# Patient Record
Sex: Female | Born: 1940 | Race: Black or African American | Hispanic: No | Smoking: Never smoker
Health system: Southern US, Community
[De-identification: ages and names within clinical notes are randomized; demographics above are authoritative.]

## PROBLEM LIST (undated history)

## (undated) DIAGNOSIS — R001 Bradycardia, unspecified: Secondary | ICD-10-CM

## (undated) DIAGNOSIS — M109 Gout, unspecified: Secondary | ICD-10-CM

## (undated) DIAGNOSIS — N189 Chronic kidney disease, unspecified: Secondary | ICD-10-CM

## (undated) DIAGNOSIS — Z950 Presence of cardiac pacemaker: Secondary | ICD-10-CM

## (undated) DIAGNOSIS — M1990 Unspecified osteoarthritis, unspecified site: Secondary | ICD-10-CM

## (undated) DIAGNOSIS — I10 Essential (primary) hypertension: Secondary | ICD-10-CM

## (undated) DIAGNOSIS — I509 Heart failure, unspecified: Secondary | ICD-10-CM

## (undated) HISTORY — PX: PACEMAKER INSERTION: SHX728

## (undated) HISTORY — PX: KNEE SURGERY: SHX244

## (undated) HISTORY — PX: ABDOMINAL HYSTERECTOMY: SHX81

---

## 2008-12-08 DIAGNOSIS — Z950 Presence of cardiac pacemaker: Secondary | ICD-10-CM

## 2008-12-08 HISTORY — DX: Presence of cardiac pacemaker: Z95.0

## 2008-12-08 HISTORY — PX: INSERT / REPLACE / REMOVE PACEMAKER: SUR710

## 2014-11-06 ENCOUNTER — Encounter (HOSPITAL_COMMUNITY): Payer: Self-pay | Admitting: Physician Assistant

## 2014-11-06 ENCOUNTER — Encounter (HOSPITAL_COMMUNITY)
Admission: AD | Disposition: A | Discharge status: Home / Self Care | Payer: Self-pay | Source: Other Acute Inpatient Hospital | Attending: Internal Medicine

## 2014-11-06 ENCOUNTER — Inpatient Hospital Stay (HOSPITAL_COMMUNITY)
Admission: AD | Admit: 2014-11-06 | Discharge: 2014-11-08 | DRG: 280 | Disposition: A | Discharge status: Home / Self Care | Payer: PRIVATE HEALTH INSURANCE | Source: Other Acute Inpatient Hospital | Attending: Internal Medicine | Admitting: Internal Medicine

## 2014-11-06 DIAGNOSIS — Z950 Presence of cardiac pacemaker: Secondary | ICD-10-CM | POA: Diagnosis not present

## 2014-11-06 DIAGNOSIS — M109 Gout, unspecified: Secondary | ICD-10-CM | POA: Diagnosis present

## 2014-11-06 DIAGNOSIS — I429 Cardiomyopathy, unspecified: Secondary | ICD-10-CM | POA: Diagnosis present

## 2014-11-06 DIAGNOSIS — Z8249 Family history of ischemic heart disease and other diseases of the circulatory system: Secondary | ICD-10-CM

## 2014-11-06 DIAGNOSIS — I214 Non-ST elevation (NSTEMI) myocardial infarction: Secondary | ICD-10-CM | POA: Diagnosis present

## 2014-11-06 DIAGNOSIS — I272 Other secondary pulmonary hypertension: Secondary | ICD-10-CM | POA: Diagnosis present

## 2014-11-06 DIAGNOSIS — R079 Chest pain, unspecified: Secondary | ICD-10-CM

## 2014-11-06 DIAGNOSIS — I509 Heart failure, unspecified: Secondary | ICD-10-CM

## 2014-11-06 DIAGNOSIS — I129 Hypertensive chronic kidney disease with stage 1 through stage 4 chronic kidney disease, or unspecified chronic kidney disease: Secondary | ICD-10-CM | POA: Diagnosis not present

## 2014-11-06 DIAGNOSIS — N183 Chronic kidney disease, stage 3 unspecified: Secondary | ICD-10-CM | POA: Diagnosis present

## 2014-11-06 DIAGNOSIS — I5021 Acute systolic (congestive) heart failure: Secondary | ICD-10-CM | POA: Diagnosis present

## 2014-11-06 DIAGNOSIS — I493 Ventricular premature depolarization: Secondary | ICD-10-CM | POA: Diagnosis present

## 2014-11-06 DIAGNOSIS — N182 Chronic kidney disease, stage 2 (mild): Secondary | ICD-10-CM

## 2014-11-06 DIAGNOSIS — N179 Acute kidney failure, unspecified: Secondary | ICD-10-CM | POA: Diagnosis present

## 2014-11-06 DIAGNOSIS — I10 Essential (primary) hypertension: Secondary | ICD-10-CM

## 2014-11-06 DIAGNOSIS — I2510 Atherosclerotic heart disease of native coronary artery without angina pectoris: Secondary | ICD-10-CM | POA: Clinically undetermined

## 2014-11-06 HISTORY — DX: Essential (primary) hypertension: I10

## 2014-11-06 HISTORY — DX: Unspecified osteoarthritis, unspecified site: M19.90

## 2014-11-06 HISTORY — DX: Gout, unspecified: M10.9

## 2014-11-06 HISTORY — DX: Presence of cardiac pacemaker: Z95.0

## 2014-11-06 HISTORY — DX: Chronic kidney disease, unspecified: N18.9

## 2014-11-06 HISTORY — DX: Bradycardia, unspecified: R00.1

## 2014-11-06 HISTORY — PX: LEFT HEART CATHETERIZATION WITH CORONARY ANGIOGRAM: SHX5451

## 2014-11-06 LAB — CBC WITH DIFFERENTIAL/PLATELET
BASOS ABS: 0 10*3/uL (ref 0.0–0.1)
Basophils Relative: 0 % (ref 0–1)
EOS PCT: 3 % (ref 0–5)
Eosinophils Absolute: 0.2 10*3/uL (ref 0.0–0.7)
HCT: 35 % — ABNORMAL LOW (ref 36.0–46.0)
HEMOGLOBIN: 11.6 g/dL — AB (ref 12.0–15.0)
LYMPHS ABS: 2.1 10*3/uL (ref 0.7–4.0)
Lymphocytes Relative: 35 % (ref 12–46)
MCH: 28.6 pg (ref 26.0–34.0)
MCHC: 33.1 g/dL (ref 30.0–36.0)
MCV: 86.4 fL (ref 78.0–100.0)
MONO ABS: 0.6 10*3/uL (ref 0.1–1.0)
MONOS PCT: 10 % (ref 3–12)
NEUTROS ABS: 3.1 10*3/uL (ref 1.7–7.7)
Neutrophils Relative %: 52 % (ref 43–77)
Platelets: 133 10*3/uL — ABNORMAL LOW (ref 150–400)
RBC: 4.05 MIL/uL (ref 3.87–5.11)
RDW: 14.3 % (ref 11.5–15.5)
WBC: 6.1 10*3/uL (ref 4.0–10.5)

## 2014-11-06 LAB — COMPREHENSIVE METABOLIC PANEL
ALT: 26 U/L (ref 0–35)
AST: 46 U/L — ABNORMAL HIGH (ref 0–37)
Albumin: 3.8 g/dL (ref 3.5–5.2)
Alkaline Phosphatase: 80 U/L (ref 39–117)
Anion gap: 17 — ABNORMAL HIGH (ref 5–15)
BILIRUBIN TOTAL: 0.8 mg/dL (ref 0.3–1.2)
BUN: 21 mg/dL (ref 6–23)
CALCIUM: 9.9 mg/dL (ref 8.4–10.5)
CHLORIDE: 98 meq/L (ref 96–112)
CO2: 23 meq/L (ref 19–32)
CREATININE: 1.09 mg/dL (ref 0.50–1.10)
GFR, EST AFRICAN AMERICAN: 56 mL/min — AB (ref >90)
GFR, EST NON AFRICAN AMERICAN: 48 mL/min — AB (ref >90)
GLUCOSE: 94 mg/dL (ref 70–99)
Potassium: 3.6 mEq/L — ABNORMAL LOW (ref 3.7–5.3)
Sodium: 138 mEq/L (ref 137–147)
Total Protein: 7.7 g/dL (ref 6.0–8.3)

## 2014-11-06 LAB — PROTIME-INR
INR: 1.26 (ref 0.00–1.49)
PROTHROMBIN TIME: 15.9 s — AB (ref 11.6–15.2)

## 2014-11-06 LAB — URINALYSIS, ROUTINE W REFLEX MICROSCOPIC
BILIRUBIN URINE: NEGATIVE
Glucose, UA: NEGATIVE mg/dL
KETONES UR: NEGATIVE mg/dL
Nitrite: NEGATIVE
PH: 7 (ref 5.0–8.0)
Protein, ur: NEGATIVE mg/dL
Specific Gravity, Urine: 1.01 (ref 1.005–1.030)
UROBILINOGEN UA: 0.2 mg/dL (ref 0.0–1.0)

## 2014-11-06 LAB — TSH: TSH: 0.542 u[IU]/mL (ref 0.350–4.500)

## 2014-11-06 LAB — TROPONIN I
Troponin I: 2.21 ng/mL (ref <0.30)
Troponin I: 4.28 ng/mL (ref <0.30)

## 2014-11-06 LAB — URINE MICROSCOPIC-ADD ON

## 2014-11-06 LAB — MRSA PCR SCREENING: MRSA BY PCR: NEGATIVE

## 2014-11-06 LAB — PRO B NATRIURETIC PEPTIDE: PRO B NATRI PEPTIDE: 2232 pg/mL — AB (ref 0–450)

## 2014-11-06 LAB — POCT ACTIVATED CLOTTING TIME: ACTIVATED CLOTTING TIME: 153 s

## 2014-11-06 SURGERY — LEFT HEART CATHETERIZATION WITH CORONARY ANGIOGRAM
Anesthesia: LOCAL

## 2014-11-06 MED ORDER — FENTANYL CITRATE 0.05 MG/ML IJ SOLN
INTRAMUSCULAR | Status: AC
  Filled 2014-11-06: qty 2

## 2014-11-06 MED ORDER — SODIUM CHLORIDE 0.9 % IJ SOLN: 3.0000 mL | INTRAMUSCULAR | Status: DC | PRN

## 2014-11-06 MED ORDER — SODIUM CHLORIDE 0.9 % IJ SOLN: 3.0000 mL | Freq: Two times a day (BID) | INTRAMUSCULAR | Status: DC

## 2014-11-06 MED ORDER — NITROGLYCERIN IN D5W 200-5 MCG/ML-% IV SOLN
0.0000 ug/min | INTRAVENOUS | Status: DC
  Administered 2014-11-06: 30 ug/min via INTRAVENOUS

## 2014-11-06 MED ORDER — NITROGLYCERIN 0.4 MG SL SUBL: 0.4000 mg | SUBLINGUAL_TABLET | SUBLINGUAL | Status: DC | PRN

## 2014-11-06 MED ORDER — SODIUM CHLORIDE 0.9 % IV SOLN
Freq: Once | INTRAVENOUS | Status: AC
  Administered 2014-11-06: 75 mL/h via INTRAVENOUS

## 2014-11-06 MED ORDER — ASPIRIN 81 MG PO CHEW: 81.0000 mg | CHEWABLE_TABLET | ORAL | Status: DC

## 2014-11-06 MED ORDER — ASPIRIN EC 81 MG PO TBEC: 81.0000 mg | DELAYED_RELEASE_TABLET | Freq: Every day | ORAL | Status: DC

## 2014-11-06 MED ORDER — ACETAMINOPHEN 325 MG PO TABS: 650.0000 mg | ORAL_TABLET | ORAL | Status: DC | PRN

## 2014-11-06 MED ORDER — ONDANSETRON HCL 4 MG/2ML IJ SOLN: 4.0000 mg | Freq: Four times a day (QID) | INTRAMUSCULAR | Status: DC | PRN

## 2014-11-06 MED ORDER — MIDAZOLAM HCL 2 MG/2ML IJ SOLN
INTRAMUSCULAR | Status: AC
  Filled 2014-11-06: qty 2

## 2014-11-06 MED ORDER — SODIUM CHLORIDE 0.9 % IV SOLN: INTRAVENOUS | Status: DC

## 2014-11-06 MED ORDER — HEPARIN (PORCINE) IN NACL 100-0.45 UNIT/ML-% IJ SOLN
1000.0000 [IU]/h | INTRAMUSCULAR | Status: DC
  Filled 2014-11-06: qty 250

## 2014-11-06 MED ORDER — ASPIRIN 300 MG RE SUPP
300.0000 mg | RECTAL | Status: AC
  Filled 2014-11-06: qty 1

## 2014-11-06 MED ORDER — ASPIRIN 81 MG PO CHEW
324.0000 mg | CHEWABLE_TABLET | ORAL | Status: AC
  Filled 2014-11-06: qty 4

## 2014-11-06 MED ORDER — ASPIRIN EC 81 MG PO TBEC
81.0000 mg | DELAYED_RELEASE_TABLET | Freq: Every day | ORAL | Status: DC
  Administered 2014-11-07 – 2014-11-08 (×2): 81 mg via ORAL
  Filled 2014-11-06 (×2): qty 1

## 2014-11-06 MED ORDER — NITROGLYCERIN IN D5W 200-5 MCG/ML-% IV SOLN: 0.0000 ug/min | INTRAVENOUS | Status: DC

## 2014-11-06 MED ORDER — ONDANSETRON HCL 4 MG/2ML IJ SOLN
4.0000 mg | Freq: Four times a day (QID) | INTRAMUSCULAR | Status: DC | PRN
Start: 2014-11-06 — End: 2014-11-06

## 2014-11-06 MED ORDER — HEPARIN (PORCINE) IN NACL 2-0.9 UNIT/ML-% IJ SOLN
INTRAMUSCULAR | Status: AC
  Filled 2014-11-06: qty 1500

## 2014-11-06 MED ORDER — SODIUM CHLORIDE 0.9 % IV SOLN: 250.0000 mL | INTRAVENOUS | Status: DC | PRN

## 2014-11-06 MED ORDER — LIDOCAINE HCL (PF) 1 % IJ SOLN
INTRAMUSCULAR | Status: AC
  Filled 2014-11-06: qty 30

## 2014-11-06 NOTE — Interval H&P Note (Signed)
Cath Lab Visit (complete for each Cath Lab visit)  Clinical Evaluation Leading to the Procedure:   ACS: Yes.    Non-ACS:    Anginal Classification: CCS IV  Anti-ischemic medical therapy: Maximal Therapy (2 or more classes of medications)  Non-Invasive Test Results: No non-invasive testing performed  Prior CABG: No previous CABG      History and Physical Interval Note:  11/06/2014 4:38 PM  Anita Wiley  has presented today for surgery, with the diagnosis of cp  The various methods of treatment have been discussed with the patient and family. After consideration of risks, benefits and other options for treatment, the patient has consented to  Procedure(s): LEFT HEART CATHETERIZATION WITH CORONARY ANGIOGRAM (N/A) as a surgical intervention .  The patient's history has been reviewed, patient examined, no change in status, stable for surgery.  I have reviewed the patient's chart and labs.  Questions were answered to the patient's satisfaction.     Anmol Paschen A

## 2014-11-06 NOTE — Progress Notes (Signed)
PA  From [facility] here to see and interview pt. Aware that pt is still having active chest pain rated a 4 on scale of 1 to 10.

## 2014-11-06 NOTE — Progress Notes (Signed)
DR Hilty here wanted NTG increased to 50 Mcg/ min for active CP mid sternal.

## 2014-11-06 NOTE — Progress Notes (Signed)
Risk and benefit of cardiac catheterization (bleeding, vascular/renal injury, arrhythmia, MI, stroke, loss of limb or life) discussed with patient who has agreed to proceed with the procedure.  Signed, Torrey Ballinas PA Pager: [phone]  

## 2014-11-06 NOTE — CV Procedure (Signed)
Anita Wiley is a 73 y.o. female    [id number]  [id number] LOCATION:  FACILITY: [facility]  PHYSICIAN: Lennette Biharihomas A. Gulianna Hornsby, MD, [facility] 10/13/1941   DATE OF PROCEDURE:  11/06/2014    CARDIAC CATHETERIZATION     HISTORY:    Anita Wiley is a 73 y.o. 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  The patient was [facility] after [facility] with substernal chest pain nausea and vomiting.  Initial troponin was mildly elevated at 0.3 at [facility].  Subsequent troponin at [facility] was 2.21 .  She is now referred for definitive cardiac catheterization.   PROCEDURE: Left heart catheterization: coronary angiography, left ventriculography   The patient was brought to [facility] cardiac catherization laboratory in the fasting state.  She was premedicated with Versed 1 mg and fentanyl 25 g. Her right groin was prepped and shaved in usual sterile fashion. Xylocaine 1% was used for local anesthesia. A 5 French sheath was inserted into the R femoral artery. Diagnostic catheterizatiion was done with 5 [place] FL5, FR4, and pigtail catheters. Left ventriculography was done with 25 cc Omnipaque contrast. Hemostasis was obtained by direct manual compression. The patient tolerated the procedure well.   HEMODYNAMICS:   Central Aorta:  144/59   Left Ventricle:  144/17  ANGIOGRAPHY:  Left main: Normal vessel which trifurcated into an LAD, ramus intermediate vessel, a left circumflex coronary artery.  LAD: Angiographically normal vessel which gave rise to one large proximal diagonal vessel and several septal perforating arteries.  The LAD extended to the LV apex.  Ramus Intermediate: Angiographically normal moderate sized vessel.  Left circumflex: Angiographically normal gave rise to a large bifurcating first marginal branch very proximally.  The AV groove circumflex was small caliber and gave rise to 2 small distal  branches.  Right coronary artery: Large dominant normal vessel which gave rise to a large PDA and posterolateral vessel as well as a large proximal branch.  Left ventriculography revealed normal to low normal LV function with an ejection fraction at 50-55%.  There were several PVCs.  No definitive wall motion abnormalities were demonstrated.  Probable left ventricular hypertrophy.   IMPRESSION:  Normal to low normal LV function with an ejection fraction at 50-55%.  There is evidence for probable left ventricular hypertrophy.  Normal coronary arteries.  RECOMMENDATION:   Medical therapy with optimal blood pressure control.  The patient arrived in the catheterization laboratory on 56 g of iv nitroglycerin.  This will be weaned and DC'd as BP allows with increasing medical therapy for blood pressure control.  Her mild troponin elevation most likely represents demand ischemia rather than a non-ST segment elevation myocardial infarction.  Lennette Biharihomas A. Magaret Justo, MD, [facility] 11/06/2014 5:27 PM

## 2014-11-06 NOTE — Progress Notes (Signed)
Text paged critical value to Azalee CourseHao Meng, [place] [facility] troponin was 2.21

## 2014-11-06 NOTE — Progress Notes (Addendum)
Pt in cath holding pre procedure.Tx by Rn with cardiac monitor. Pt reports cp at 3/10, improving from earlier, described as "Just a Little" O2 at 2L via [place] with 100 sat. Paced at 60bpm. IV NTG at 56.67 mcg/min, NS at 75 cc/h , heparin continues at 10cc/h.  1000U/H

## 2014-11-06 NOTE — H&P (Signed)
Patient ID: Anita Wiley [name]: [id number], DOB/AGE: 75/01/1941   Admit date: 11/06/2014   Primary Physician: No primary care provider on file. Primary Cardiologist: Dr. Tasia C[place]ingsAhmed in [place]  Pt. Profile:  73 year old African-American female with past medical history of hypertension, gout, history of bradycardia status post pacemaker placement in 2010 presented with persistent CP with elevated trop and CK-MB. Transfer[facility]  Problem List  Past Medical History  Diagnosis Date  . Hypertension   . Bradycardia     s/p pacemaker in 2010  . Chronic kidney disease   . Gout   . Pacemaker     Past Surgical History  Procedure Laterality Date  . Pacemaker insertion      2010  . Knee surgery    . Abdominal hysterectomy      at age 73     Allergies  Allergies  Allergen Reactions  . Codeine Rash    HPI  The patient is a 73 year old African-American female with past medical history of hypertension, gout, history of bradycardia status post pacemaker placement in 2010. According to the patient, she had a cardiac catheter[place]. She stated, she was pending knee surgery at that time, however was told there was something concerning about her EKG. She was later told her cardiac catheterization was fine at the time and underwent knee surgery the following year. She denies any prior history of heart attack. She received a pacemaker in 2010 for slow heart rate in the 30s. Her primary cardiologist have urged her several times to do a stress test, however she stated she could not walk on treadmill. It is unclear as this time whether or not she had a chemical stress test before as the patient is a poor historian. She states, her father died when she was 6 due to unclear illness. Her sister had coronary artery disease, however she is not sure how old her sister was when she had the first heart attack. She states she has not been very active at home. She  walks around with a walker, she has not noted any significant exertional symptom. She denies any heart failure symptoms include lower extremity edema, orthopnea or paroxysmal nocturnal dyspnea. There has been no fever, chill, or recent bleeding problem.  She woke up around midnight in the morning of 11/06/2014 with substernal/epigastric pain, nonradiating, nausea/vomiting, and diaphoresis. She also endorsed some mild shortness of breath as well. [facility], initial EKG showed nonspecific ST-T wave changes. She was also noted to have systolic blood pressure in the 190s. Laboratory finding include potassium of 3.3, BNP of 407, elevated troponin, CK-MB and total CK. Chest x-ray showed vascular congestion, cardiomegaly, bilateral PCP concerning for mild interstitial edema. Patient was subsequently [facility] as NSTEMI. At this time, patient continued to have 4/10 chest pain, which has been persistent since this morning.   Of note, she denies any bleeding or upcoming surgeries.   Home Medications  Prior to Admission medications   Not on File    Family History  Family History  Problem Relation Age of Onset  . Hypertension Mother   . CAD Sister     unkown age    Social History  History   Social History  . Marital Status: Widowed    Spouse Name: N/A    Number of Children: N/A  . Years of Education: N/A   Occupational History  . Not on file.   Social History  Main Topics  . Smoking status: Never Smoker   . Smokeless tobacco: Not on file  . Alcohol Use: No  . Drug Use: No  . Sexual Activity: Not on file   Other Topics Concern  . Not on file   Social History Narrative  . No narrative on file     Review of Systems General:  No chills, fever, night sweats or weight changes.  Cardiovascular:  No dyspnea on exertion, edema, orthopnea, palpitations, paroxysmal nocturnal dyspnea. + chest pain Dermatological: No rash,  lesions/masses Respiratory: No cough +dyspnea Urologic: No hematuria, dysuria Abdominal:   No nausea, vomiting, diarrhea, bright red blood per rectum, melena, or hematemesis Neurologic:  No visual changes, wkns, changes in mental status. All other systems reviewed and are otherwise negative except as noted above.  Physical Exam  Blood pressure 171/81, pulse 61, temperature 98.4 F (36.9 C), temperature source Oral, height 5\' 5"  (1.651 m), weight 183 lb 13.8 oz (83.4 kg), SpO2 100 %.  General: Pleasant, NAD Psych: Normal affect. Neuro: Alert and oriented X 3. Moves all extremities spontaneously. HEENT: Normal  Neck: Supple without bruits or JVD. Lungs:  Resp regular and unlabored, decreased breath sound in bilateral bases Heart: RRR no s3, s4, or murmurs. Abdomen: Soft, non-tender, non-distended, BS + x 4.  Extremities: No clubbing, cyanosis or edema. DP/PT/Radials 2+ and equal bilaterally.  Labs   ECG  Pending   ASSESSMENT AND PLAN  1. NSTEMI  - [place], no intervention  - order EKG, trend trop. Continue IV heparin and nitro gtt  - will arrange cardiac cath today as patient has been NPO  2. Possible HF  - [facility]  2. HTN: uncontrolled  - possibly related to CP  3. Gout 4. CKD, stage III 5. H/o bradycardia s/p pacemaker placement 2010  - [employer]  Signed, Azalee CourseMeng, Jovana Rembold, PA-C 11/06/2014, 11:27 AM

## 2014-11-06 NOTE — Progress Notes (Signed)
ANTICOAGULATION CONSULT NOTE - Initial Consult  Pharmacy Consult for heparin Indication: chest pain/ACS  Allergies  Allergen Reactions  . Codeine Rash    Patient Measurements: Height: 5\' 5"  (165.1 cm) Weight: 183 lb 13.8 oz (83.4 kg) IBW/kg (Calculated) : 57 Heparin Dosing Weight: 67.6 kg  Vital Signs: Temp: 98.4 F (36.9 C) (11/30 1039) Temp Source: Oral (11/30 1039) BP: 171/81 mmHg (11/30 1039) Pulse Rate: 61 (11/30 1039)  Labs: No results for input(s): HGB, HCT, PLT, APTT, LABPROT, INR, HEPARINUNFRC, CREATININE, CKTOTAL, CKMB, TROPONINI in the last 72 hours.  CrCl cannot be calculated (Patient has no serum creatinine result on file.).   Medical History: Past Medical History  Diagnosis Date  . Hypertension   . Bradycardia     s/p pacemaker in 2010  . Chronic kidney disease   . Gout   . Pacemaker     Medications:  Prescriptions prior to admission  Medication Sig Dispense Refill Last Dose  . allopurinol (ZYLOPRIM) 300 MG tablet Take 300 mg by mouth daily.   11/05/2014 at Unknown time  . aspirin 81 MG tablet Take 81 mg by mouth daily.   11/05/2014 at Unknown time  . valsartan-hydrochlorothiazide (DIOVAN-HCT) 160-25 MG per tablet Take 1 tablet by mouth daily.   11/05/2014 at Unknown time    Assessment: 73 yo F [facility] with [facility].  [facility] with N/V and epigastric pain.  Initial EKG showed nonspecific ST-T wave changes. She has elevated troponin, CK-MB and total CK. Her stated wt is 87 kg from MD visit in September and her measured wt is 83.4 kg.  Ht 65 inches.  Baseline INR 1.1, creat 1.12, H/H 11.2/35.6.  Her platelet count is low at 125.  No bleeding reported.  She was started on a heparin drip at 1000 units/hr [facility] at 0730 am today.  I did not see a bolus dose in the [facility] records.    Goal of Therapy:  Heparin level 0.3-0.7 units/ml Monitor platelets by anticoagulation protocol: Yes   Plan:  -continue heparin drip at  1000 units/hr as [facility] and check HL 8 hrs after started, which will be at 1530. -daily HL and CBC while on heparin -f/u after cath  Herby AbrahamMichelle T. Jourdan Durbin, Pharm.D. [phone] 11/06/2014 11:48 AM

## 2014-11-06 NOTE — Progress Notes (Signed)
To Cath Lab procedure 5

## 2014-11-06 NOTE — Plan of Care (Signed)
Problem: Consults Goal: Chest Pain Patient Education (See Patient Education module for education specifics.)  Outcome: Completed/Met Date Met:  11/06/14  Problem: Phase I Progression Outcomes Goal: Aspirin unless contraindicated Outcome: Completed/Met Date Met:  11/06/14 Goal: MD aware of Cardiac Marker results Outcome: Completed/Met Date Met:  11/06/14 Goal: Voiding-avoid urinary catheter unless indicated Outcome: Completed/Met Date Met:  11/06/14

## 2014-11-06 NOTE — Progress Notes (Signed)
[facility] Ntg gtt at 30mg /mion Heparin at 1,000 u /hr via Lt fa IV. Alert/oriented C/o chest pain on scale of 1 to 10 a 4 CHG and MRSA swab done

## 2014-11-06 NOTE — Progress Notes (Signed)
Utilization Review Completed.  

## 2014-11-06 NOTE — H&P (View-Only) (Signed)
Risk and benefit of cardiac catheterization (bleeding, vascular/renal injury, arrhythmia, MI, stroke, loss of limb or life) discussed with patient who has agreed to proceed with the procedure.  Ramond DialSigned, Drey Shaff PA Pager: [phone]

## 2014-11-06 NOTE — Progress Notes (Signed)
Site area: RFA Site Prior to Removal:  Level 0 Pressure Applied For:2125min Manual: y   Patient Status During Pull:  stable Post Pull Site:  Level 0 Post Pull Instructions Given:  yes Post Pull Pulses Present: palpable Dressing Applied:  clear Bedrest begins @ 1800 Comments:no complications

## 2014-11-06 NOTE — Plan of Care (Signed)
Problem: Phase II Progression Outcomes Goal: Cath/PCI Day Path if indicated Outcome: Completed/Met Date Met:  11/06/14

## 2014-11-06 NOTE — Plan of Care (Signed)
Problem: Consults Goal: Cardiac Cath Patient Education (See Patient Education module for education specifics.)  Outcome: Completed/Met Date Met:  11/06/14     

## 2014-11-07 ENCOUNTER — Inpatient Hospital Stay (HOSPITAL_COMMUNITY): Payer: PRIVATE HEALTH INSURANCE

## 2014-11-07 DIAGNOSIS — I369 Nonrheumatic tricuspid valve disorder, unspecified: Secondary | ICD-10-CM

## 2014-11-07 DIAGNOSIS — I2510 Atherosclerotic heart disease of native coronary artery without angina pectoris: Secondary | ICD-10-CM

## 2014-11-07 LAB — LIPID PANEL
CHOL/HDL RATIO: 2.7 ratio
Cholesterol: 151 mg/dL (ref 0–200)
HDL: 56 mg/dL (ref >39)
LDL CALC: 84 mg/dL (ref 0–99)
Triglycerides: 55 mg/dL (ref <150)
VLDL: 11 mg/dL (ref 0–40)

## 2014-11-07 LAB — CBC
HCT: 30.6 % — ABNORMAL LOW (ref 36.0–46.0)
Hemoglobin: 9.7 g/dL — ABNORMAL LOW (ref 12.0–15.0)
MCH: 27.6 pg (ref 26.0–34.0)
MCHC: 31.7 g/dL (ref 30.0–36.0)
MCV: 86.9 fL (ref 78.0–100.0)
PLATELETS: 123 10*3/uL — AB (ref 150–400)
RBC: 3.52 MIL/uL — AB (ref 3.87–5.11)
RDW: 14.5 % (ref 11.5–15.5)
WBC: 6.1 10*3/uL (ref 4.0–10.5)

## 2014-11-07 LAB — BASIC METABOLIC PANEL
ANION GAP: 13 (ref 5–15)
BUN: 21 mg/dL (ref 6–23)
CALCIUM: 9.2 mg/dL (ref 8.4–10.5)
CO2: 25 mEq/L (ref 19–32)
Chloride: 102 mEq/L (ref 96–112)
Creatinine, Ser: 1.19 mg/dL — ABNORMAL HIGH (ref 0.50–1.10)
GFR, EST AFRICAN AMERICAN: 50 mL/min — AB (ref >90)
GFR, EST NON AFRICAN AMERICAN: 44 mL/min — AB (ref >90)
Glucose, Bld: 110 mg/dL — ABNORMAL HIGH (ref 70–99)
POTASSIUM: 3.6 meq/L — AB (ref 3.7–5.3)
SODIUM: 140 meq/L (ref 137–147)

## 2014-11-07 LAB — TROPONIN I: Troponin I: 4.56 ng/mL (ref <0.30)

## 2014-11-07 MED FILL — Nitroglycerin IV Soln 200 MCG/ML in D5W: INTRAVENOUS | Qty: 250 | Status: AC

## 2014-11-07 MED FILL — Heparin Sodium (Porcine) 100 Unt/ML in Sodium Chloride 0.45%: INTRAMUSCULAR | Qty: 250 | Status: AC

## 2014-11-07 NOTE — Plan of Care (Signed)
Problem: Phase I Progression Outcomes Goal: Distal pulses equal to baseline Outcome: Completed/Met Date Met:  11/07/14 Goal: Vascular site scale level 0 - I Vascular Site Scale Level 0: No bruising/bleeding/hematoma Level I (Mild): Bruising/Ecchymosis, minimal bleeding/ooozing, palpable hematoma < 3 cm Level II (Moderate): Bleeding not affecting hemodynamic parameters, pseudoaneurysm, palpable hematoma > 3 cm Level III (Severe) Bleeding which affects hemodynamic parameters or retroperitoneal hemorrhage  Outcome: Completed/Met Date Met:  11/07/14     

## 2014-11-07 NOTE — Progress Notes (Addendum)
Patient Name: Anita Wiley Date of Encounter: 11/07/2014     Principal Problem:   NSTEMI (non-ST elevated myocardial infarction) Active Problems:   S/P placement of cardiac pacemaker   HTN (hypertension), malignant   CKD (chronic kidney disease) stage 2, GFR 60-89 ml/min   Coronary artery disease, non-occlusive by cath    SUBJECTIVE  Patient feeling well. No CP or SOB.   CURRENT MEDS . aspirin  324 mg Oral NOW   Or  . aspirin  300 mg Rectal NOW  . aspirin EC  81 mg Oral Daily    OBJECTIVE  Filed Vitals:   11/06/14 2230 11/07/14 0000 11/07/14 0100 11/07/14 0647  BP: 131/73 137/50 124/52 135/63  Pulse:  63  67  Temp:  98.3 F (36.8 C)  98.4 F (36.9 C)  TempSrc:  Oral  Oral  Resp:  16  20  Height:      Weight:  185 lb 3 oz (84 kg)    SpO2:  96%  95%    Intake/Output Summary (Last 24 hours) at 11/07/14 0721 Last data filed at 11/07/14 0253  Gross per 24 hour  Intake   1048 ml  Output    780 ml  Net    268 ml   Filed Weights   11/06/14 1039 11/07/14 0000  Weight: 183 lb 13.8 oz (83.4 kg) 185 lb 3 oz (84 kg)    PHYSICAL EXAM  General: Pleasant, NAD. Neuro: Alert and oriented X 3. Moves all extremities spontaneously. Psych: Normal affect. HEENT:  Normal  Neck: Supple without bruits or JVD. Lungs:  Decreased breath sounds at bases. Heart: RRR no s3, s4, or murmurs. Abdomen: Soft, non-tender, non-distended, BS + x 4.  Extremities: No clubbing, cyanosis or edema. DP/PT/Radials 2+ and equal bilaterally.  Accessory Clinical Findings  CBC  Recent Labs  11/06/14 1255 11/07/14 0330  WBC 6.1 6.1  NEUTROABS 3.1  --   HGB 11.6* 9.7*  HCT 35.0* 30.6*  MCV 86.4 86.9  PLT 133* 123*   Basic Metabolic Panel  Recent Labs  11/06/14 1255 11/07/14 0330  NA 138 140  K 3.6* 3.6*  CL 98 102  CO2 23 25  GLUCOSE 94 110*  BUN 21 21  CREATININE 1.09 1.19*  CALCIUM 9.9 9.2   Liver Function Tests  Recent Labs  11/06/14 1255  AST 46*  ALT 26    ALKPHOS 80  BILITOT 0.8  PROT 7.7  ALBUMIN 3.8    Cardiac Enzymes  Recent Labs  11/06/14 1255 11/06/14 1939 11/06/14 2304  TROPONINI 2.21* 4.28* 4.56*    Fasting Lipid Panel  Recent Labs  11/07/14 0330  CHOL 151  HDL 56  LDLCALC 84  TRIG 55  CHOLHDL 2.7   Thyroid Function Tests  Recent Labs  11/06/14 1255  TSH 0.542    TELE  AV Paced   Radiology/Studies  [facility] 11/06/14 IMPRESSION: Normal to low normal LV function with an ejection fraction at 50-55%. There is evidence for probable left ventricular hypertrophy. Normal coronary arteries. RECOMMENDATION:  Medical therapy with optimal blood pressure control. The patient arrived in the catheterization laboratory on 56 g of iv nitroglycerin. This will be weaned and DC'd as BP allows with increasing medical therapy for blood pressure control. Her mild troponin elevation most likely represents demand ischemia rather than a non-ST segment elevation myocardial infarction.   ASSESSMENT AND PLAN  Anita Wiley is a 73 y.o. female with a history of hypertension, gout, history of bradycardia s/p PPM  2010 presented who [facility] on 11/06/14 for persistent CP with elevated trop.  Elevated troponin/demand ischemia-  -- Troponin 2.21--> 4.28--> 4.56 -- s/p LHC on 11/06/14 with normal coronary arteries; low normal LV function (EF 50-55%) and LVH. Medical therapy with optimal BP controlled recommended and elevated troponin thought to be demand ischemia vs NSTEMI.   CHF- BNP 2.2K, [facility] with pulm vasc congestion.  -- EF low-normal per LV gram on cath (EF 50-55%) -- No s/s volume overload. Elevated BNP. Will proceed with 2D ECHO and CXR today   Hx of bradycardia- s/p Metronic pacemaker   HTN- takes valsartan- HCTZ at home.  -- Will hold post cath, creat mildly bumped 1.09--> 1.19 -- BP controlled currently  Signed, Janetta HoraHOMPSON, KATHRYN R PA-C  Pager [phone]  I have seen  and evaluated the patient this AM along with Janee MornHOMPSON, KATHRYN R PA-C.  We reviewed the chart & all available data.  I agree with her findings, examination as well as impression recommendations.  Admitted with CP & SOB - ruled in for NSTEMI, but angiographically no significant disease.  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 Demand Ischemia mediated infarction.  Needs additional BP & CRF management including Echo with assessment of Diastolic function.  Hold off on titrating BP meds until renal Fxn stable post-cath.   Marykay LexHARDING,DAVID W, M.D., M.S. Interventional Cardiologist   Pager # [phone]

## 2014-11-07 NOTE — Progress Notes (Signed)
  Echocardiogram 2D Echocardiogram has been performed.  Anita Wiley 11/07/2014, 11:38 AM

## 2014-11-07 NOTE — Plan of Care (Signed)
Problem: Phase II Progression Outcomes Goal: Hemodynamically stable Outcome: Completed/Met Date Met:  11/07/14 Goal: Anginal pain relieved Outcome: Completed/Met Date Met:  11/07/14 Goal: Stress Test if indicated Outcome: Not Applicable Date Met:  94/76/54 Goal: CV Risk Factors identified Outcome: Completed/Met Date Met:  11/07/14 Goal: Cardiac Rehab if ordered Outcome: Not Applicable Date Met:  65/03/54 Goal: If positive for MI, change to MI Path Outcome: Not Applicable Date Met:  65/68/12

## 2014-11-07 NOTE — Plan of Care (Signed)
Problem: Phase II Progression Outcomes Goal: Ambulates up to 600 ft. in hall x 1 Outcome: Completed/Met Date Met:  11/07/14

## 2014-11-07 NOTE — Plan of Care (Signed)
Problem: Phase III Progression Outcomes Goal: Hemodynamically stable Outcome: Completed/Met Date Met:  11/07/14 Goal: No anginal pain Outcome: Completed/Met Date Met:  11/07/14 Goal: Cath/PCI Path as indicated Outcome: Completed/Met Date Met:  11/07/14 Goal: Vascular site scale level 0 - I Vascular Site Scale Level 0: No bruising/bleeding/hematoma Level I (Mild): Bruising/Ecchymosis, minimal bleeding/ooozing, palpable hematoma < 3 cm Level II (Moderate): Bleeding not affecting hemodynamic parameters, pseudoaneurysm, palpable hematoma > 3 cm Level III (Severe) Bleeding which affects hemodynamic parameters or retroperitoneal hemorrhage  Outcome: Completed/Met Date Met:  11/07/14 Goal: Discharge plan remains appropriate-arrangements made Outcome: Completed/Met Date Met:  11/07/14 Goal: Tolerating diet Outcome: Completed/Met Date Met:  11/07/14 Goal: If positive for MI, change to MI Path Outcome: Not Applicable Date Met:  01/56/15

## 2014-11-07 NOTE — Plan of Care (Signed)
Problem: Phase I Progression Outcomes Goal: Post Cath/PCI return to appropriate Path Outcome: Completed/Met Date Met:  11/07/14

## 2014-11-07 NOTE — Plan of Care (Signed)
Problem: Phase II Progression Outcomes Goal: Discharge plan in place and appropriate Outcome: Completed/Met Date Met:  11/07/14 Goal: Hemodynamically stable Outcome: Completed/Met Date Met:  37/35/78 Goal: Complications resolved/controlled Outcome: Completed/Met Date Met:  11/07/14 Goal: Activity appropriate for discharge plan Outcome: Completed/Met Date Met:  11/07/14 Goal: Vascular site scale level 0 - I Vascular Site Scale Level 0: No bruising/bleeding/hematoma Level I (Mild): Bruising/Ecchymosis, minimal bleeding/ooozing, palpable hematoma < 3 cm Level II (Moderate): Bleeding not affecting hemodynamic parameters, pseudoaneurysm, palpable hematoma > 3 cm Level III (Severe) Bleeding which affects hemodynamic parameters or retroperitoneal hemorrhage  Outcome: Completed/Met Date Met:  11/07/14

## 2014-11-08 ENCOUNTER — Encounter (HOSPITAL_COMMUNITY): Payer: Self-pay | Admitting: Physician Assistant

## 2014-11-08 DIAGNOSIS — M109 Gout, unspecified: Secondary | ICD-10-CM | POA: Diagnosis present

## 2014-11-08 LAB — CBC
HCT: 31 % — ABNORMAL LOW (ref 36.0–46.0)
Hemoglobin: 10.1 g/dL — ABNORMAL LOW (ref 12.0–15.0)
MCH: 28.9 pg (ref 26.0–34.0)
MCHC: 32.6 g/dL (ref 30.0–36.0)
MCV: 88.8 fL (ref 78.0–100.0)
PLATELETS: 110 10*3/uL — AB (ref 150–400)
RBC: 3.49 MIL/uL — ABNORMAL LOW (ref 3.87–5.11)
RDW: 14.5 % (ref 11.5–15.5)
WBC: 8.2 10*3/uL (ref 4.0–10.5)

## 2014-11-08 LAB — BASIC METABOLIC PANEL
ANION GAP: 10 (ref 5–15)
BUN: 18 mg/dL (ref 6–23)
CHLORIDE: 102 meq/L (ref 96–112)
CO2: 28 mEq/L (ref 19–32)
CREATININE: 1.15 mg/dL — AB (ref 0.50–1.10)
Calcium: 9.2 mg/dL (ref 8.4–10.5)
GFR calc non Af Amer: 45 mL/min — ABNORMAL LOW (ref >90)
GFR, EST AFRICAN AMERICAN: 53 mL/min — AB (ref >90)
Glucose, Bld: 100 mg/dL — ABNORMAL HIGH (ref 70–99)
Potassium: 3.9 mEq/L (ref 3.7–5.3)
Sodium: 140 mEq/L (ref 137–147)

## 2014-11-08 MED ORDER — HYDRALAZINE HCL 25 MG PO TABS
25.0000 mg | ORAL_TABLET | Freq: Three times a day (TID) | ORAL | Status: DC
  Filled 2014-11-08 (×4): qty 1

## 2014-11-08 MED ORDER — CARVEDILOL 6.25 MG PO TABS: 6.2500 mg | ORAL_TABLET | Freq: Two times a day (BID) | ORAL | Status: DC

## 2014-11-08 MED ORDER — CARVEDILOL 6.25 MG PO TABS
6.2500 mg | ORAL_TABLET | Freq: Two times a day (BID) | ORAL | Status: DC
  Administered 2014-11-08: 10:00:00 6.25 mg via ORAL
  Filled 2014-11-08 (×3): qty 1

## 2014-11-08 MED ORDER — FUROSEMIDE 10 MG/ML IJ SOLN: 40.0000 mg | Freq: Once | INTRAMUSCULAR | Status: DC

## 2014-11-08 NOTE — Progress Notes (Signed)
Patient Name: Anita Wiley Date of Encounter: 11/08/2014     Principal Problem:   NSTEMI (non-ST elevated myocardial infarction) Active Problems:   S/P placement of cardiac pacemaker   HTN (hypertension), malignant   CKD (chronic kidney disease) stage 2, GFR 60-89 ml/min   Coronary artery disease, non-occlusive by cath    SUBJECTIVE  Patient feeling well. No CP, SOB or orthopnea. However, sleeping on two pillows with bed on an incline.   CURRENT MEDS . aspirin EC  81 mg Oral Daily    OBJECTIVE  Filed Vitals:   11/07/14 2025 11/07/14 2333 11/08/14 0446 11/08/14 0725  BP: 136/54 165/71 167/55 172/65  Pulse: 69  64   Temp: 98.9 F (37.2 C) 98.5 F (36.9 C) 98.6 F (37 C)   TempSrc: Oral  Oral   Resp: 18 20 20 20   Height:      Weight:   186 lb 8.2 oz (84.6 kg)   SpO2: 96% 96% 96%     Intake/Output Summary (Last 24 hours) at 11/08/14 0813 Last data filed at 11/07/14 2340  Gross per 24 hour  Intake    480 ml  Output    350 ml  Net    130 ml   Filed Weights   11/06/14 1039 11/07/14 0000 11/08/14 0446  Weight: 183 lb 13.8 oz (83.4 kg) 185 lb 3 oz (84 kg) 186 lb 8.2 oz (84.6 kg)    PHYSICAL EXAM  General: Pleasant, NAD. Neuro: Alert and oriented X 3. Moves all extremities spontaneously. Psych: Normal affect. HEENT:  Normal  Neck: Supple without bruits or JVD. Lungs:  Decreased breath sounds at bases. Heart: RRR no s3, s4, + murmurs. Abdomen: Soft, non-tender, non-distended, BS + x 4.  Extremities: No clubbing, cyanosis or edema. DP/PT/Radials 2+ and equal bilaterally.  Accessory Clinical Findings  CBC  Recent Labs  11/06/14 1255 11/07/14 0330 11/08/14 0434  WBC 6.1 6.1 8.2  NEUTROABS 3.1  --   --   HGB 11.6* 9.7* 10.1*  HCT 35.0* 30.6* 31.0*  MCV 86.4 86.9 88.8  PLT 133* 123* 110*   Basic Metabolic Panel  Recent Labs  11/06/14 1255 11/07/14 0330  NA 138 140  K 3.6* 3.6*  CL 98 102  CO2 23 25  GLUCOSE 94 110*  BUN 21 21  CREATININE  1.09 1.19*  CALCIUM 9.9 9.2   Liver Function Tests  Recent Labs  11/06/14 1255  AST 46*  ALT 26  ALKPHOS 80  BILITOT 0.8  PROT 7.7  ALBUMIN 3.8    Cardiac Enzymes  Recent Labs  11/06/14 1255 11/06/14 1939 11/06/14 2304  TROPONINI 2.21* 4.28* 4.56*    Fasting Lipid Panel  Recent Labs  11/07/14 0330  CHOL 151  HDL 56  LDLCALC 84  TRIG 55  CHOLHDL 2.7   Thyroid Function Tests  Recent Labs  11/06/14 1255  TSH 0.542    TELE  AV Paced with frequent PVCs  Radiology/Studies  LHC 11/06/14 IMPRESSION: Normal to low normal LV function with an ejection fraction at 50-55%. There is evidence for probable left ventricular hypertrophy. Normal coronary arteries. RECOMMENDATION:  Medical therapy with optimal blood pressure control. The patient arrived in the catheterization laboratory on 56 g of iv nitroglycerin. This will be weaned and DC'd as BP allows with increasing medical therapy for blood pressure control. Her mild troponin elevation most likely represents demand ischemia rather than a non-ST segment elevation myocardial infarction.   ASSESSMENT AND PLAN  Anita Wiley is  a 73 y.o. female with a history of hypertension, gout, history of bradycardia s/p PPM 2010 who [facility]14 for persistent CP with elevated trop.  Elevated troponin/demand ischemia-  -- Troponin 2.21--> 4.28--> 4.56 -- Admitted with CP & SOB - ruled in for NSTEMI, but angiographically no significant disease.  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 Demand Ischemia mediated infarction.  Acute systolic CHF/pulmonary HTN- BNP 2.2K, [facility] with pulm vasc congestion. Repeat CXR here shows pulm vascular congestion and cardiomegaly -- EF low-normal per LV gram on cath (EF 50-55%). -- BNP elevated. 2D ECHO yesterday with EF 40-45%, severe LVH, normal LV diastolic function. Mild AR. Mod MR. Severe TR. Mod  PR. PA pk pressure 65mmg Hg. -- Patient is not SOB and has no s/s volume overload. She may need some lasix. WIll give 40mg  IV Lasix now. MD to see.  -- I have added carvedilol today and will resume home valsartan-HCTZ if kidney function stable.  Hx of bradycardia- s/p Metronic pacemaker   HTN- takes valsartan- HCTZ at home.  -- Will hold post cath, creat mildly bumped 1.09--> 1.19. BMET today pending. Resume home meds if creat stable. -- BP uncontrolled currently.  Will add Carvedilol now in the setting of mild cardiomyopathy   Dispo- she is followed by Dr. Tasia CatchingsAhmed in [place].   Billy FischerSigned, THOMPSON, KATHRYN R PA-C Pager [phone] Patient seen and examined. I agree with the assessment and plan as detailed above. See also my additional thoughts below.   I agree completely with the full note as outlined by Ms. Thompson above. The patient is ready to go home. It will be important for her to have early post hospital follow-up with her doctor in [place].  Willa RoughJeffrey Garon Melander, MD, [facility] 11/08/2014 9:38 AM

## 2014-11-08 NOTE — Discharge Instructions (Signed)

## 2014-11-08 NOTE — Care Management Note (Signed)
    Page 1 of 1   11/08/2014     9:36:14 AM CARE MANAGEMENT NOTE 11/08/2014  Patient:  Anita Wiley,Anita Wiley   Account Number:  [id number]  Date Initiated:  11/08/2014  Documentation initiated by:  GRAVES-BIGELOW,Jeannifer Drakeford  Subjective/Objective Assessment:   Pt admitted for NStemi.     Action/Plan:   DME rolling walker will be delivered to pt's room before [facility].   Anticipated DC Date:  11/09/2014   Anticipated DC Plan:  HOME/SELF CARE      DC Planning Services  CM consult      PAC Choice  DURABLE MEDICAL EQUIPMENT   Choice offered to / List presented to:     DME arranged  [facility]      DME agency  [facility]        Status of service:  Completed, signed off Medicare Important Message given?   (If response is "NO", the following Medicare IM given date fields will be blank) Date Medicare IM given:   Medicare IM given by:   Date Additional Medicare IM given:   Additional Medicare IM given by:    Discharge Disposition:  HOME/SELF CARE  Per UR Regulation:  Reviewed for med. necessity/level of care/duration of stay  If discussed at Long Length of Stay Meetings, dates discussed:    Comments:

## 2014-11-08 NOTE — Discharge Summary (Signed)
Discharge Summary   Patient ID: Anita Wiley [name]: [id number], DOB/AGE: [birth year] 73 y.o. Admit date: 11/06/2014 D/C date:     11/08/2014  Primary Cardiologist: Dr. Tasia Catchings in [place], [place]  Principal Problem:   NSTEMI (non-ST elevated myocardial infarction) Active Problems:   S/P placement of cardiac pacemaker   HTN (hypertension), malignant   CKD (chronic kidney disease) stage 2, GFR 60-89 ml/min   Coronary artery disease, non-occlusive by cath   Gout    Admission Dates: 11/06/14-11/08/14 Discharge Diagnosis: NSTEMI s/p LHC with no significant disease. Likely due to uncontrolled HTN or acute CHF  HPI: Anita Wiley is a 73 y.o. female with a history of hypertension, gout, history of bradycardia s/p PPM 2010 who was transferred to [facility] from [facility] on 11/06/14 for persistent CP with elevated trop.   Hospital Course  Elevated troponin/demand ischemia-  -- Troponin 2.21--> 4.28--> 4.56 -- Admitted with CP & SOB - ruled in for NSTEMI, but angiographically no significant disease. Likely Malignant (accelerated) HTN related/ CHF leading to Type 2 Supply vs. Demand Ischemia mediated infarction.  Acute systolic CHF/pulmonary HTN- BNP 2.2K, CXR at [facility] with pulm vasc congestion. Repeat CXR here shows pulm vascular congestion and cardiomegaly -- EF low-normal per LV gram on cath (EF 50-55%). -- BNP elevated. 2D ECHO yesterday with EF 40-45%, severe LVH, normal LV diastolic function. Mild AR. Mod MR. Severe TR. Mod PR. PA pk pressure Hg. -- Patient is not SOB and has no s/s volume overload.Will not diurese with no symptoms or evidence of volume overload. Will discharge today with close follow up in her regular cardiology clinic -- I have added carvedilol today and will resume home valsartan-HCTZ as her kidney function is stable.  Hx of bradycardia- s/p Metronic pacemaker   HTN- takes valsartan- HCTZ at home.  -- Creat mildly bumped post cath 1.09--> 1.19. Now resolving.  Okay to resume home Diovan-HCt -- Will add Carvedilol now in the setting of mild cardiomyopathy (EF 40-45% by ECHO)  The patient has had an uncomplicated hospital course and is recovering well. The femoral catheter site is stable. She has been seen by Dr. Myrtis Ser today and deemed ready for discharge home. All follow-up appointments have been scheduled. I have arranged for her to follow up with her primary cardiologist, Dr Tasia Catchings, in [place] on Friday at 10am. I will have this discharge summary (which includes ECHO and cath report) to his office. Discharge medications are listed below.   Discharge Vitals: Blood pressure 172/65, pulse 68, temperature 98.5 F (36.9 C), temperature source Oral, resp. rate 20, height 5\' 5"  (1.651 m), weight 186 lb 8.2 oz (84.6 kg), SpO2 96 %.  Labs: Lab Results  Component Value Date   WBC 8.2 11/08/2014   HGB 10.1* 11/08/2014   HCT 31.0* 11/08/2014   MCV 88.8 11/08/2014   PLT 110* 11/08/2014    Recent Labs Lab 11/06/14 1255  11/08/14 0434  NA 138  < > 140  K 3.6*  < > 3.9  CL 98  < > 102  CO2 23  < > 28  BUN 21  < > 18  CREATININE 1.09  < > 1.15*  CALCIUM 9.9  < > 9.2  PROT 7.7  --   --   BILITOT 0.8  --   --   ALKPHOS 80  --   --   ALT 26  --   --   AST 46*  --   --   GLUCOSE 94  < >  100*  < > = values in this interval not displayed.  Recent Labs  11/06/14 1255 11/06/14 1939 11/06/14 2304  TROPONINI 2.21* 4.28* 4.56*   Lab Results  Component Value Date   CHOL 151 11/07/2014   HDL 56 11/07/2014   LDLCALC 84 11/07/2014   TRIG 55 11/07/2014   No results found for: DDIMER  Diagnostic Studies/Procedures   Dg Chest 2 View  11/07/2014   CLINICAL DATA:  Chest pain and vomiting.  CHF.  EXAM: CHEST  2 VIEW  COMPARISON:  11/06/2014.  FINDINGS: Cardiomegaly and pulmonary vascular congestion. Unchanged 2 lead LEFT subclavian cardiac pacemaker. No airspace disease. Monitoring leads project over the chest. Chronic bilateral rotator cuff tears.   Bilateral hilar enlargement appears similar to prior exam 11/06/2014 and is probably due to pulmonary vascular congestion rather than adenopathy.  IMPRESSION: Cardiomegaly and pulmonary vascular congestion.   Electronically Signed   By: Andreas NewportGeoffrey  Lamke M.D.   On: 11/07/2014 18:07      CARDIAC CATHETERIZATION    HISTORY:  Anita Wiley is a 73 y.o. 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 The patient [facility] with substernal chest pain nausea and vomiting. Initial trop[facility] mildly [facility].3 [facility]. [facility] was 2.21 . She is now referred for definitive cardiac catheterization PROCEDURE: Left heart catheterization: coronary angiography, left ventriculography  [facility] in the fasting state. She was premedicated with Versed 1 mg and fentanyl 25 g. Her right groin was prepped and shaved in usual sterile fashion. Xylocaine 1% was used for local anesthesia. A 5 French sheath was inserted into the R femoral artery. Diagnostic catheterizatiion was done with 5 [place]French FL5, FR4, and pigtail catheters. Left ventriculography was done with 25 cc Omnipaque contrast. Hemostasis was obtained by direct manual compression. The patient tolerated the procedure well. HEMODYNAMICS: Central Aorta: 144/59 Left Ventricle: 144/17 ANGIOGRAPHY: Left main: Normal vessel which trifurcated into an LAD, ramus intermediate vessel, a left circumflex coronary artery. LAD: Angiographically normal vessel which gave rise to one large proximal diagonal vessel and several septal perforating arteries. The LAD extended to the LV apex. Ramus Intermediate: Angiographically normal moderate sized vessel. Left circumflex: Angiographically normal gave rise to a large bifurcating first marginal branch very  proximally. The AV groove circumflex was small caliber and gave rise to 2 small distal branches. Right coronary artery: Large dominant normal vessel which gave rise to a large PDA and posterolateral vessel as well as a large proximal branch. Left ventriculography revealed normal to low normal LV function with an ejection fraction at 50-55%. There were several PVCs. No definitive wall motion abnormalities were demonstrated. Probable left ventricular hypertrophy. IMPRESSION: Normal to low normal LV function with an ejection fraction at 50-55%. There is evidence for probable left ventricular hypertrophy. Normal coronary arteries. RECOMMENDATION:  Medical therapy with optimal blood pressure control. The patient arrived in the catheterization laboratory on 56 g of iv nitroglycerin. This will be weaned and DC'd as BP allows with increasing medical therapy for blood pressure control. Her mild troponin elevation most likely represents demand ischemia rather than a non-ST segment elevation myocardial infarction.     2D ECHO: 11/07/2014 LV EF: 40% -  45% ------------------------------------------------------------------- Study Conclusions - Left ventricle: The cavity size was normal. Wall thickness was increased in a pattern of severe LVH. Systolic function was mildly  to moderately reduced. The estimated ejection fraction was in the range of 40% to 45%. Regional wall motion abnormalities cannot be excluded. Left ventricular diastolic function parameters were normal. - Aortic valve: There was mild regurgitation. - Mitral valve: There was moderate regurgitation. - Tricuspid valve: There was severe regurgitation. - Pulmonic valve: There was moderate regurgitation. - Pulmonary arteries: PA peak pressure: 65 mm Hg (S).   Discharge Medications     Medication List    TAKE these medications        allopurinol 300 MG tablet  Commonly known as:  ZYLOPRIM  Take 300 mg by mouth  daily.     aspirin 81 MG tablet  Take 81 mg by mouth daily.     carvedilol 6.25 MG tablet  Commonly known as:  COREG  Take 1 tablet (6.25 mg total) by mouth 2 (two) times daily with a meal.     valsartan-hydrochlorothiazide 160-25 MG per tablet  Commonly known as:  DIOVAN-HCT  Take 1 tablet by mouth daily.        Disposition   The patient will be discharged in stable condition to home.  Follow-up Information    Follow up with [facility].   Why:  Rolling walker   Contact information:   [address] [place] [id number] [phone]       Follow up with Dr. Tasia CatchingsAhmed On 11/10/2014.   Why:  @ 10am.         Duration of Discharge Encounter: Greater than 30 minutes including physician and PA time.  Byrd HesselbachSigned, THOMPSON, KATHRYN R PA-C 11/08/2014, 11:34 AM Patient seen and examined. I agree with the assessment and plan as detailed above. See also my additional thoughts below.   I have made the decision for the patient to go home. She is stable at this time. We have made arrangements for her to follow-up with her cardiologist at [place].  Willa RoughJeffrey Katz, MD, [facility] 11/08/2014 12:04 PM

## 2014-11-16 ENCOUNTER — Encounter (HOSPITAL_COMMUNITY): Payer: Self-pay | Admitting: Cardiovascular Disease

## 2015-08-02 ENCOUNTER — Telehealth: Payer: Self-pay | Admitting: Physician Assistant

## 2015-08-02 NOTE — Telephone Encounter (Signed)
Received records from [facility] for appointment on 08/10/15 with Wilburt Finlay, PA.  Records given to [employer] (medical records) for Bryan's schedule on 08/10/15. lp

## 2015-08-03 ENCOUNTER — Emergency Department (HOSPITAL_COMMUNITY): Payer: Medicare Other

## 2015-08-03 ENCOUNTER — Encounter (HOSPITAL_COMMUNITY): Payer: Self-pay | Admitting: *Deleted

## 2015-08-03 ENCOUNTER — Inpatient Hospital Stay (HOSPITAL_COMMUNITY)
Admission: EM | Admit: 2015-08-03 | Discharge: 2015-08-08 | DRG: 291 | Disposition: A | Discharge status: Home Health | Payer: Medicare Other | Attending: Internal Medicine | Admitting: Internal Medicine

## 2015-08-03 DIAGNOSIS — M109 Gout, unspecified: Secondary | ICD-10-CM | POA: Diagnosis present

## 2015-08-03 DIAGNOSIS — I248 Other forms of acute ischemic heart disease: Secondary | ICD-10-CM | POA: Diagnosis present

## 2015-08-03 DIAGNOSIS — Z6833 Body mass index (BMI) 33.0-33.9, adult: Secondary | ICD-10-CM

## 2015-08-03 DIAGNOSIS — I509 Heart failure, unspecified: Secondary | ICD-10-CM

## 2015-08-03 DIAGNOSIS — Z79899 Other long term (current) drug therapy: Secondary | ICD-10-CM | POA: Diagnosis not present

## 2015-08-03 DIAGNOSIS — I5023 Acute on chronic systolic (congestive) heart failure: Secondary | ICD-10-CM | POA: Diagnosis not present

## 2015-08-03 DIAGNOSIS — Z7982 Long term (current) use of aspirin: Secondary | ICD-10-CM | POA: Diagnosis not present

## 2015-08-03 DIAGNOSIS — I130 Hypertensive heart and chronic kidney disease with heart failure and stage 1 through stage 4 chronic kidney disease, or unspecified chronic kidney disease: Secondary | ICD-10-CM | POA: Diagnosis present

## 2015-08-03 DIAGNOSIS — N179 Acute kidney failure, unspecified: Secondary | ICD-10-CM | POA: Diagnosis not present

## 2015-08-03 DIAGNOSIS — I10 Essential (primary) hypertension: Secondary | ICD-10-CM | POA: Diagnosis not present

## 2015-08-03 DIAGNOSIS — Z950 Presence of cardiac pacemaker: Secondary | ICD-10-CM | POA: Diagnosis not present

## 2015-08-03 DIAGNOSIS — E663 Overweight: Secondary | ICD-10-CM | POA: Diagnosis present

## 2015-08-03 DIAGNOSIS — M79606 Pain in leg, unspecified: Secondary | ICD-10-CM | POA: Diagnosis not present

## 2015-08-03 DIAGNOSIS — J189 Pneumonia, unspecified organism: Secondary | ICD-10-CM

## 2015-08-03 DIAGNOSIS — R7989 Other specified abnormal findings of blood chemistry: Secondary | ICD-10-CM

## 2015-08-03 DIAGNOSIS — J9601 Acute respiratory failure with hypoxia: Secondary | ICD-10-CM | POA: Diagnosis present

## 2015-08-03 DIAGNOSIS — N183 Chronic kidney disease, stage 3 unspecified: Secondary | ICD-10-CM | POA: Diagnosis present

## 2015-08-03 DIAGNOSIS — I2510 Atherosclerotic heart disease of native coronary artery without angina pectoris: Secondary | ICD-10-CM | POA: Diagnosis present

## 2015-08-03 DIAGNOSIS — R0602 Shortness of breath: Secondary | ICD-10-CM | POA: Diagnosis not present

## 2015-08-03 DIAGNOSIS — R0603 Acute respiratory distress: Secondary | ICD-10-CM | POA: Diagnosis present

## 2015-08-03 DIAGNOSIS — J181 Lobar pneumonia, unspecified organism: Secondary | ICD-10-CM | POA: Diagnosis present

## 2015-08-03 DIAGNOSIS — M1000 Idiopathic gout, unspecified site: Secondary | ICD-10-CM | POA: Diagnosis present

## 2015-08-03 DIAGNOSIS — I5043 Acute on chronic combined systolic (congestive) and diastolic (congestive) heart failure: Secondary | ICD-10-CM | POA: Diagnosis not present

## 2015-08-03 DIAGNOSIS — J80 Acute respiratory distress syndrome: Secondary | ICD-10-CM | POA: Diagnosis not present

## 2015-08-03 LAB — BASIC METABOLIC PANEL WITH GFR
Anion gap: 10 (ref 5–15)
BUN: 49 mg/dL — ABNORMAL HIGH (ref 6–20)
CO2: 22 mmol/L (ref 22–32)
Calcium: 8.9 mg/dL (ref 8.9–10.3)
Chloride: 103 mmol/L (ref 101–111)
Creatinine, Ser: 2.01 mg/dL — ABNORMAL HIGH (ref 0.44–1.00)
GFR calc Af Amer: 27 mL/min — ABNORMAL LOW (ref >60)
GFR calc non Af Amer: 23 mL/min — ABNORMAL LOW (ref >60)
Glucose, Bld: 93 mg/dL (ref 65–99)
Potassium: 3.9 mmol/L (ref 3.5–5.1)
Sodium: 135 mmol/L (ref 135–145)

## 2015-08-03 LAB — TROPONIN I: Troponin I: 0.13 ng/mL — ABNORMAL HIGH (ref <0.031)

## 2015-08-03 LAB — I-STAT TROPONIN, ED: TROPONIN I, POC: 0.12 ng/mL — AB (ref 0.00–0.08)

## 2015-08-03 LAB — CBC
HCT: 28.2 % — ABNORMAL LOW (ref 36.0–46.0)
Hemoglobin: 9.3 g/dL — ABNORMAL LOW (ref 12.0–15.0)
MCH: 29.9 pg (ref 26.0–34.0)
MCHC: 33 g/dL (ref 30.0–36.0)
MCV: 90.7 fL (ref 78.0–100.0)
Platelets: 150 K/uL (ref 150–400)
RBC: 3.11 MIL/uL — ABNORMAL LOW (ref 3.87–5.11)
RDW: 14.6 % (ref 11.5–15.5)
WBC: 8 K/uL (ref 4.0–10.5)

## 2015-08-03 LAB — BRAIN NATRIURETIC PEPTIDE: B NATRIURETIC PEPTIDE 5: 626.1 pg/mL — AB (ref 0.0–100.0)

## 2015-08-03 LAB — D-DIMER, QUANTITATIVE (NOT AT ARMC): D DIMER QUANT: 3.75 ug{FEU}/mL — AB (ref 0.00–0.48)

## 2015-08-03 MED ORDER — HYDROMORPHONE HCL 1 MG/ML IJ SOLN
1.0000 mg | Freq: Once | INTRAMUSCULAR | Status: AC
  Administered 2015-08-03: 1 mg via INTRAVENOUS
  Filled 2015-08-03: qty 1

## 2015-08-03 MED ORDER — ASPIRIN 81 MG PO CHEW
324.0000 mg | CHEWABLE_TABLET | Freq: Once | ORAL | Status: AC
  Administered 2015-08-03: 324 mg via ORAL
  Filled 2015-08-03: qty 4

## 2015-08-03 MED ORDER — DEXTROSE 5 % IV SOLN
1.0000 g | Freq: Once | INTRAVENOUS | Status: AC
  Administered 2015-08-03: 1 g via INTRAVENOUS
  Filled 2015-08-03: qty 10

## 2015-08-03 MED ORDER — ONDANSETRON HCL 4 MG/2ML IJ SOLN
4.0000 mg | Freq: Once | INTRAMUSCULAR | Status: AC
  Administered 2015-08-03: 4 mg via INTRAVENOUS
  Filled 2015-08-03: qty 2

## 2015-08-03 MED ORDER — DEXTROSE 5 % IV SOLN
500.0000 mg | Freq: Once | INTRAVENOUS | Status: AC
  Administered 2015-08-03: 500 mg via INTRAVENOUS
  Filled 2015-08-03: qty 500

## 2015-08-03 NOTE — H&P (Signed)
[facility] History and Physical  Magdalen Cabana JXB:[id number] DOB: May 10, 1941 DOA: 08/03/2015  Referring physician: ED physician PCP: Angela Adam, MD  Specialists:   Chief Complaint: Shortness of breath  HPI: Anita Wiley is a 74 y.o. female with PMH of poor hearing, hypertension, GERD, systolic heart failure (EF of 13-08%), gout, pacemaker placement, bradycardia, CKD-III, CAD, who is with the shortness of breath.  Patient reports that in the past several days she has been having progressively worsening shortness of breath. She has orthopnea. She has mild cough which is chronic and has not changed. She does not have fever, chills, chest pain. No sputum production. She experienced one episode of nausea and nonbloody nonbilious emesis yesterday evening, no abdominal pain. Currently no nausea, vomiting or diarrhea. Patient does not have symptoms of UTI, unilateral weakness, rashes.  In ED, patient was found to have troponin 0.13, BNP 626.1, WBC 8.0, temperature normal, bradycardia, worsening renal function. Chest x-ray mild patchy opacification over the mid to posterior lung base on the lateral film as cannot exclude atelectasis or infection,stable cardiomegaly and stable moderate prominence of the main pulmonary artery segment.  Where does patient live?   At home   Can patient participate in ADLs? Barely   Review of Systems:   General: no fevers, chills, no changes in body weight, has poor appetite, has fatigue HEENT: no blurry vision, hearing changes or sore throat Pulm: has dyspnea, coughing, no wheezing CV: no chest pain, palpitations Abd: had nausea, vomiting, no abdominal pain, diarrhea, constipation GU: no dysuria, burning on urination, increased urinary frequency, hematuria  Ext: has leg edema Neuro: no unilateral weakness, numbness, or tingling, no vision change or hearing loss Skin: no rash MSK: No muscle spasm, no deformity, no limitation of range of movement in spin Heme:  No easy bruising.  Travel history: No recent long distant travel.  Allergy:  Allergies  Allergen Reactions  . Codeine Itching    Past Medical History  Diagnosis Date  . Hypertension   . Bradycardia     s/p pacemaker in 2010  . Chronic kidney disease   . Gout   . Pacemaker 2010  . Arthritis     OSTEO  . Gout     Past Surgical History  Procedure Laterality Date  . Pacemaker insertion      2010  . Knee surgery    . Abdominal hysterectomy      at age 74  . Insert / replace / remove pacemaker  2010  . Left heart catheterization with coronary angiogram N/A 11/06/2014    Procedure: LEFT HEART CATHETERIZATION WITH CORONARY ANGIOGRAM;  Surgeon: Lennette Bihari, MD;  Location: [facility] CATH LAB;  Service: Cardiovascular;  Laterality: N/A;    Social History:  reports that she has never smoked. She has never used smokeless tobacco. She reports that she does not drink alcohol or use illicit drugs.  Family History:  Family History  Problem Relation Age of Onset  . Hypertension Mother   . CAD Sister     unkown age     Prior to Admission medications   Medication Sig Start Date End Date Taking? Authorizing Provider  allopurinol (ZYLOPRIM) 100 MG tablet Take 100 mg by mouth daily.   Yes Historical Provider, MD  aspirin 81 MG tablet Take 81 mg by mouth daily.   Yes Historical Provider, MD  furosemide (LASIX) 40 MG tablet Take 40 mg by mouth daily.   Yes Historical Provider, MD  metoprolol succinate (TOPROL-XL) 50 MG 24  hr tablet Take 50 mg by mouth daily. Take with or immediately following a meal.   Yes Historical Provider, MD  potassium chloride (K-DUR) 10 MEQ tablet Take 10 mEq by mouth daily.   Yes Historical Provider, MD  verapamil (COVERA HS) 180 MG (CO) 24 hr tablet Take 180 mg by mouth at bedtime.   Yes Historical Provider, MD  carvedilol (COREG) 6.25 MG tablet Take 1 tablet (6.25 mg total) by mouth 2 (two) times daily with a meal. 11/08/14   Janetta Hora, PA-C    Physical  Exam: Filed Vitals:   08/03/15 2245 08/03/15 2315 08/04/15 0018 08/04/15 0521  BP: 139/63 143/79 123/57 96/71  Pulse: 60 59 62   Temp:   97.2 F (36.2 C) 97.2 F (36.2 C)  TempSrc:   Oral Axillary  Resp: Height:    (1.651 m)   Weight:   88.9 kg (195 lb 15.8 oz)   SpO2: 99% 99% 98% 98%   General: Not in acute distress HEENT:       Eyes: PERRL, EOMI, no scleral icterus.       ENT: No discharge from the ears and nose, no pharynx injection, no tonsillar enlargement.        Neck: positive JVD, no bruit, no mass felt. Heme: No neck lymph node enlargement. Cardiac: S1/S2, RRR, No murmurs, No gallops or rubs. Pulm:  No rales, wheezing, rhonchi or rubs. Abd: Soft, nondistended, nontender, no rebound pain, no organomegaly, BS present. Ext: 2+ pitting leg edema bilaterally. 2+DP/PT pulse bilaterally. Musculoskeletal: No joint deformities, No joint redness or warmth, no limitation of ROM in spin. Skin: No rashes.  Neuro: Alert, oriented X3, cranial nerves II-XII grossly intact, muscle strength 5/5 in all extremities, sensation to light touch intact.  Psych: Patient is not psychotic, no suicidal or hemocidal ideation.  Labs on Admission:  Basic Metabolic Panel:  Recent Labs Lab 08/03/15 1736  NA 135  K 3.9  CL 103  CO2 22  GLUCOSE 93  BUN 49*  CREATININE 2.01*  CALCIUM 8.9   Liver Function Tests: No results for input(s): AST, ALT, ALKPHOS, BILITOT, PROT, ALBUMIN in the last 168 hours. No results for input(s): LIPASE, AMYLASE in the last 168 hours. No results for input(s): AMMONIA in the last 168 hours. CBC:  Recent Labs Lab 08/03/15 1736  WBC 8.0  HGB 9.3*  HCT 28.2*  MCV 90.7  PLT 150   Cardiac Enzymes:  Recent Labs Lab 08/03/15 2101 08/04/15 0133  TROPONINI 0.13* 0.11*    BNP (last 3 results)  Recent Labs  08/03/15 1845  BNP 626.1*    ProBNP (last 3 results)  Recent Labs  11/06/14 1255  PROBNP 2232.0*    CBG: No results for  input(s): GLUCAP in the last 168 hours.  Radiological Exams on Admission: Dg Chest 2 View  08/03/2015   CLINICAL DATA:  Shortness of breath.  EXAM: CHEST  2 VIEW  COMPARISON:  11/07/2014  FINDINGS: Left-sided pacemaker is unchanged. Lungs are hypoinflated without evidence of effusion or pneumothorax. There is mild opacification over the mid to posterior lung base on the lateral film as cannot exclude atelectasis or infection. There is moderate stable cardiomegaly. Moderate prominence of the main pulmonary artery segment. Minimal loss of mid anterior vertebral body height of a vertebral body near the thoraco lumbar junction.  IMPRESSION: Mild patchy opacification over the mid to posterior lung base on the lateral film as cannot exclude atelectasis or infection.  Stable cardiomegaly and stable moderate prominence of the main pulmonary artery segment.  Mild loss of mid anterior vertebral body height of a vertebral body near the thoracolumbar junction.   Electronically Signed   By: Elberta Fortis M.D.   On: 08/03/2015 19:25    EKG: Independently reviewed.  Abnormal findings: QTC 495, pacer rhythm.   Assessment/Plan Principal Problem:   Acute on chronic systolic congestive heart failure Active Problems:   S/P placement of cardiac pacemaker   HTN (hypertension), malignant   Acute renal failure superimposed on stage 3 chronic kidney disease   Coronary artery disease, non-occlusive by cath   Gout   Acute respiratory distress   CHF exacerbation  Acute respiratory distress and acute on chronic systolic congestive heart failure: Patient's shortness of breath, positive JVD, leg edema, orthopnea, are consistent with congestive heart failure exacerbation. Although chest x-ray showed some patchy opacity, patient does not have fever, chills, leukocytosis and chest pain, less likely to have pneumonia. ED started azithromycin and Rocephin for possible pneumonia, I will discontinue Abx and repeat CXR in AM after  good diuresis.  -will admit to tele bed -will admit to tele bed -Lasix 40 mg bid by IV -trop x 3 -2d echo -Risk factor stratification: A1c, FLP -will continue home aspirin, Coreg, metoprolol (unclear why patient is on 2 beta blockers). -Daily weights -strict I/O's -Low salt diet -No lisinopril due to worsening renal function. -UDS -repeat CXR in AM  HTN (hypertension): -On lasix -Coreg and metoprolol -Patient is also on verapamil  AoCKD-III: Baseline Cre is 1.0-1.2, her Cre is 2.01 on admission. Likely due to cardiorenal syndrome. - Check  FeUrea - US-renal - Follow up renal function by BMP  Elevated troponin and CAD: Patient does not have chest pain. Troponin 0.13, most likely due to demanding ischemia secondary to congestive heart failure exacerbation. -follow up 2d echo -trop x 3 -ASA -start lipitor  Gout: stable. -Continue allopurinol  DVT ppx: SQ Heparin  Code Status: Full code Family Communication: None at bed side.   Disposition Plan: Admit to inpatient   Date of Service 08/04/2015    Lorretta Harp [facility] Pager [phone]  If 7PM-7AM, please contact night-coverage [email or web address] Password [email or web address] 08/04/2015, 6:18 AM

## 2015-08-03 NOTE — ED Notes (Signed)
Called main lab again, spoke to paula, in regards to bnp, d-dimer, and now troponin results.

## 2015-08-03 NOTE — ED Notes (Signed)
pts very hard of hearing, reports sob with exertion. ekg done at triage, no resp distress noted at this time, spo2 100%.

## 2015-08-03 NOTE — ED Provider Notes (Signed)
CSN: [id number]     Arrival date & time 08/03/15  1723 History   First MD Initiated Contact with Patient 08/03/15 1811     Chief Complaint  Patient presents with  . Shortness of Breath   Patient is a 74 y.o. female presenting with shortness of breath. The history is provided by the patient. No language interpreter was used.  Shortness of Breath Severity:  Moderate Onset quality:  Gradual Timing:  Constant Progression:  Partially resolved Chronicity:  New Context: activity   Context: not animal exposure, not emotional upset, not known allergens, not occupational exposure, not pollens, not strong odors, not URI and not weather changes   Relieved by:  Rest, sitting up and position changes Worsened by:  Activity (lying down) Ineffective treatments:  Diuretics Associated symptoms: cough (chronic, unchanged), fever (subjective), PND (chronic, unchanged (x2 pillow orthopnea)) and vomiting (x2 episodes NBNB)   Associated symptoms: no abdominal pain, no chest pain, no diaphoresis, no hemoptysis, no sputum production and no wheezing   Risk factors: obesity   Risk factors: no family hx of DVT, no hx of PE/DVT, no prolonged immobilization and no recent surgery     Past Medical History  Diagnosis Date  . Hypertension   . Bradycardia     s/p pacemaker in 2010  . Chronic kidney disease   . Gout   . Pacemaker 2010  . Arthritis     OSTEO  . Gout    Past Surgical History  Procedure Laterality Date  . Pacemaker insertion      2010  . Knee surgery    . Abdominal hysterectomy      at age 2  . Insert / replace / remove pacemaker  2010  . Left heart catheterization with coronary angiogram N/A 11/06/2014    Procedure: LEFT HEART CATHETERIZATION WITH CORONARY ANGIOGRAM;  Surgeon: Lennette Bihari, MD;  Location: [facility] CATH LAB;  Service: Cardiovascular;  Laterality: N/A;   Family History  Problem Relation Age of Onset  . Hypertension Mother   . CAD Sister     unkown age   Social History   Substance Use Topics  . Smoking status: Never Smoker   . Smokeless tobacco: Never Used  . Alcohol Use: No   OB History    No data available     Review of Systems  Constitutional: Positive for fever (subjective). Negative for diaphoresis.  Respiratory: Positive for cough (chronic, unchanged) and shortness of breath. Negative for hemoptysis, sputum production and wheezing.   Cardiovascular: Positive for leg swelling (chronic, slightly improved from previous, L>R) and PND (chronic, unchanged (x2 pillow orthopnea)). Negative for chest pain and palpitations.  Gastrointestinal: Positive for nausea and vomiting (x2 episodes NBNB). Negative for abdominal pain.  All other systems reviewed and are negative.   Allergies  Codeine  Home Medications   Prior to Admission medications   Medication Sig Start Date End Date Taking? Authorizing Provider  allopurinol (ZYLOPRIM) 100 MG tablet Take 100 mg by mouth daily.   Yes Historical Provider, MD  aspirin 81 MG tablet Take 81 mg by mouth daily.   Yes Historical Provider, MD  furosemide (LASIX) 40 MG tablet Take 40 mg by mouth daily.   Yes Historical Provider, MD  metoprolol succinate (TOPROL-XL) 50 MG 24 hr tablet Take 50 mg by mouth daily. Take with or immediately following a meal.   Yes Historical Provider, MD  potassium chloride (K-DUR) 10 MEQ tablet Take 10 mEq by mouth daily.   Yes Historical Provider,  MD  verapamil (COVERA HS) 180 MG (CO) 24 hr tablet Take 180 mg by mouth at bedtime.   Yes Historical Provider, MD  carvedilol (COREG) 6.25 MG tablet Take 1 tablet (6.25 mg total) by mouth 2 (two) times daily with a meal. 11/08/14   Janetta Hora, PA-C   BP 143/79 mmHg  Pulse 59  Temp(Src) 98.2 F (36.8 C) (Oral)  Resp 16  SpO2 99%   Physical Exam  Constitutional: She is oriented to person, place, and time.  Overweight elderly female  HENT:  Head: Normocephalic and atraumatic.  Eyes: Conjunctivae are normal. Pupils are equal, round,  and reactive to light.  Neck: Neck supple.  Cardiovascular: Normal rate, regular rhythm and intact distal pulses.   Pulmonary/Chest: Effort normal. No respiratory distress. She has no wheezes. She has rales.  Breathing well on RA with sat's 97-98%  Abdominal: Soft. Bowel sounds are normal. She exhibits no distension. There is no tenderness. There is no rebound.  Musculoskeletal: Normal range of motion.  Neurological: She is alert and oriented to person, place, and time.  Skin: Skin is warm and dry.  Nursing note and vitals reviewed.   ED Course  Procedures   Labs Review Labs Reviewed  BASIC METABOLIC PANEL - Abnormal; Notable for the following:    BUN 49 (*)    Creatinine, Ser 2.01 (*)    GFR calc non Af Amer 23 (*)    GFR calc Af Amer 27 (*)    All other components within normal limits  CBC - Abnormal; Notable for the following:    RBC 3.11 (*)    Hemoglobin 9.3 (*)    HCT 28.2 (*)    All other components within normal limits  D-DIMER, QUANTITATIVE (NOT AT [facility]) - Abnormal; Notable for the following:    D-Dimer, Quant 3.75 (*)    All other components within normal limits  BRAIN NATRIURETIC PEPTIDE - Abnormal; Notable for the following:    B Natriuretic Peptide 626.1 (*)    All other components within normal limits  TROPONIN I - Abnormal; Notable for the following:    Troponin I 0.13 (*)    All other components within normal limits  I-STAT TROPOININ, ED - Abnormal; Notable for the following:    Troponin i, poc 0.12 (*)    All other components within normal limits    Imaging Review Dg Chest 2 View  08/03/2015   CLINICAL DATA:  Shortness of breath.  EXAM: CHEST  2 VIEW  COMPARISON:  11/07/2014  FINDINGS: Left-sided pacemaker is unchanged. Lungs are hypoinflated without evidence of effusion or pneumothorax. There is mild opacification over the mid to posterior lung base on the lateral film as cannot exclude atelectasis or infection. There is moderate stable cardiomegaly.  Moderate prominence of the main pulmonary artery segment. Minimal loss of mid anterior vertebral body height of a vertebral body near the thoraco lumbar junction.  IMPRESSION: Mild patchy opacification over the mid to posterior lung base on the lateral film as cannot exclude atelectasis or infection.  Stable cardiomegaly and stable moderate prominence of the main pulmonary artery segment.  Mild loss of mid anterior vertebral body height of a vertebral body near the thoracolumbar junction.   Electronically Signed   By: Elberta Fortis M.D.   On: 08/03/2015 19:25   I have personally reviewed and evaluated these images and lab results as part of my medical decision-making.   EKG Interpretation   Date/Time:  Friday August 03 2015 17:32:53  EDT Ventricular Rate:  61 PR Interval:  172 QRS Duration: 144 QT Interval:  444 QTC Calculation: 446 R Axis:   -65 Text Interpretation:  AV dual-paced rhythm Abnormal ECG ATRIAL PACED  RHYTHM No significant change since last tracing Left bundle branch block  Reconfirmed by Kandis Mannan ([id number]) on 08/03/2015 7:41:23 PM      MDM  Ms. Ranieri is a 74 year old female with past medical history of hypertension coronary artery disease with MI and subsequent bradycardia status post pacemaker placement in 2010 as well as chronic kidney disease presenting with worsening shortness of breath. Associate symptoms include a cough which is chronic per patient and unchanged from baseline. Patient also has orthopnea with use of 2 pillows at night which he also states is unchanged from baseline. She also experienced one episode of nausea and nonbloody nonbilious emesis yesterday evening but not associated with pain. Patient notes that with previous heart attack she experienced left-sided chest pain as well as diaphoresis and nausea which she denies currently.  Exam above notable for overweight elderly female lying in bed in no acute distress.  Mildly tachypneic to low 20's.  Maintaining oxygen saturation on room air without submental option. Patient does have coarse breath sounds worse on the left. Abdominal exam benign.  EKG notable for no acute changes. First troponin is 0.12 (reviewed previous troponins from prior MI and found to be at 4.5 - given this in conjunction with her CHF and chronic kidney disease believe this to be a troponin leak given absence of chest pain currently (her previous MI presented with left-sided chest pain and diaphoresis of which patient has neither). BMP and 600s. Chest x-ray notable for patchy opacity in the mid to posterior lung base on the lateral films concerning for infection. Blood cultures drawn and Azithromycin and Rocephin started for concern for CAP. Patient has work showed a swelling which improved with her Lasix but appears to be more prominent on her left side so d-dimer obtained which was elevated - this was prior to CXR results. Patient has a care with creatinine of 2.0 and is unable to undergo contrasted CTA chest. Given a source of pneumonia to suggest a reason for the shortness of breath, will continue to monitor liver extremity edema inpatient.  Patient admitted to the hospital service for further evaluation and management of her CAP setting of a mildly chaotic. Patient understands and agrees with the plan and has no further questions at this time.  Patient care discussed with and followed by my attending Dr. Corlis Leak   Final diagnoses:  SOB (shortness of breath)  CAP (community acquired pneumonia)  AKI (acute kidney injury)    Angelina Ok, MD 08/04/15 0008  Courteney Lyn Mackuen, MD 08/04/15 6045

## 2015-08-03 NOTE — ED Notes (Signed)
Patient allowed to eat at this time, provided meal. Also called main lab in regards to bnp, d-dimer result.

## 2015-08-03 NOTE — ED Notes (Signed)
CT angio cannot be performed due to creatinine level. Informed Dr. Joaquim Lai. Awaiting admitting MD.

## 2015-08-03 NOTE — ED Provider Notes (Signed)
I saw and evaluated the patient, reviewed the resident's note and I agree with the findings and plan.   EKG Interpretation   Date/Time:  Friday August 03 2015 17:32:53 EDT Ventricular Rate:  61 PR Interval:  172 QRS Duration: 144 QT Interval:  444 QTC Calculation: 446 R Axis:   -65 Text Interpretation:  AV dual-paced rhythm Abnormal ECG ATRIAL PACED  RHYTHM No significant change since last tracing Confirmed by Kandis Mannan ([id number]) on 08/03/2015 7:27:34 PM      Patient is a 71 old female presenting with shortness of breath. Patient differential includes pneumonia. CAD fluid overload or unable to him. Patient has elevated troponin.  Concerning for pneumonia. We'll treat for community acquired pneumonia. Awaiting d-dimer. EKG is nonischemic, pacer.  At this time no chest pain.   Jalacia Mattila Randall An, MD 08/03/15 [phone]

## 2015-08-04 ENCOUNTER — Inpatient Hospital Stay (HOSPITAL_COMMUNITY): Payer: Medicare Other

## 2015-08-04 LAB — TROPONIN I
TROPONIN I: 0.09 ng/mL — AB (ref <0.031)
TROPONIN I: 0.08 ng/mL — AB (ref <0.031)
Troponin I: 0.11 ng/mL — ABNORMAL HIGH (ref <0.031)

## 2015-08-04 LAB — PROTIME-INR
INR: 1.38 (ref 0.00–1.49)
Prothrombin Time: 17.1 seconds — ABNORMAL HIGH (ref 11.6–15.2)

## 2015-08-04 LAB — BASIC METABOLIC PANEL
Anion gap: 9 (ref 5–15)
BUN: 52 mg/dL — AB (ref 6–20)
CHLORIDE: 99 mmol/L — AB (ref 101–111)
CO2: 26 mmol/L (ref 22–32)
CREATININE: 2.07 mg/dL — AB (ref 0.44–1.00)
Calcium: 9.2 mg/dL (ref 8.9–10.3)
GFR calc Af Amer: 26 mL/min — ABNORMAL LOW (ref >60)
GFR calc non Af Amer: 22 mL/min — ABNORMAL LOW (ref >60)
GLUCOSE: 125 mg/dL — AB (ref 65–99)
POTASSIUM: 4.6 mmol/L (ref 3.5–5.1)
Sodium: 134 mmol/L — ABNORMAL LOW (ref 135–145)

## 2015-08-04 LAB — PROCALCITONIN: Procalcitonin: 0.32 ng/mL

## 2015-08-04 LAB — RAPID URINE DRUG SCREEN, HOSP PERFORMED
AMPHETAMINES: NOT DETECTED
Barbiturates: NOT DETECTED
Benzodiazepines: NOT DETECTED
Cocaine: NOT DETECTED
OPIATES: NOT DETECTED
Tetrahydrocannabinol: NOT DETECTED

## 2015-08-04 LAB — LIPID PANEL
CHOL/HDL RATIO: 3.6 ratio
Cholesterol: 126 mg/dL (ref 0–200)
HDL: 35 mg/dL — AB (ref >40)
LDL Cholesterol: 79 mg/dL (ref 0–99)
TRIGLYCERIDES: 62 mg/dL (ref <150)
VLDL: 12 mg/dL (ref 0–40)

## 2015-08-04 LAB — MAGNESIUM: Magnesium: 2.1 mg/dL (ref 1.7–2.4)

## 2015-08-04 LAB — APTT: APTT: 30 s (ref 24–37)

## 2015-08-04 LAB — CREATININE, URINE, RANDOM: Creatinine, Urine: 96.16 mg/dL

## 2015-08-04 MED ORDER — TECHNETIUM TO 99M ALBUMIN AGGREGATED
3.0000 | Freq: Once | INTRAVENOUS | Status: AC | PRN
  Administered 2015-08-04: 3.28 via INTRAVENOUS

## 2015-08-04 MED ORDER — ASPIRIN EC 81 MG PO TBEC
81.0000 mg | DELAYED_RELEASE_TABLET | Freq: Every day | ORAL | Status: DC
  Administered 2015-08-04 – 2015-08-08 (×5): 81 mg via ORAL
  Filled 2015-08-04 (×5): qty 1

## 2015-08-04 MED ORDER — ALLOPURINOL 100 MG PO TABS
100.0000 mg | ORAL_TABLET | Freq: Every day | ORAL | Status: DC
  Administered 2015-08-04 – 2015-08-08 (×5): 100 mg via ORAL
  Filled 2015-08-04 (×6): qty 1

## 2015-08-04 MED ORDER — CEFTRIAXONE SODIUM 1 G IJ SOLR
1.0000 g | INTRAMUSCULAR | Status: DC
  Filled 2015-08-04: qty 10

## 2015-08-04 MED ORDER — NITROGLYCERIN 0.4 MG SL SUBL: 0.4000 mg | SUBLINGUAL_TABLET | SUBLINGUAL | Status: DC | PRN

## 2015-08-04 MED ORDER — METOPROLOL SUCCINATE ER 50 MG PO TB24: 50.0000 mg | ORAL_TABLET | Freq: Every day | ORAL | Status: DC

## 2015-08-04 MED ORDER — SODIUM CHLORIDE 0.9 % IJ SOLN
3.0000 mL | Freq: Two times a day (BID) | INTRAMUSCULAR | Status: DC
  Administered 2015-08-04 – 2015-08-07 (×8): 3 mL via INTRAVENOUS

## 2015-08-04 MED ORDER — VERAPAMIL HCL ER 180 MG PO TBCR
180.0000 mg | EXTENDED_RELEASE_TABLET | Freq: Every day | ORAL | Status: DC
  Administered 2015-08-04 – 2015-08-07 (×5): 180 mg via ORAL
  Filled 2015-08-04 (×9): qty 1

## 2015-08-04 MED ORDER — ATORVASTATIN CALCIUM 40 MG PO TABS
40.0000 mg | ORAL_TABLET | Freq: Every day | ORAL | Status: DC
Start: 2015-08-04 — End: 2015-08-08
  Administered 2015-08-04 – 2015-08-07 (×5): 40 mg via ORAL
  Filled 2015-08-04 (×5): qty 1

## 2015-08-04 MED ORDER — SODIUM CHLORIDE 0.9 % IV SOLN
250.0000 mL | INTRAVENOUS | Status: DC | PRN
  Administered 2015-08-04: 250 mL via INTRAVENOUS

## 2015-08-04 MED ORDER — DEXTROSE 5 % IV SOLN
500.0000 mg | INTRAVENOUS | Status: DC
  Administered 2015-08-04 – 2015-08-05 (×2): 500 mg via INTRAVENOUS
  Filled 2015-08-04 (×3): qty 500

## 2015-08-04 MED ORDER — SODIUM CHLORIDE 0.9 % IJ SOLN
3.0000 mL | INTRAMUSCULAR | Status: DC | PRN
  Administered 2015-08-08: 3 mL via INTRAVENOUS
  Filled 2015-08-04: qty 3

## 2015-08-04 MED ORDER — MORPHINE SULFATE (PF) 2 MG/ML IV SOLN
2.0000 mg | INTRAVENOUS | Status: DC | PRN
  Administered 2015-08-07: 2 mg via INTRAVENOUS
  Filled 2015-08-04: qty 1

## 2015-08-04 MED ORDER — CARVEDILOL 6.25 MG PO TABS
6.2500 mg | ORAL_TABLET | Freq: Two times a day (BID) | ORAL | Status: DC
Start: 2015-08-04 — End: 2015-08-04

## 2015-08-04 MED ORDER — FUROSEMIDE 10 MG/ML IJ SOLN
40.0000 mg | Freq: Two times a day (BID) | INTRAMUSCULAR | Status: DC
  Administered 2015-08-04 (×3): 40 mg via INTRAVENOUS
  Filled 2015-08-04 (×4): qty 4

## 2015-08-04 MED ORDER — HEPARIN SODIUM (PORCINE) 5000 UNIT/ML IJ SOLN
5000.0000 [IU] | Freq: Three times a day (TID) | INTRAMUSCULAR | Status: DC
  Administered 2015-08-04 – 2015-08-08 (×13): 5000 [IU] via SUBCUTANEOUS
  Filled 2015-08-04 (×14): qty 1

## 2015-08-04 MED ORDER — DEXTROSE 5 % IV SOLN
1.0000 g | INTRAVENOUS | Status: DC
  Administered 2015-08-05 – 2015-08-07 (×3): 1 g via INTRAVENOUS
  Filled 2015-08-04 (×5): qty 10

## 2015-08-04 MED ORDER — ASPIRIN 81 MG PO TABS: 81.0000 mg | ORAL_TABLET | Freq: Every day | ORAL | Status: DC

## 2015-08-04 MED ORDER — DEXTROSE 5 % IV SOLN
1.0000 g | INTRAVENOUS | Status: DC
  Administered 2015-08-04: 1 g via INTRAVENOUS
  Filled 2015-08-04: qty 10

## 2015-08-04 MED ORDER — ACETAMINOPHEN 325 MG PO TABS: 650.0000 mg | ORAL_TABLET | ORAL | Status: DC | PRN

## 2015-08-04 NOTE — Progress Notes (Signed)
PROGRESS NOTE  Anita Wiley ZOX:[id number] DOB: 12/25/1940 DOA: 08/03/2015 PCP: Angela Adam, MD  Assessment/Plan: Acute respiratory distress and acute on chronic systolic congestive heart failure: -was due to pick up O2 arranged by PCP -procalcitonin -Lasix 40 mg bid by IV -trop x 3 -2d echo- in 2015 showed EF of 45% -will continue home aspirin, Coreg-- d/c metoprolol -Daily weights -strict I/O's -Low salt diet -No lisinopril due to worsening renal function. -UDS negative -repeat CXR   Elevated d dimer -v/Q scan  HTN (hypertension): -On lasix -Coreg and metoprolol -Patient is also on verapamil  AoCKD-III: Baseline Cre is 1.0-1.2, her Cre is 2.01 on admission. Likely due to cardiorenal syndrome. - Check FeUrea - US-renal - Follow up renal function by BMP  Elevated troponin and CAD: Patient does not have chest pain. Troponin 0.13, most likely due to demanding ischemia secondary to congestive heart failure exacerbation. -follow up 2d echo -trop x 3 -ASA -start lipitor  Gout: stable. -Continue allopurinol  Code Status: full Family Communication:  Disposition Plan:    Consultants:    Procedures:      HPI/Subjective: Breathing not improved  Objective: Filed Vitals:   08/04/15 0521  BP: 96/71  Pulse:   Temp: 97.2 F (36.2 C)  Resp: 18    Intake/Output Summary (Last 24 hours) at 08/04/15 0825 Last data filed at 08/04/15 0600  Gross per 24 hour  Intake     60 ml  Output      0 ml  Net     60 ml   Filed Weights   08/04/15 0018  Weight: 88.9 kg (195 lb 15.8 oz)    Exam:   General:  A+Ox3. NAD  Cardiovascular: rrr  Respiratory: diminished  Abdomen: +BS ,soft  Musculoskeletal: min edema   Data Reviewed: Basic Metabolic Panel:  Recent Labs Lab 08/03/15 1736 08/04/15 0615  NA 135 134*  K 3.9 4.6  CL 103 99*  CO2 22 26  GLUCOSE 93 125*  BUN 49* 52*  CREATININE 2.01* 2.07*  CALCIUM 8.9 9.2  MG  --  2.1   Liver Function  Tests: No results for input(s): AST, ALT, ALKPHOS, BILITOT, PROT, ALBUMIN in the last 168 hours. No results for input(s): LIPASE, AMYLASE in the last 168 hours. No results for input(s): AMMONIA in the last 168 hours. CBC:  Recent Labs Lab 08/03/15 1736  WBC 8.0  HGB 9.3*  HCT 28.2*  MCV 90.7  PLT 150   Cardiac Enzymes:  Recent Labs Lab 08/03/15 2101 08/04/15 0133 08/04/15 0615  TROPONINI 0.13* 0.11* 0.09*   BNP (last 3 results)  Recent Labs  08/03/15 1845  BNP 626.1*    ProBNP (last 3 results)  Recent Labs  11/06/14 1255  PROBNP 2232.0*    CBG: No results for input(s): GLUCAP in the last 168 hours.  No results found for this or any previous visit (from the past 240 hour(s)).   Studies: Dg Chest 2 View  08/03/2015   CLINICAL DATA:  Shortness of breath.  EXAM: CHEST  2 VIEW  COMPARISON:  11/07/2014  FINDINGS: Left-sided pacemaker is unchanged. Lungs are hypoinflated without evidence of effusion or pneumothorax. There is mild opacification over the mid to posterior lung base on the lateral film as cannot exclude atelectasis or infection. There is moderate stable cardiomegaly. Moderate prominence of the main pulmonary artery segment. Minimal loss of mid anterior vertebral body height of a vertebral body near the thoraco lumbar junction.  IMPRESSION: Mild patchy opacification over the  mid to posterior lung base on the lateral film as cannot exclude atelectasis or infection.  Stable cardiomegaly and stable moderate prominence of the main pulmonary artery segment.  Mild loss of mid anterior vertebral body height of a vertebral body near the thoracolumbar junction.   Electronically Signed   By: Elberta Fortis M.D.   On: 08/03/2015 19:25    Scheduled Meds: . allopurinol  100 mg Oral Daily  . aspirin EC  81 mg Oral Daily  . atorvastatin  40 mg Oral q1800  . furosemide  40 mg Intravenous BID  . heparin  5,000 Units Subcutaneous 3 times per day  . metoprolol succinate  50  mg Oral Daily  . sodium chloride  3 mL Intravenous Q12H  . verapamil  180 mg Oral QHS   Continuous Infusions:  Antibiotics Given (last 72 hours)    None      Principal Problem:   Acute on chronic systolic congestive heart failure Active Problems:   S/P placement of cardiac pacemaker   HTN (hypertension), malignant   Acute renal failure superimposed on stage 3 chronic kidney disease   Coronary artery disease, non-occlusive by cath   Gout   Acute respiratory distress   CHF exacerbation    Time spent: 35 min    Anita Wiley  [facility] Pager [phone]. If 7PM-7AM, please contact night-coverage at [email or web address], password [facility] 08/04/2015, 8:25 AM  LOS: 1 day

## 2015-08-04 NOTE — Evaluation (Signed)
Physical Therapy Evaluation Patient Details Name: Anita Wiley [name]: [id number] DOB: 18-Mar-1941 Today's Date: 08/04/2015   History of Present Illness  Patient is a 74 yo female admitted 08/03/15 with SOB.  Scan - for PE per chart.  PMH:  HOH, HTN, CHF, pacemaker, bradycardia, CKD, CAD  Clinical Impression  Patient presents with problems listed below.  Will benefit from acute PT to maximize functional independence prior to discharge.  Recommend RW for discharge for balance/safety.    Follow Up Recommendations No PT follow up;Supervision - Intermittent (Patient declined HHPT)    Equipment Recommendations  Rolling walker with 5" wheels    Recommendations for Other Services       Precautions / Restrictions Precautions Precautions: Fall Restrictions Weight Bearing Restrictions: No      Mobility  Bed Mobility Overal bed mobility: Modified Independent             General bed mobility comments: Increased time  Transfers Overall transfer level: Modified independent Equipment used: Rolling walker (2 wheeled)                Ambulation/Gait Ambulation/Gait assistance: Supervision Ambulation Distance (Feet): 24 Feet Assistive device: Rolling walker (2 wheeled) Gait Pattern/deviations: Step-through pattern;Decreased stride length;Shuffle Gait velocity: Decreased Gait velocity interpretation: Below normal speed for age/gender General Gait Details: Verbal cues for safe use of RW.  Patient with good balance during gait with RW.  Noted dyspnea 3/4 during gait on O2.  Stairs            Wheelchair Mobility    Modified Rankin (Stroke Patients Only)       Balance                                             Pertinent Vitals/Pain Pain Assessment: No/denies pain    Home Living Family/patient expects to be discharged to:: Private residence Living Arrangements: Alone Available Help at Discharge: Family;Friend(s);Neighbor;Available  PRN/intermittently Type of Home: Apartment Home Access: Level entry     Home Layout: One level Home Equipment: Cane - single point      Prior Function Level of Independence: Independent with assistive device(s);Needs assistance   Gait / Transfers Assistance Needed: Uses cane for ambulation.  ADL's / Homemaking Assistance Needed: Friends/neighbors assist with housekeeping, laundry  Comments: Friends and family provide rides for errands.     Hand Dominance        Extremity/Trunk Assessment   Upper Extremity Assessment: Overall WFL for tasks assessed           Lower Extremity Assessment: Generalized weakness         Communication   Communication: No difficulties  Cognition Arousal/Alertness: Awake/alert Behavior During Therapy: WFL for tasks assessed/performed Overall Cognitive Status: Within Functional Limits for tasks assessed                      General Comments      Exercises        Assessment/Plan    PT Assessment Patient needs continued PT services  PT Diagnosis Difficulty walking;Generalized weakness   PT Problem List Decreased strength;Decreased activity tolerance;Decreased balance;Decreased mobility;Decreased knowledge of use of DME;Cardiopulmonary status limiting activity  PT Treatment Interventions DME instruction;Gait training;Functional mobility training;Therapeutic activities;Therapeutic exercise;Patient/family education   PT Goals (Current goals can be found in the Care Plan section) Acute Rehab PT Goals Patient Stated Goal: To go  home soon.  To breathe better. PT Goal Formulation: With patient Time For Goal Achievement: 08/11/15 Potential to Achieve Goals: Good    Frequency Min 3X/week   Barriers to discharge Decreased caregiver support Patient lives alone.    Co-evaluation               End of Session Equipment Utilized During Treatment: Gait belt;Oxygen Activity Tolerance: Patient limited by fatigue (Limited by  DOE) Patient left: in bed;with call bell/phone within reach Nurse Communication: Mobility status         Time: 1610-9604 PT Time Calculation (min) (ACUTE ONLY): 24 min   Charges:   PT Evaluation $Initial PT Evaluation Tier I: 1 Procedure PT Treatments $Gait Training: 8-22 mins   PT G Codes:        Vena Austria 20-Aug-2015, 8:09 PM Durenda Hurt. Renaldo Fiddler, [facility] Acute Rehab Services Pager [phone]

## 2015-08-04 NOTE — Progress Notes (Signed)
Admitted pt from ED AAOX3 . assisted to bed , oriented to room and call bell. VS taken and recorded. TELE on.maintained NPO as ordered. 02 2lnc. No SOB noted.

## 2015-08-05 ENCOUNTER — Inpatient Hospital Stay (HOSPITAL_COMMUNITY): Payer: Medicare Other

## 2015-08-05 DIAGNOSIS — I509 Heart failure, unspecified: Secondary | ICD-10-CM

## 2015-08-05 LAB — BASIC METABOLIC PANEL
ANION GAP: 9 (ref 5–15)
BUN: 57 mg/dL — ABNORMAL HIGH (ref 6–20)
CALCIUM: 8.8 mg/dL — AB (ref 8.9–10.3)
CO2: 26 mmol/L (ref 22–32)
CREATININE: 2.42 mg/dL — AB (ref 0.44–1.00)
Chloride: 99 mmol/L — ABNORMAL LOW (ref 101–111)
GFR, EST AFRICAN AMERICAN: 21 mL/min — AB (ref >60)
GFR, EST NON AFRICAN AMERICAN: 18 mL/min — AB (ref >60)
Glucose, Bld: 100 mg/dL — ABNORMAL HIGH (ref 65–99)
Potassium: 4.1 mmol/L (ref 3.5–5.1)
SODIUM: 134 mmol/L — AB (ref 135–145)

## 2015-08-05 LAB — UREA NITROGEN, URINE: Urea Nitrogen, Ur: 463 mg/dL

## 2015-08-05 MED ORDER — FUROSEMIDE 40 MG PO TABS
40.0000 mg | ORAL_TABLET | Freq: Every day | ORAL | Status: DC
  Administered 2015-08-06 – 2015-08-08 (×3): 40 mg via ORAL
  Filled 2015-08-05 (×2): qty 1
  Filled 2015-08-05: qty 2

## 2015-08-05 NOTE — Progress Notes (Signed)
  Echocardiogram 2D Echocardiogram has been performed.  Delcie Roch 08/05/2015, 4:44 PM

## 2015-08-05 NOTE — Progress Notes (Addendum)
PROGRESS NOTE  Anita Wiley ZOX:[id number] DOB: [birth year] DOA: 08/03/2015 PCP: Angela Adam, MD  Assessment/Plan: Acute respiratory distress due to acute on chronic systolic congestive heart failure + PNA -was due to pick up O2 arranged by PCP- will do home O2 study -procalcitonin elevated- IV abx -Lasix 40 mg bid by IV- change to PO daily as Cr inreasing -trop x 3- flat -2d echo- in 2015 showed EF of 45% -will continue home aspirin, Coreg-- d/c metoprolol -Daily weights -strict I/O's -Low salt diet -No lisinopril due to worsening renal function. -UDS negative -repeat CXR   Elevated d dimer -v/Q scan negative  HTN (hypertension): -On lasix -Coreg and metoprolol -Patient is also on verapamil  AoCKD-III: Baseline Cre is 1.0-1.2, her Cre is 2.01 on admission. Likely due to cardiorenal syndrome. - US-renal- shows medical renal disease - Follow up renal function by BMP  Elevated troponin and CAD: Patient does not have chest pain. Troponin 0.13, most likely due to demanding ischemia secondary to congestive heart failure exacerbation. -follow up 2d echo -trop x 3 -ASA -start lipitor  Gout: stable. -Continue allopurinol  Code Status: full Family Communication:  Disposition Plan:    Consultants:    Procedures:      HPI/Subjective: Breathing improved today  Objective: Filed Vitals:   08/05/15 0526  BP: 122/57  Pulse: 60  Temp: 97.7 F (36.5 C)  Resp: 18    Intake/Output Summary (Last 24 hours) at 08/05/15 0844 Last data filed at 08/05/15 0700  Gross per 24 hour  Intake   1210 ml  Output    900 ml  Net    310 ml   Filed Weights   08/04/15 0018 08/05/15 0526  Weight: 88.9 kg (195 lb 15.8 oz) 90.4 kg (199 lb 4.7 oz)    Exam:   General:  Hard of hearing  Cardiovascular: rrr  Respiratory: clear, no wheezing  Abdomen: +BS ,soft  Musculoskeletal: min edema   Data Reviewed: Basic Metabolic Panel:  Recent Labs Lab 08/03/15 1736  08/04/15 0615 08/05/15 0518  NA 135 134* 134*  K 3.9 4.6 4.1  CL 103 99* 99*  CO2 22 26 26   GLUCOSE 93 125* 100*  BUN 49* 52* 57*  CREATININE 2.01* 2.07* 2.42*  CALCIUM 8.9 9.2 8.8*  MG  --  2.1  --    Liver Function Tests: No results for input(s): AST, ALT, ALKPHOS, BILITOT, PROT, ALBUMIN in the last 168 hours. No results for input(s): LIPASE, AMYLASE in the last 168 hours. No results for input(s): AMMONIA in the last 168 hours. CBC:  Recent Labs Lab 08/03/15 1736  WBC 8.0  HGB 9.3*  HCT 28.2*  MCV 90.7  PLT 150   Cardiac Enzymes:  Recent Labs Lab 08/03/15 2101 08/04/15 0133 08/04/15 0615 08/04/15 1218  TROPONINI 0.13* 0.11* 0.09* 0.08*   BNP (last 3 results)  Recent Labs  08/03/15 1845  BNP 626.1*    ProBNP (last 3 results)  Recent Labs  11/06/14 1255  PROBNP 2232.0*    CBG: No results for input(s): GLUCAP in the last 168 hours.  No results found for this or any previous visit (from the past 240 hour(s)).   Studies: Dg Chest 2 View  08/04/2015   CLINICAL DATA:  Acute kidney injury. History of pacemaker, coronary artery disease and hypertension.  EXAM: CHEST  2 VIEW  COMPARISON:  08/03/2015 and 11/07/2014.  FINDINGS: The left subclavian pacemaker leads appear unchanged within the right atrium and right ventricle. There is stable cardiomegaly  with enlargement of the central pulmonary arteries and central vascular congestion. The lung bases are under aerated on the lateral view without consolidation. There is no significant pleural effusion. The bones appear unchanged. The subacromial space of both shoulders is obliterated, consistent with chronic rotator cuff tears.  IMPRESSION: Stable cardiomegaly, vascular congestion and bibasilar atelectasis. No consolidation.   Electronically Signed   By: Carey Bullocks M.D.   On: 08/04/2015 09:41   Dg Chest 2 View  08/03/2015   CLINICAL DATA:  Shortness of breath.  EXAM: CHEST  2 VIEW  COMPARISON:  11/07/2014   FINDINGS: Left-sided pacemaker is unchanged. Lungs are hypoinflated without evidence of effusion or pneumothorax. There is mild opacification over the mid to posterior lung base on the lateral film as cannot exclude atelectasis or infection. There is moderate stable cardiomegaly. Moderate prominence of the main pulmonary artery segment. Minimal loss of mid anterior vertebral body height of a vertebral body near the thoraco lumbar junction.  IMPRESSION: Mild patchy opacification over the mid to posterior lung base on the lateral film as cannot exclude atelectasis or infection.  Stable cardiomegaly and stable moderate prominence of the main pulmonary artery segment.  Mild loss of mid anterior vertebral body height of a vertebral body near the thoracolumbar junction.   Electronically Signed   By: Elberta Fortis M.D.   On: 08/03/2015 19:25   Nm Pulmonary Perfusion  08/04/2015   CLINICAL DATA:  74 year old female with acute respiratory distress since yesterday evening.  EXAM: NUCLEAR MEDICINE VENTILATION AND PERFUSION SCAN  TECHNIQUE: Perfusion images were obtained in multiple projections after intravenous injection of radiopharmaceutical. Sequential dynamic ventilation images were obtained in standard planar projections following inhalation of radiopharmaceutical.  RADIOPHARMACEUTICALS:  3.28 mCi Tc60m MAA IV (ventilation scan not performed secondary to patient's inability to follow breathing instructions)  COMPARISON:  No priors.  FINDINGS: Ventilation scan not performed secondary to patient's inability to adequately follow breathing instructions due to severe shortness of breath. Perfusion scan demonstrates no perfusion deficits.  IMPRESSION: Normal perfusion study.  No evidence of pulmonary embolism.   Electronically Signed   By: Trudie Reed M.D.   On: 08/04/2015 14:56   US Renal  08/04/2015   CLINICAL DATA:  Acute kidney injury, history hypertension  EXAM: RENAL / URINARY TRACT ULTRASOUND COMPLETE   COMPARISON:  None  FINDINGS: Right Kidney:  Length: 10.2 cm. Thinning and increased echogenicity of cortex. No mass, hydronephrosis or shadowing calcification.  Left Kidney:  Length: 10.3 cm. Thinning and increased echogenicity of cortex. No mass, hydronephrosis or shadowing calcification.  Bladder:  Decompressed, patient voided prior to exam, unable to assess.  IMPRESSION: BILATERAL renal cortical atrophy and medical renal disease changes.  No acute abnormalities.   Electronically Signed   By: Ulyses Southward M.D.   On: 08/04/2015 16:37    Scheduled Meds: . allopurinol  100 mg Oral Daily  . aspirin EC  81 mg Oral Daily  . atorvastatin  40 mg Oral q1800  . azithromycin  500 mg Intravenous Q24H  . cefTRIAXone (ROCEPHIN)  IV  1 g Intravenous Q24H  . [START ON 08/06/2015] furosemide  40 mg Oral Daily  . heparin  5,000 Units Subcutaneous 3 times per day  . sodium chloride  3 mL Intravenous Q12H  . verapamil  180 mg Oral QHS   Continuous Infusions:  Antibiotics Given (last 72 hours)    Date/Time Action Medication Dose Rate   08/04/15 1616 Given   cefTRIAXone (ROCEPHIN) 1 g in dextrose  5 % 50 mL IVPB 1 g 100 mL/hr   08/04/15 2240 Given   azithromycin (ZITHROMAX) 500 mg in dextrose 5 % 250 mL IVPB 500 mg 250 mL/hr      Principal Problem:   Acute on chronic systolic congestive heart failure Active Problems:   S/P placement of cardiac pacemaker   HTN (hypertension), malignant   Acute renal failure superimposed on stage 3 chronic kidney disease   Coronary artery disease, non-occlusive by cath   Gout   Acute respiratory distress   CHF exacerbation    Time spent: 25 min    Bosten Newstrom  [facility] Pager [phone]. If 7PM-7AM, please contact night-coverage at [email or web address], password [facility] 08/05/2015, 8:44 AM  LOS: 2 days

## 2015-08-06 DIAGNOSIS — R7989 Other specified abnormal findings of blood chemistry: Secondary | ICD-10-CM

## 2015-08-06 DIAGNOSIS — I5043 Acute on chronic combined systolic (congestive) and diastolic (congestive) heart failure: Secondary | ICD-10-CM

## 2015-08-06 LAB — HEMOGLOBIN A1C
Hgb A1c MFr Bld: 6.1 % — ABNORMAL HIGH (ref 4.8–5.6)
MEAN PLASMA GLUCOSE: 128 mg/dL

## 2015-08-06 LAB — BASIC METABOLIC PANEL
Anion gap: 6 (ref 5–15)
BUN: 51 mg/dL — ABNORMAL HIGH (ref 6–20)
CALCIUM: 8.9 mg/dL (ref 8.9–10.3)
CHLORIDE: 103 mmol/L (ref 101–111)
CO2: 27 mmol/L (ref 22–32)
CREATININE: 2.04 mg/dL — AB (ref 0.44–1.00)
GFR calc non Af Amer: 23 mL/min — ABNORMAL LOW (ref >60)
GFR, EST AFRICAN AMERICAN: 26 mL/min — AB (ref >60)
Glucose, Bld: 90 mg/dL (ref 65–99)
Potassium: 3.8 mmol/L (ref 3.5–5.1)
SODIUM: 136 mmol/L (ref 135–145)

## 2015-08-06 LAB — PROCALCITONIN: PROCALCITONIN: 0.2 ng/mL

## 2015-08-06 MED ORDER — AZITHROMYCIN 500 MG PO TABS
500.0000 mg | ORAL_TABLET | ORAL | Status: DC
  Administered 2015-08-06 – 2015-08-07 (×2): 500 mg via ORAL
  Filled 2015-08-06 (×3): qty 1

## 2015-08-06 NOTE — Evaluation (Signed)
Occupational Therapy Evaluation Patient Details Name: Anita Wiley [name]: [id number] DOB: September 18, 1941 Today's Date: 08/06/2015    History of Present Illness Patient is a 74 yo female admitted 08/03/15 with SOB.  Scan - for PE per chart (negative)  PMH:  HOH, HTN, CHF, pacemaker, bradycardia, CKD, CAD   Clinical Impression   This 74 yo female admitted with above presents to acute OT at a Mod I level for BADLS in this environment will need A at least initially for IADLs and she is aware of this and had some help pta. No further OT needs in this setting, but will benefit from continued OT at home to address BADLs in her own environment which is much bigger than her hospital room here and to work on IADLs. Acute OT will sign off.    Follow Up Recommendations  Home health OT    Equipment Recommendations  None recommended by OT       Precautions / Restrictions Precautions Precautions: Fall Restrictions Weight Bearing Restrictions: No      Mobility Bed Mobility               General bed mobility comments: Pt up in recliner upon my arrival  Transfers   Equipment used: Rolling walker (2 wheeled) Transfers: Sit to/from Stand Sit to Stand: Modified independent (Device/Increase time)                   ADL Overall ADL's : Modified independent                                       General ADL Comments: Increased time due to DOE, educated pt on purse lipped breathing. says she can sponge bathe until she feel safe enough to step in/out of tub and stand to take a shower     Vision Additional Comments: No change from baseline          Pertinent Vitals/Pain Pain Assessment: No/denies pain     Hand Dominance Right   Extremity/Trunk Assessment Upper Extremity Assessment Upper Extremity Assessment: Overall WFL for tasks assessed           Communication Communication Communication: No difficulties   Cognition Arousal/Alertness:  Awake/alert Behavior During Therapy: WFL for tasks assessed/performed Overall Cognitive Status: Within Functional Limits for tasks assessed                                Home Living Family/patient expects to be discharged to:: Private residence Living Arrangements: Alone Available Help at Discharge: Family;Friend(s);Neighbor;Available PRN/intermittently Type of Home: Apartment Home Access: Level entry     Home Layout: One level     Bathroom Shower/Tub: Tub/shower unit;Curtain Shower/tub characteristics: Engineer, building services: Standard     Home Equipment: Toilet riser;Grab bars - tub/shower          Prior Functioning/Environment Level of Independence: Independent with assistive device(s)  Gait / Transfers Assistance Needed: Uses cane for ambulation. ADL's / Homemaking Assistance Needed: Friends/neighbors assist with housekeeping, laundry   Comments: Friends and family provide rides for errands.    OT Diagnosis: Generalized weakness   OT Problem List: Decreased strength;Cardiopulmonary status limiting activity      OT Goals(Current goals can be found in the care plan section) Acute Rehab OT Goals Patient Stated Goal: To go home   OT Frequency:  End of Session Equipment Utilized During Treatment: Engineer, water Communication:  (RN saw pt walking in hallway with me)  Activity Tolerance:  (mod DOE with ambulating 40 feet on RA, O2 100% and HR 70) Patient left: in chair;with call bell/phone within reach   Time: 4098-1191 OT Time Calculation (min): 18 min Charges:  OT General Charges $OT Visit: 1 Procedure OT Evaluation $Initial OT Evaluation Tier I: 1 Procedure  Evette Georges [phone] 08/06/2015, 4:40 PM

## 2015-08-06 NOTE — Research (Signed)
[email or web address] Informed Consent   Subject Name: Anita Wiley  Subject met inclusion and exclusion criteria.  The informed consent form, study requirements and expectations were reviewed with the subject and questions and concerns were addressed prior to the signing of the consent form.  The subject verbalized understanding of the trail requirements.  The subject agreed to participate in the [email or web address] trial and signed the informed consent.  The informed consent was obtained prior to performance of any protocol-specific procedures for the subject.  A copy of the signed informed consent was given to the subject and a copy was placed in the subject's medical record.  [name] 08/06/2015, 16:30  

## 2015-08-06 NOTE — Progress Notes (Signed)
Physical Therapy Treatment Patient Details Name: Anita Wiley [name]: [id number] DOB: [birth year] Today's Date: August 12, 2015    History of Present Illness Patient is a 74 yo female admitted 08/03/15 with SOB.  Scan - for PE per chart.  PMH:  HOH, HTN, CHF, pacemaker, bradycardia, CKD, CAD    PT Comments    Pt with excellent progression with mobility today able to maintain sats 96-98% on RA throughout with HR 67-80. Pt with increased distance and stability with use of RW and recommend continued use of rW. Also educated for HEP. Will continue to follow.   Follow Up Recommendations  Home health PT     Equipment Recommendations  Rolling walker with 5" wheels    Recommendations for Other Services       Precautions / Restrictions Precautions Precautions: Fall    Mobility  Bed Mobility Overal bed mobility: Modified Independent             General bed mobility comments: Increased time  Transfers Overall transfer level: Modified independent                  Ambulation/Gait Ambulation/Gait assistance: Supervision Ambulation Distance (Feet): 300 Feet Assistive device: Rolling walker (2 wheeled) Gait Pattern/deviations: Step-through pattern;Decreased stride length;Shuffle   Gait velocity interpretation: Below normal speed for age/gender General Gait Details: cues for posture, pt with increased balance and stability with RW use   Stairs            Wheelchair Mobility    Modified Rankin (Stroke Patients Only)       Balance                                    Cognition Arousal/Alertness: Awake/alert Behavior During Therapy: WFL for tasks assessed/performed Overall Cognitive Status: Within Functional Limits for tasks assessed                      Exercises General Exercises - Lower Extremity Long Arc Quad: AROM;Seated;Both;20 reps Hip ABduction/ADduction: AROM;Seated;Both;20 reps Hip Flexion/Marching: AROM;Seated;Both;20 reps Toe  Raises: AROM;Seated;Both;20 reps Heel Raises: AROM;Seated;Both;20 reps    General Comments        Pertinent Vitals/Pain Pain Assessment: No/denies pain    Home Living                      Prior Function            PT Goals (current goals can now be found in the care plan section) Progress towards PT goals: Progressing toward goals    Frequency       PT Plan Discharge plan needs to be updated    Co-evaluation             End of Session   Activity Tolerance: Patient tolerated treatment well Patient left: in chair;with call bell/phone within reach     Time: 1150-1216 PT Time Calculation (min) (ACUTE ONLY): 26 min  Charges:  $Gait Training: 8-22 mins $Therapeutic Exercise: 8-22 mins                    G Codes:      Delorse Lek August 12, 2015, 12:32 PM Delaney Meigs, PT [phone]

## 2015-08-06 NOTE — Progress Notes (Signed)
Patient ID: Anita Wiley, female   DOB: 1941/01/01, 74 y.o.   [name]: [id number] [facility] PROGRESS NOTE  Huberta Tompkins NFA:[id number] DOB: 1941-08-13 DOA: 08/03/2015 PCP: Angela Adam, MD  Brief narrative:    74 y.o. female with past medical history of hypertension, chronic systolic and diastolic CHF (2 D ECHO on this admission with EF of 55% and grade 3 diastolic dysfunction), pacemaker, hypertension, GERD, gout who presented to [facility] ED with progressively worsening shortness of breath for past few days prior to this admission.No associated chest pain or palpitations. No fevers or cough. On admission, patient was found to have troponin level of 0.13, BNP 626. CXR showed mild patchy opacification over the mid to posterior lung base and stable cardiomegaly. V/Q scan is negative.   Anticipated discharge: If pt feels better tomorrow and she is assessed by PT then she could possibly be discharged tomorrow 08/07/2015.   Assessment/Plan:    Principal Problem: Acute respiratory failure with hypoxia / Lobar pneumonia / Elevated D dimer  - Pt now saturating 98 - 100% with [place] oxygen support - V/Q scan done and showed no pulmonary embolism  - CXR on admission showed posterior lung base opacification. She was started on empiric rocephin and azithromycin. - She did have elevated BNP on admission and has received IV lasix (now on PO lasix) - Stable respiratory status   Active Problems: Acute on chronic systolic and diastolic CHF - 2 D ECHO on this admission with EF of 55% and grade 3 diastolic dysfunction - BNP elevated at 600 range on this admission - Troponin level 0.11 --> 0.09 --> 0.08 - She has received IV lasix but no on PO lasix - Weight in past 72 hours: 88.9 kg --> 90.4 kg --> 89.9 kg - Continue aspirin, statin and verapamil  - Pt not on ACEi due to renal insufficiency  Essential HTN (hypertension) - Continue lasix and verapamil  - She should be on coreg and metoprolol but on hold due to  bradycardia of 50 over past 24 hours.  - Patient is also on verapamil  Acute on chronic kidney disease, stage 3 - Baseline creatinine is 1.0 - 1.2 - Acute elevation likely due to cardiorenal syndrome, lasix - Will see how renal function is tomorrow but we may need to reduce lasix dose if creatinine worsens - US-renal- shows medical renal disease  Elevated troponin and CAD - Likely demand ischemia from worsening CKD - No reports of chest pain - Troponin level only mildly elevated and has trended down  - Continue aspirin and Lipitor  Gout - Stable. - Continue allopurinol  DVT Prophylaxis  - Heparin subQ ordered    Code Status: Full.  Family Communication:  plan of care discussed with the patient; family not at the bedside this am  Disposition Plan: Needs PT evaluation   IV access:  Peripheral IV  Procedures and diagnostic studies:    Dg Chest 2 View 08/04/2015  Stable cardiomegaly, vascular congestion and bibasilar atelectasis. No consolidation.   Electronically Signed   By: Carey Bullocks M.D.   On: 08/04/2015 09:41   Dg Chest 2 View 08/03/2015   Mild patchy opacification over the mid to posterior lung base on the lateral film as cannot exclude atelectasis or infection.  Stable cardiomegaly and stable moderate prominence of the main pulmonary artery segment.  Mild loss of mid anterior vertebral body height of a vertebral body near the thoracolumbar junction.   Electronically Signed   By: Elberta Fortis M.D.  On: 08/03/2015 19:25   Nm Pulmonary Perfusion 08/04/2015  Normal perfusion study.  No evidence of pulmonary embolism.   Electronically Signed   By: Trudie Reed M.D.   On: 08/04/2015 14:56   US Renal 08/04/2015  BILATERAL renal cortical atrophy and medical renal disease changes.  No acute abnormalities.    Medical Consultants:  None   Other Consultants:  Physical therapy  IAnti-Infectives:   Azithromycin  Rocephin   Manson Passey, MD  [facility] Pager  [phone]  Time spent in minutes: 25 minutes  If 7PM-7AM, please contact night-coverage [email or web address] Password [email or web address] 08/06/2015, 6:45 PM   LOS: 3 days    HPI/Subjective: No acute overnight events. Patient reports some shortness of breath.  Objective: Filed Vitals:   08/06/15 0502 08/06/15 1222 08/06/15 1402 08/06/15 1602  BP: 134/56  124/60   Pulse: 50 67  70  Temp: 97.5 F (36.4 C)  97.5 F (36.4 C)   TempSrc: Oral  Oral   Resp: 18  18   Height:      Weight: 89.93 kg (198 lb 4.2 oz)     SpO2: 100% 98% 99% 100%    Intake/Output Summary (Last 24 hours) at 08/06/15 1845 Last data filed at 08/06/15 1400  Gross per 24 hour  Intake    610 ml  Output   2050 ml  Net  -1440 ml    Exam:   General:  Pt is alert, follows commands appropriately, not in acute distress  Cardiovascular: Regular rate and rhythm, S1/S2, no murmurs  Respiratory: coarse sounds, no wheezing  Abdomen: Soft, non tender, non distended, bowel sounds present  Extremities: No pedal edema, pulses palpable bilaterally  Neuro: Grossly nonfocal  Data Reviewed: Basic Metabolic Panel:  Recent Labs Lab 08/03/15 1736 08/04/15 0615 08/05/15 0518 08/06/15 0431  NA 135 134* 134* 136  K 3.9 4.6 4.1 3.8  CL 103 99* 99* 103  CO2 22 26 26 27   GLUCOSE 93 125* 100* 90  BUN 49* 52* 57* 51*  CREATININE 2.01* 2.07* 2.42* 2.04*  CALCIUM 8.9 9.2 8.8* 8.9  MG  --  2.1  --   --    Liver Function Tests: No results for input(s): AST, ALT, ALKPHOS, BILITOT, PROT, ALBUMIN in the last 168 hours. No results for input(s): LIPASE, AMYLASE in the last 168 hours. No results for input(s): AMMONIA in the last 168 hours. CBC:  Recent Labs Lab 08/03/15 1736  WBC 8.0  HGB 9.3*  HCT 28.2*  MCV 90.7  PLT 150   Cardiac Enzymes:  Recent Labs Lab 08/03/15 2101 08/04/15 0133 08/04/15 0615 08/04/15 1218  TROPONINI 0.13* 0.11* 0.09* 0.08*   BNP: Invalid input(s): POCBNP CBG: No results for input(s): GLUCAP in  the last 168 hours.  No results found for this or any previous visit (from the past 240 hour(s)).   Scheduled Meds: . allopurinol  100 mg Oral Daily  . aspirin EC  81 mg Oral Daily  . atorvastatin  40 mg Oral q1800  . azithromycin  500 mg Oral Q24H  . cefTRIAXone (ROCEPHIN)  IV  1 g Intravenous Q24H  . furosemide  40 mg Oral Daily  . heparin  5,000 Units Subcutaneous 3 times per day  . verapamil  180 mg Oral QHS

## 2015-08-06 NOTE — Care Management Important Message (Signed)
Important Message  Patient Details  Name: Anita Wiley [name]: [id number] Date of Birth: 05-25-1941   Medicare Important Message Given:  Yes-second notification given    Yvonna Alanis 08/06/2015, 3:37 PM

## 2015-08-07 DIAGNOSIS — I5023 Acute on chronic systolic (congestive) heart failure: Secondary | ICD-10-CM

## 2015-08-07 DIAGNOSIS — I10 Essential (primary) hypertension: Secondary | ICD-10-CM

## 2015-08-07 DIAGNOSIS — J80 Acute respiratory distress syndrome: Secondary | ICD-10-CM

## 2015-08-07 DIAGNOSIS — I2510 Atherosclerotic heart disease of native coronary artery without angina pectoris: Secondary | ICD-10-CM

## 2015-08-07 DIAGNOSIS — I509 Heart failure, unspecified: Secondary | ICD-10-CM

## 2015-08-07 DIAGNOSIS — Z950 Presence of cardiac pacemaker: Secondary | ICD-10-CM

## 2015-08-07 DIAGNOSIS — N183 Chronic kidney disease, stage 3 (moderate): Secondary | ICD-10-CM

## 2015-08-07 DIAGNOSIS — M10072 Idiopathic gout, left ankle and foot: Secondary | ICD-10-CM

## 2015-08-07 DIAGNOSIS — N179 Acute kidney failure, unspecified: Secondary | ICD-10-CM

## 2015-08-07 LAB — CBC
HCT: 27.8 % — ABNORMAL LOW (ref 36.0–46.0)
Hemoglobin: 9 g/dL — ABNORMAL LOW (ref 12.0–15.0)
MCH: 29.1 pg (ref 26.0–34.0)
MCHC: 32.4 g/dL (ref 30.0–36.0)
MCV: 90 fL (ref 78.0–100.0)
Platelets: 159 10*3/uL (ref 150–400)
RBC: 3.09 MIL/uL — ABNORMAL LOW (ref 3.87–5.11)
RDW: 14.8 % (ref 11.5–15.5)
WBC: 6.5 10*3/uL (ref 4.0–10.5)

## 2015-08-07 LAB — BASIC METABOLIC PANEL
Anion gap: 8 (ref 5–15)
BUN: 39 mg/dL — AB (ref 6–20)
CALCIUM: 8.8 mg/dL — AB (ref 8.9–10.3)
CHLORIDE: 100 mmol/L — AB (ref 101–111)
CO2: 29 mmol/L (ref 22–32)
CREATININE: 1.5 mg/dL — AB (ref 0.44–1.00)
GFR calc non Af Amer: 33 mL/min — ABNORMAL LOW (ref >60)
GFR, EST AFRICAN AMERICAN: 38 mL/min — AB (ref >60)
GLUCOSE: 98 mg/dL (ref 65–99)
Potassium: 3.4 mmol/L — ABNORMAL LOW (ref 3.5–5.1)
Sodium: 137 mmol/L (ref 135–145)

## 2015-08-07 MED ORDER — INDOMETHACIN 50 MG PO CAPS
50.0000 mg | ORAL_CAPSULE | Freq: Three times a day (TID) | ORAL | Status: DC
  Administered 2015-08-07 – 2015-08-08 (×3): 50 mg via ORAL
  Filled 2015-08-07 (×6): qty 1

## 2015-08-07 MED ORDER — METHYLPREDNISOLONE SODIUM SUCC 125 MG IJ SOLR
60.0000 mg | Freq: Three times a day (TID) | INTRAMUSCULAR | Status: DC
  Administered 2015-08-07 – 2015-08-08 (×4): 60 mg via INTRAVENOUS
  Filled 2015-08-07 (×4): qty 2

## 2015-08-07 MED ORDER — POTASSIUM CHLORIDE CRYS ER 20 MEQ PO TBCR
40.0000 meq | EXTENDED_RELEASE_TABLET | Freq: Four times a day (QID) | ORAL | Status: AC
  Administered 2015-08-07 (×2): 40 meq via ORAL
  Filled 2015-08-07 (×2): qty 2

## 2015-08-07 NOTE — Progress Notes (Signed)
Patient ID: Mairlyn Tegtmeyer, female   DOB: 02-23-1941, 74 y.o.   [name]: [id number] [facility] PROGRESS NOTE  Anijah Spohr WUJ:[id number] DOB: Oct 24, 1941 DOA: 08/03/2015 PCP: Angela Adam, MD   Subjective:   Left ankle pain, patient said he has history of gout. Needs DVT rule out. No shortness of breath.  Brief narrative:    74 y.o. female with past medical history of hypertension, chronic systolic and diastolic CHF (2 D ECHO on this admission with EF of 55% and grade 3 diastolic dysfunction), pacemaker, hypertension, GERD, gout who presented to [facility] ED with progressively worsening shortness of breath for past few days prior to this admission.No associated chest pain or palpitations. No fevers or cough. On admission, patient was found to have troponin level of 0.13, BNP 626. CXR showed mild patchy opacification over the mid to posterior lung base and stable cardiomegaly. V/Q scan is negative.   Anticipated discharge: PT recommended home with home health, patient developed left ankle pain.  Assessment/Plan:    Principal Problem: Acute respiratory failure with hypoxia / Lobar pneumonia / Elevated D dimer  - Pt now saturating 98 - 100% with [place] oxygen support - V/Q scan done and showed no pulmonary embolism  - CXR on admission showed posterior lung base opacification. She was started on empiric rocephin and azithromycin. - She did have elevated BNP on admission and has received IV lasix (now on PO lasix) - Stable respiratory status   Active Problems: Acute on chronic systolic and diastolic CHF - 2 D ECHO on this admission with EF of 55% and grade 3 diastolic dysfunction - BNP elevated at 600 range on this admission - Troponin level 0.11 --> 0.09 --> 0.08 - She has received IV lasix but no on PO lasix - Weight in past 72 hours: 88.9 kg --> 90.4 kg --> 89.9 kg - Continue aspirin, statin and verapamil  - Pt not on ACEi due to renal insufficiency  Essential HTN (hypertension) - Continue lasix  and verapamil  - She should be on coreg and metoprolol but on hold due to bradycardia of 50 over past 24 hours.  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 - Continue allopurinol  DVT Prophylaxis  - Heparin subQ ordered    Code Status: Full.  Family Communication:  plan of care discussed with the patient; family not at the bedside this am  Disposition Plan: Needs PT evaluation   IV access:  Peripheral IV  Procedures and diagnostic studies:    Dg Chest 2 View 08/04/2015  Stable cardiomegaly, vascular congestion and bibasilar atelectasis. No consolidation.   Electronically Signed   By: Carey Bullocks M.D.   On: 08/04/2015 09:41   Dg Chest 2 View 08/03/2015   Mild patchy opacification over the mid to posterior lung base on the lateral film as cannot exclude atelectasis or infection.  Stable cardiomegaly and stable moderate prominence of the main  pulmonary artery segment.  Mild loss of mid anterior vertebral body height of a vertebral body near the thoracolumbar junction.   Electronically Signed   By: Elberta Fortis M.D.   On: 08/03/2015 19:25   Nm Pulmonary Perfusion 08/04/2015  Normal perfusion study.  No evidence of pulmonary embolism.   Electronically Signed   By: Trudie Reed M.D.   On: 08/04/2015 14:56   US Renal 08/04/2015  BILATERAL renal cortical atrophy and medical renal disease changes.  No  acute abnormalities.    Medical Consultants:  None   Other Consultants:  Physical therapy  IAnti-Infectives:   Azithromycin  Rocephin   Clydia Llano A, MD  [facility] Pager [phone]  Time spent in minutes: 25 minutes  If 7PM-7AM, please contact night-coverage [email or web address] Password [email or web address] 08/07/2015, 11:02 AM   LOS: 4 days    HPI/Subjective: No acute overnight events. Patient reports some shortness of breath.  Objective: Filed Vitals:   08/06/15 1602 08/06/15 2025 08/07/15 0416 08/07/15 1058  BP:  155/78 153/56 150/60  Pulse: 70 76 66 64  Temp:  97.8 F (36.6 C) 97.7 F (36.5 C) 98 F (36.7 C)  TempSrc:  Oral Oral Oral  Resp:  18 18   Height:      Weight:   88.9 kg (195 lb 15.8 oz)   SpO2: 100% 100% 98% 100%    Intake/Output Summary (Last 24 hours) at 08/07/15 1102 Last data filed at 08/07/15 0900  Gross per 24 hour  Intake    480 ml  Output   2300 ml  Net  -1820 ml    Exam:   General:  Pt is alert, follows commands appropriately, not in acute distress  Cardiovascular: Regular rate and rhythm, S1/S2, no murmurs  Respiratory: coarse sounds, no wheezing  Abdomen: Soft, non tender, non distended, bowel sounds present  Extremities: No pedal edema, pulses palpable bilaterally  Neuro: Grossly nonfocal  Data Reviewed: Basic Metabolic Panel:  Recent Labs Lab 08/03/15 1736 08/04/15 0615 08/05/15 0518 08/06/15 0431 08/07/15 0510  NA 135 134* 134* 136 137  K 3.9 4.6 4.1 3.8 3.4*  CL 103 99* 99* 103 100*  CO2 22 26 26 27 29   GLUCOSE 93 125* 100* 90 98  BUN 49* 52* 57* 51* 39*  CREATININE 2.01* 2.07* 2.42* 2.04* 1.50*  CALCIUM 8.9 9.2 8.8* 8.9 8.8*  MG  --  2.1  --   --   --    Liver Function Tests: No results for input(s): AST, ALT, ALKPHOS, BILITOT, PROT, ALBUMIN in the last 168 hours. No results for input(s): LIPASE, AMYLASE in the last 168 hours. No results for input(s): AMMONIA in the last 168 hours. CBC:  Recent Labs Lab  08/03/15 1736 08/07/15 0510  WBC 8.0 6.5  HGB 9.3* 9.0*  HCT 28.2* 27.8*  MCV 90.7 90.0  PLT 150 159   Cardiac Enzymes:  Recent Labs Lab 08/03/15 2101 08/04/15 0133 08/04/15 0615 08/04/15 1218  TROPONINI 0.13* 0.11* 0.09* 0.08*   BNP: Invalid input(s): POCBNP CBG: No results for input(s): GLUCAP in the last 168 hours.  No results found for this or any previous visit (from the past 240 hour(s)).   Scheduled Meds: . allopurinol  100 mg Oral Daily  . aspirin EC  81 mg Oral Daily  . atorvastatin  40 mg Oral q1800  . azithromycin  500 mg Oral Q24H  . cefTRIAXone (ROCEPHIN)  IV  1 g Intravenous Q24H  . furosemide  40 mg Oral Daily  .  heparin  5,000 Units Subcutaneous 3 times per day  . verapamil  180 mg Oral QHS

## 2015-08-07 NOTE — Care Management Note (Signed)
Case Management Note  Patient Details  Name: Anita Wiley [name]: [id number] Date of Birth: [birth year]  Subjective/Objective:       Admitted with CHF             Action/Plan: Patient lives alone in [place]. She states that she has supportive sisters (3) that live close by. Patient has several canes that she use and puts them in various places around her home. Patient has private insurance with [facility] with prescription drug coverage- no problem getting her medication. Pharmacy of choice is [facility]. Patient could benefit from a Disease Management Program for CHF- patient politely refused all HHC services at this time. CM will continue to follow for DCP.   Expected Discharge Date:     Possibly 08/09/2015             Expected Discharge Plan:  Home/Self Care  Discharge planning Services  CM Consult     Choice offered to:  Patient  HH Arranged:  Patient Refused  Status of Service:  In process, will continue to follow  Medicare Important Message Given:  [facility] notification given  Reola Mosher [phone] 08/07/2015, 10:36 AM

## 2015-08-08 ENCOUNTER — Inpatient Hospital Stay (HOSPITAL_COMMUNITY): Payer: Medicare Other

## 2015-08-08 DIAGNOSIS — M79606 Pain in leg, unspecified: Secondary | ICD-10-CM

## 2015-08-08 LAB — BASIC METABOLIC PANEL
ANION GAP: 8 (ref 5–15)
BUN: 30 mg/dL — ABNORMAL HIGH (ref 6–20)
CALCIUM: 9.5 mg/dL (ref 8.9–10.3)
CHLORIDE: 102 mmol/L (ref 101–111)
CO2: 28 mmol/L (ref 22–32)
Creatinine, Ser: 1.34 mg/dL — ABNORMAL HIGH (ref 0.44–1.00)
GFR calc non Af Amer: 37 mL/min — ABNORMAL LOW (ref >60)
GFR, EST AFRICAN AMERICAN: 43 mL/min — AB (ref >60)
GLUCOSE: 155 mg/dL — AB (ref 65–99)
POTASSIUM: 4.1 mmol/L (ref 3.5–5.1)
Sodium: 138 mmol/L (ref 135–145)

## 2015-08-08 LAB — PROCALCITONIN: Procalcitonin: <0.1 ng/mL

## 2015-08-08 MED ORDER — AZITHROMYCIN 500 MG PO TABS: 500.0000 mg | ORAL_TABLET | Freq: Every day | ORAL | Status: DC

## 2015-08-08 MED ORDER — CARVEDILOL 12.5 MG PO TABS: 12.5000 mg | ORAL_TABLET | Freq: Two times a day (BID) | ORAL | Status: AC

## 2015-08-08 MED ORDER — CEFUROXIME AXETIL 500 MG PO TABS: 500.0000 mg | ORAL_TABLET | Freq: Two times a day (BID) | ORAL | Status: DC

## 2015-08-08 NOTE — Progress Notes (Signed)
VASCULAR LAB PRELIMINARY  PRELIMINARY  PRELIMINARY  PRELIMINARY  Bilateral lower extremity venous duplex  completed.    Preliminary report:  Bilateral:  No evidence of DVT, superficial thrombosis, or Baker's Cyst.    Ashea Winiarski, RVT 08/08/2015, 12:01 PM

## 2015-08-08 NOTE — Progress Notes (Signed)
Heart Failure Navigator Consult Note  Presentation: Anita Wiley is a 74 y.o. female with PMH of poor hearing, hypertension, GERD, systolic heart failure (EF of 16-10%), gout, pacemaker placement, bradycardia, CKD-III, CAD, who is with the shortness of breath.  Patient reports that in the past several days she has been having progressively worsening shortness of breath. She has orthopnea. She has mild cough which is chronic and has not changed. She does not have fever, chills, chest pain. No sputum production. She experienced one episode of nausea and nonbloody nonbilious emesis yesterday evening, no abdominal pain. Currently no nausea, vomiting or diarrhea. Patient does not have symptoms of UTI, unilateral weakness, rashes.  In ED, patient was found to have troponin 0.13, BNP 626.1, WBC 8.0, temperature normal, bradycardia, worsening renal function. Chest x-ray mild patchy opacification over the mid to posterior lung base on the lateral film as cannot exclude atelectasis or infection,stable cardiomegaly and stable moderate prominence of the main pulmonary artery segment.   Past Medical History  Diagnosis Date  . Hypertension   . Bradycardia     s/p pacemaker in 2010  . Chronic kidney disease   . Gout   . Pacemaker 2010  . Arthritis     OSTEO  . Gout     Social History   Social History  . Marital Status: Widowed    Spouse Name: N/A  . Number of Children: N/A  . Years of Education: N/A   Social History Main Topics  . Smoking status: Never Smoker   . Smokeless tobacco: Never Used  . Alcohol Use: No  . Drug Use: No  . Sexual Activity: Not Asked   Other Topics Concern  . None   Social History Narrative    ECHO:Study Conclusions--08/05/15 - Left ventricle: The cavity size was normal. There was moderate concentric hypertrophy. Systolic function was normal. The estimated ejection fraction was in the range of 55% to 60%. Wall motion was normal; there were no regional wall  motion abnormalities. There was a reduced contribution of atrial contraction to ventricular filling, due to increased ventricular diastolic pressure or atrial contractile dysfunction. Doppler parameters are consistent with a reversible restrictive pattern, indicative of decreased left ventricular diastolic compliance and/or increased left atrial pressure (grade 3 diastolic dysfunction). Doppler parameters are consistent with high ventricular filling pressure. - Aortic valve: Trileaflet; mildly thickened leaflets. There was trivial regurgitation. - Mitral valve: There was mild to moderate regurgitation. - Left atrium: The atrium was moderately dilated. - Tricuspid valve: There was moderate-severe regurgitation. - Pulmonary arteries: PA peak pressure: 66 mm Hg (S).  Impressions:  - The right ventricular systolic pressure was increased consistent with moderate pulmonary hypertension.  BNP    Component Value Date/Time   BNP 626.1* 08/03/2015 1845    ProBNP    Component Value Date/Time   PROBNP 2232.0* 11/06/2014 1255     Education Assessment and Provision:  Detailed education and instructions provided on heart failure disease management including the following:  Signs and symptoms of Heart Failure When to call the physician Importance of daily weights Low sodium diet Fluid restriction Medication management Anticipated future follow-up appointments  Patient education given on each of the above topics.  Patient acknowledges understanding and acceptance of all information provided.  I spoke with Ms. Schewe about her HF.  She tells me that she does not have a scale and has not been weighing each day.   I will provide a scale for her and have reinforced the importance of daily  weights and how they relate to the signs and symptoms of HF.  She quit "eating salt in 1970".   I emphasized the sodium content in foods and for her to also be careful about choosing low  sodium foods to eat as well as avoid high sodium foods.  She denies any issues with getting or taking prescribed medications.  She says that either someone picks them up for her or they are delivered.  She lives alone and sister helps her frequently.  She denies any issue with transportation to and from appts.  She follows with a cardiologist in [place] close to her home.  Education Materials:  "Living Better With Heart Failure" Booklet, Daily Weight Tracker Tool    High Risk Criteria for Readmission and/or Poor Patient Outcomes:   EF <30%- No 55-60% with grade 3 dias dys  2 or more admissions in 6 months- No  Difficult social situation- No--? Ability to provide her own care  Demonstrates medication noncompliance- Denies    Barriers of Care:  Knowledge and compliance related to her ability   Discharge Planning:  Plans to return to home alone.  She would greatly benefit from [facility] for ongoing education/ compliance reinforcement as well as symptom recognition.

## 2015-08-08 NOTE — Progress Notes (Signed)
Patient alert oriented, no c/o pain or shortness of breath. Iv d/c, discharge instruction explain and given to patient , patient verbalized understanding. D/c patient home per order.

## 2015-08-08 NOTE — Progress Notes (Signed)
Physical Therapy Treatment Patient Details Name: Anita Wiley [name]: [id number] DOB: 02/11/1941 Today's Date: 08/08/2015    History of Present Illness Patient is a 74 yo female admitted 08/03/15 with SOB.  Scan - for PE per chart (negative)  PMH:  HOH, HTN, CHF, pacemaker, bradycardia, CKD, CAD    PT Comments    Pt continues to progress with ambulation, ambulated 250' on RA today with O2 sats 97% and HR 81 bpm. Discussed the need for HHPT as pt with decreased gait speed and balance deficits, indicative of increased fall risk. When HHPT further explained, pt was agreeable to this for short term. PT will continue to follow.   Follow Up Recommendations  Home health PT     Equipment Recommendations  Rolling walker with 5" wheels    Recommendations for Other Services       Precautions / Restrictions Precautions Precautions: Fall Restrictions Weight Bearing Restrictions: No    Mobility  Bed Mobility Overal bed mobility: Independent             General bed mobility comments: pt in and out of bed independently  Transfers Overall transfer level: Modified independent Equipment used: Rolling walker (2 wheeled) Transfers: Sit to/from Stand Sit to Stand: Modified independent (Device/Increase time)         General transfer comment: pt stood safely from bed and toilet without assist  Ambulation/Gait Ambulation/Gait assistance: Modified independent (Device/Increase time) Ambulation Distance (Feet): 250 Feet Assistive device: Rolling walker (2 wheeled) Gait Pattern/deviations: Step-through pattern;Decreased stride length;Trunk flexed Gait velocity: 1.04 ft/sec Gait velocity interpretation: <1.8 ft/sec, indicative of risk for recurrent falls General Gait Details: pt ambulating on RA, dyspnea 2/4, does well on flat, even surfaces but has difficulty making quick changes in direction or accepting challenges   Stairs            Wheelchair Mobility    Modified Rankin  (Stroke Patients Only)       Balance Overall balance assessment: Needs assistance Sitting-balance support: No upper extremity supported Sitting balance-Leahy Scale: Normal     Standing balance support: No upper extremity supported Standing balance-Leahy Scale: Good Standing balance comment: pt can stand with widened BOS without UE asist but loses balance when narrows base or attempts tandem stance, is unable to stand on one leg               High Level Balance Comments: gave pt balance exercises to perform at home including rhomberg stance, tandem and SL within RW    Cognition Arousal/Alertness: Awake/alert Behavior During Therapy: [facility] for tasks assessed/performed Overall Cognitive Status: Within Functional Limits for tasks assessed                      Exercises Other Exercises Other Exercises: practiced sit to stand and stand to sit without use of hands. Pt struggled to control descent all the way to sitting. Did better with sit to stand and challenged her to progress to hands out in front of her. Pt to work on this at home    General Comments        Pertinent Vitals/Pain Pain Assessment: Faces Faces Pain Scale: Hurts little more Pain Location: left foot  O2 sats 97% after ambulation on RA, HR 81 bpm    Home Living                      Prior Function  PT Goals (current goals can now be found in the care plan section) Acute Rehab PT Goals Patient Stated Goal: To go home  PT Goal Formulation: With patient Time For Goal Achievement: 08/11/15 Potential to Achieve Goals: Good Progress towards PT goals: Progressing toward goals    Frequency  Min 3X/week    PT Plan Current plan remains appropriate    Co-evaluation             End of Session Equipment Utilized During Treatment: Gait belt Activity Tolerance: Patient tolerated treatment well Patient left: in bed;with call bell/phone within reach     Time: 0828-0855 PT  Time Calculation (min) (ACUTE ONLY): 27 min  Charges:  $Gait Training: 8-22 mins $Therapeutic Activity: 8-22 mins                    G Codes:     Lyanne Co, PT  Acute Rehab Services  [phone]  Lyanne Co 08/08/2015, 9:04 AM

## 2015-08-08 NOTE — Progress Notes (Signed)
ReDS Vest Discharge Study  Results of ReDS reading  Your patient is in the Unblinded arm of the Vest at Discharge study.  The ReDS reading is:   ( < 39)  Your patient is ok for discharge.    Thank You   The research team   

## 2015-08-08 NOTE — Discharge Summary (Signed)
Physician Discharge Summary  Anita Wiley ZOX:[id number] DOB: 08/28/1941 DOA: 08/03/2015  PCP: Angela Adam, MD  Admit date: 08/03/2015 Discharge date: 08/08/2015  Time spent: 40 minutes  Recommendations for Outpatient Follow-up:  1. Follow-up with primary care physician within one week.  Discharge Diagnoses:  Principal Problem:   Acute on chronic systolic congestive heart failure Active Problems:   S/P placement of cardiac pacemaker   HTN (hypertension), malignant   Acute renal failure superimposed on stage 3 chronic kidney disease   Coronary artery disease, non-occlusive by cath   Gout   Acute respiratory distress   CHF exacerbation   Discharge Condition: Stable  Diet recommendation: Heart healthy  Filed Weights   08/06/15 0502 08/07/15 0416 08/08/15 0419  Weight: 89.93 kg (198 lb 4.2 oz) 88.9 kg (195 lb 15.8 oz) 86.546 kg (190 lb 12.8 oz)    History of present illness:  Anita Wiley is a 74 y.o. female with PMH of poor hearing, hypertension, GERD, systolic heart failure (EF of 81-19%), gout, pacemaker placement, bradycardia, CKD-III, CAD, who is with the shortness of breath.  Patient reports that in the past several days she has been having progressively worsening shortness of breath. She has orthopnea. She has mild cough which is chronic and has not changed. She does not have fever, chills, chest pain. No sputum production. She experienced one episode of nausea and nonbloody nonbilious emesis yesterday evening, no abdominal pain. Currently no nausea, vomiting or diarrhea. Patient does not have symptoms of UTI, unilateral weakness, rashes.  In ED, patient was found to have troponin 0.13, BNP 626.1, WBC 8.0, temperature normal, bradycardia, worsening renal function. Chest x-ray mild patchy opacification over the mid to posterior lung base on the lateral film as cannot exclude atelectasis or infection,stable cardiomegaly and stable moderate prominence of the main pulmonary artery  segment.  Hospital Course:   Acute respiratory failure with hypoxia / Lobar pneumonia / Elevated D dimer  - Pt now saturating 98 - 100% with [place] oxygen support - V/Q scan done and showed no pulmonary embolism  - CXR on admission showed posterior lung base opacification. She was started on empiric rocephin and azithromycin. - She did have elevated BNP on admission and has received IV lasix (now on PO lasix) - Discharged on Ceftin and azithromycin for 5 more days.  Acute on chronic diastolic CHF - 2 D ECHO on this admission with EF of 55% and grade 3 diastolic dysfunction - BNP elevated at 600 range on this admission - Troponin level 0.11 --> 0.09 --> 0.08 - She has received IV lasix but no on PO lasix - Weight in past 72 hours: 88.9 kg --> 90.4 kg --> 89.9 kg - Continue aspirin, statin and verapamil , Pt not on ACEi due to renal insufficiency. -Coreg increased 12.5 mg, can consider BiDil if blood pressure is not controlled as outpatient.  Essential HTN (hypertension) - Continue lasix and verapamil  - She should be on coreg and metoprolol but on hold due to bradycardia of 50 over past 24 hours.  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 - Allopurinol continued. - Patient developed pain again consider steroids taper. Was not discharge and colchicine or indomethacin because of renal  impairment.   Procedures:  None  Consultations:  None  Discharge Exam: Filed Vitals:   08/08/15 1000  BP: 169/86  Pulse: 89  Temp: 98.1 F (36.7 C)  Resp:    General: Alert and awake, oriented x3, not in any acute distress. HEENT: anicteric sclera, pupils reactive to light and accommodation, EOMI CVS: S1-S2 clear, no murmur rubs or gallops Chest: clear to auscultation bilaterally, no wheezing, rales or rhonchi Abdomen: soft nontender, nondistended, normal bowel sounds, no organomegaly Extremities: no cyanosis, clubbing or edema noted bilaterally Neuro: Cranial nerves II-XII intact, no focal neurological deficits  Discharge Instructions   Discharge Instructions    Diet - low sodium heart healthy    Complete by:  As directed      Increase activity slowly    Complete by:  As directed           Current Discharge Medication List    START taking these medications   Details  azithromycin (ZITHROMAX) 500 MG tablet Take 1 tablet (500 mg total) by mouth daily. Qty: 5 tablet, Refills: 0    cefUROXime (CEFTIN) 500 MG tablet Take 1 tablet (500 mg total) by mouth 2 (two) times daily with a meal. Qty: 10 tablet, Refills: 0      CONTINUE these medications which have CHANGED   Details  carvedilol (COREG) 12.5 MG tablet Take 1 tablet (12.5 mg total) by mouth 2 (two) times daily with a meal. Qty: 60 tablet, Refills: 0      CONTINUE these medications which have NOT CHANGED   Details  allopurinol (ZYLOPRIM) 100 MG tablet Take 100 mg by mouth daily.    aspirin 81 MG tablet Take 81 mg by mouth daily.    furosemide (LASIX) 40 MG tablet Take 40 mg by mouth daily.    potassium chloride (K-DUR) 10 MEQ tablet Take 10 mEq by mouth daily.    verapamil (COVERA HS) 180 MG (CO) 24 hr tablet Take 180 mg by mouth at bedtime.      STOP taking these medications     metoprolol succinate (TOPROL-XL) 50 MG 24 hr tablet        Allergies  Allergen Reactions  . Codeine Itching    Follow-up Information    Follow up with AHMED,SYED A, MD In 1 week.   Specialty:  Internal Medicine   Contact information:   [address] [place] [address]        The results of significant diagnostics from this hospitalization (including imaging, microbiology, ancillary and laboratory) are listed below for reference.    Significant Diagnostic Studies: Dg Chest 2 View  08/04/2015   CLINICAL DATA:  Acute kidney injury. History of pacemaker, coronary artery disease and hypertension.  EXAM: CHEST  2 VIEW  COMPARISON:  08/03/2015 and 11/07/2014.  FINDINGS: The left subclavian pacemaker leads appear unchanged within the right atrium and right ventricle. There is stable cardiomegaly with enlargement of the central pulmonary arteries and central vascular congestion. The lung bases are under aerated on the lateral view without consolidation. There is no significant pleural effusion. The bones appear unchanged. The subacromial space of both shoulders is obliterated, consistent with chronic rotator cuff tears.  IMPRESSION: Stable cardiomegaly, vascular congestion and bibasilar atelectasis. No consolidation.   Electronically Signed   By: Carey Bullocks M.D.   On: 08/04/2015 09:41   Dg Chest 2 View  08/03/2015   CLINICAL DATA:  Shortness of breath.  EXAM: CHEST  2 VIEW  COMPARISON:  11/07/2014  FINDINGS: Left-sided pacemaker is unchanged. Lungs are hypoinflated without evidence of effusion or pneumothorax. There is mild opacification over the mid to posterior lung base on the lateral film as cannot exclude atelectasis or infection. There is moderate stable cardiomegaly. Moderate prominence of the main pulmonary artery segment. Minimal loss of mid anterior vertebral body height of a vertebral body near the thoraco lumbar junction.  IMPRESSION: Mild patchy opacification over the mid to posterior lung base on the lateral film as cannot exclude atelectasis or infection.  Stable cardiomegaly and stable  moderate prominence of the main pulmonary artery segment.  Mild loss of mid anterior vertebral body height of a vertebral body near the thoracolumbar junction.   Electronically Signed   By: Elberta Fortis M.D.   On: 08/03/2015 19:25   Nm Pulmonary Perfusion  08/04/2015   CLINICAL DATA:  74 year old female with acute respiratory distress since yesterday evening.  EXAM: NUCLEAR MEDICINE VENTILATION AND PERFUSION SCAN  TECHNIQUE: Perfusion images were obtained in multiple projections after intravenous injection of radiopharmaceutical. Sequential dynamic ventilation images were obtained in standard planar projections following inhalation of radiopharmaceutical.  RADIOPHARMACEUTICALS:  3.28 mCi Tc53m MAA IV (ventilation scan not performed secondary to patient's inability to follow breathing instructions)  COMPARISON:  No priors.  FINDINGS: Ventilation scan not performed secondary to patient's inability to adequately follow breathing instructions due to severe shortness of breath. Perfusion scan demonstrates no perfusion deficits.  IMPRESSION: Normal perfusion study.  No evidence of pulmonary embolism.   Electronically Signed   By: Trudie Reed M.D.   On: 08/04/2015 14:56   US Renal  08/04/2015   CLINICAL DATA:  Acute kidney injury, history hypertension  EXAM: RENAL / URINARY TRACT ULTRASOUND COMPLETE  COMPARISON:  None  FINDINGS: Right Kidney:  Length: 10.2 cm. Thinning and increased echogenicity of cortex. No mass, hydronephrosis or shadowing calcification.  Left Kidney:  Length: 10.3 cm. Thinning and increased echogenicity of cortex. No mass, hydronephrosis or shadowing calcification.  Bladder:  Decompressed, patient voided prior to exam, unable to assess.  IMPRESSION: BILATERAL renal cortical atrophy and medical renal disease changes.  No acute abnormalities.   Electronically Signed   By: Ulyses Southward M.D.   On: 08/04/2015 16:37    Microbiology: No results found for this or any previous visit (from the  past 240 hour(s)).   Labs: Basic Metabolic Panel:  Recent Labs Lab 08/04/15 0615 08/05/15 0518 08/06/15 0431 08/07/15 0510 08/08/15 0514  NA 134* 134* 136 137 138  K 4.6 4.1 3.8 3.4* 4.1  CL 99* 99* 103 100* 102  CO2 26 26 27 29 28   GLUCOSE 125* 100* 90 98 155*  BUN 52* 57* 51* 39* 30*  CREATININE 2.07* 2.42* 2.04* 1.50* 1.34*  CALCIUM 9.2 8.8* 8.9 8.8* 9.5  MG 2.1  --   --   --   --    Liver Function Tests: No results for input(s): AST, ALT, ALKPHOS, BILITOT, PROT, ALBUMIN in the last 168 hours. No results for input(s): LIPASE, AMYLASE in the last 168 hours. No results for input(s): AMMONIA in the last 168 hours. CBC:  Recent Labs Lab 08/03/15 1736 08/07/15 0510  WBC 8.0 6.5  HGB 9.3*  9.0*  HCT 28.2* 27.8*  MCV 90.7 90.0  PLT 150 159   Cardiac Enzymes:  Recent Labs Lab 08/03/15 2101 08/04/15 0133 08/04/15 0615 08/04/15 1218  TROPONINI 0.13* 0.11* 0.09* 0.08*   BNP: BNP (last 3 results)  Recent Labs  08/03/15 1845  BNP 626.1*    ProBNP (last 3 results)  Recent Labs  11/06/14 1255  PROBNP 2232.0*    CBG: No results for input(s): GLUCAP in the last 168 hours.     Signed:  Kerah Hardebeck A  [facility] 08/08/2015, 11:01 AM

## 2015-08-08 NOTE — Progress Notes (Signed)
Talked to patient again about HHC, she is now agreeable. HHC choice offered, patient chose [facility],  [facility] [address] [place], [place] [address] Phone: [phone]   [facility] agency called, orders faxed as requested. Abelino Derrick [facility] [phone]

## 2015-08-09 NOTE — Progress Notes (Signed)
Patient discharged home yesterday. [facility] cannot accept patient. VM left with patient concerning a change in her HHC services, [facility] was her second choice for home care services yesterday. Orders/ clinical information faxed as requested. Abelino Derrick Rn,MHA,BSN [phone]

## 2015-08-10 ENCOUNTER — Ambulatory Visit: Payer: Self-pay | Admitting: Physician Assistant

## 2015-08-15 ENCOUNTER — Telehealth (HOSPITAL_COMMUNITY): Payer: Self-pay | Admitting: Surgery

## 2015-08-15 NOTE — Telephone Encounter (Signed)
Heart Failure Nurse Navigator Post Discharge Telephone Call  I called to check on Anita Wiley after her recent hospitalization.  She tells me that things have been going well.  She did have some issues with "running out of medications"--however says that she visited her physician this week and it was all "straightened out".  She has been weighing daily and weight today was 193.8lbs versus her discharge weight of 190.8lbs on 08/08/15.  She has a home health nurse to visit later today and I asked her to keep a close eye on her weight- and to call physician if it increases again.  She denies any shortness of breath.  I encouraged her to call me back with any concerns or questions related to her HF.

## 2015-09-11 ENCOUNTER — Emergency Department (HOSPITAL_COMMUNITY): Payer: Medicare Other

## 2015-09-11 ENCOUNTER — Encounter (HOSPITAL_COMMUNITY): Payer: Self-pay | Admitting: *Deleted

## 2015-09-11 ENCOUNTER — Inpatient Hospital Stay (HOSPITAL_COMMUNITY)
Admission: EM | Admit: 2015-09-11 | Discharge: 2015-09-22 | DRG: 287 | Disposition: A | Discharge status: Home Health | Payer: Medicare Other | Attending: Internal Medicine | Admitting: Internal Medicine

## 2015-09-11 DIAGNOSIS — I509 Heart failure, unspecified: Secondary | ICD-10-CM | POA: Diagnosis not present

## 2015-09-11 DIAGNOSIS — R0602 Shortness of breath: Secondary | ICD-10-CM | POA: Insufficient documentation

## 2015-09-11 DIAGNOSIS — I10 Essential (primary) hypertension: Secondary | ICD-10-CM | POA: Diagnosis present

## 2015-09-11 DIAGNOSIS — Z950 Presence of cardiac pacemaker: Secondary | ICD-10-CM | POA: Diagnosis not present

## 2015-09-11 DIAGNOSIS — I5033 Acute on chronic diastolic (congestive) heart failure: Principal | ICD-10-CM | POA: Diagnosis present

## 2015-09-11 DIAGNOSIS — M109 Gout, unspecified: Secondary | ICD-10-CM | POA: Diagnosis present

## 2015-09-11 DIAGNOSIS — E876 Hypokalemia: Secondary | ICD-10-CM | POA: Diagnosis not present

## 2015-09-11 DIAGNOSIS — Z6833 Body mass index (BMI) 33.0-33.9, adult: Secondary | ICD-10-CM

## 2015-09-11 DIAGNOSIS — I129 Hypertensive chronic kidney disease with stage 1 through stage 4 chronic kidney disease, or unspecified chronic kidney disease: Secondary | ICD-10-CM | POA: Diagnosis present

## 2015-09-11 DIAGNOSIS — R0600 Dyspnea, unspecified: Secondary | ICD-10-CM

## 2015-09-11 DIAGNOSIS — N184 Chronic kidney disease, stage 4 (severe): Secondary | ICD-10-CM | POA: Diagnosis not present

## 2015-09-11 DIAGNOSIS — D649 Anemia, unspecified: Secondary | ICD-10-CM | POA: Diagnosis present

## 2015-09-11 DIAGNOSIS — K5900 Constipation, unspecified: Secondary | ICD-10-CM | POA: Diagnosis present

## 2015-09-11 DIAGNOSIS — Z8249 Family history of ischemic heart disease and other diseases of the circulatory system: Secondary | ICD-10-CM | POA: Diagnosis not present

## 2015-09-11 DIAGNOSIS — N179 Acute kidney failure, unspecified: Secondary | ICD-10-CM | POA: Diagnosis present

## 2015-09-11 DIAGNOSIS — I130 Hypertensive heart and chronic kidney disease with heart failure and stage 1 through stage 4 chronic kidney disease, or unspecified chronic kidney disease: Secondary | ICD-10-CM | POA: Diagnosis not present

## 2015-09-11 DIAGNOSIS — I272 Other secondary pulmonary hypertension: Secondary | ICD-10-CM | POA: Diagnosis present

## 2015-09-11 DIAGNOSIS — E669 Obesity, unspecified: Secondary | ICD-10-CM | POA: Diagnosis present

## 2015-09-11 DIAGNOSIS — Z885 Allergy status to narcotic agent status: Secondary | ICD-10-CM

## 2015-09-11 DIAGNOSIS — Z7982 Long term (current) use of aspirin: Secondary | ICD-10-CM

## 2015-09-11 DIAGNOSIS — I5023 Acute on chronic systolic (congestive) heart failure: Secondary | ICD-10-CM | POA: Diagnosis not present

## 2015-09-11 DIAGNOSIS — I2510 Atherosclerotic heart disease of native coronary artery without angina pectoris: Secondary | ICD-10-CM | POA: Diagnosis present

## 2015-09-11 DIAGNOSIS — N183 Chronic kidney disease, stage 3 unspecified: Secondary | ICD-10-CM | POA: Diagnosis present

## 2015-09-11 DIAGNOSIS — R600 Localized edema: Secondary | ICD-10-CM

## 2015-09-11 DIAGNOSIS — R609 Edema, unspecified: Secondary | ICD-10-CM

## 2015-09-11 LAB — BASIC METABOLIC PANEL
ANION GAP: 10 (ref 5–15)
BUN: 45 mg/dL — AB (ref 6–20)
CO2: 26 mmol/L (ref 22–32)
Calcium: 9.1 mg/dL (ref 8.9–10.3)
Chloride: 99 mmol/L — ABNORMAL LOW (ref 101–111)
Creatinine, Ser: 2.13 mg/dL — ABNORMAL HIGH (ref 0.44–1.00)
GFR, EST AFRICAN AMERICAN: 25 mL/min — AB (ref >60)
GFR, EST NON AFRICAN AMERICAN: 21 mL/min — AB (ref >60)
Glucose, Bld: 94 mg/dL (ref 65–99)
POTASSIUM: 3.6 mmol/L (ref 3.5–5.1)
SODIUM: 135 mmol/L (ref 135–145)

## 2015-09-11 LAB — CBC
HCT: 26.8 % — ABNORMAL LOW (ref 36.0–46.0)
HEMATOCRIT: 27.9 % — AB (ref 36.0–46.0)
HEMOGLOBIN: 8.9 g/dL — AB (ref 12.0–15.0)
Hemoglobin: 9 g/dL — ABNORMAL LOW (ref 12.0–15.0)
MCH: 28.5 pg (ref 26.0–34.0)
MCH: 29.3 pg (ref 26.0–34.0)
MCHC: 32.3 g/dL (ref 30.0–36.0)
MCHC: 33.2 g/dL (ref 30.0–36.0)
MCV: 88.3 fL (ref 78.0–100.0)
MCV: 88.2 fL (ref 78.0–100.0)
PLATELETS: 157 10*3/uL (ref 150–400)
Platelets: 149 10*3/uL — ABNORMAL LOW (ref 150–400)
RBC: 3.16 MIL/uL — AB (ref 3.87–5.11)
RBC: 3.04 MIL/uL — AB (ref 3.87–5.11)
RDW: 15 % (ref 11.5–15.5)
RDW: 14.9 % (ref 11.5–15.5)
WBC: 5.9 10*3/uL (ref 4.0–10.5)
WBC: 6.6 10*3/uL (ref 4.0–10.5)

## 2015-09-11 LAB — I-STAT TROPONIN, ED: TROPONIN I, POC: 0.03 ng/mL (ref 0.00–0.08)

## 2015-09-11 LAB — BRAIN NATRIURETIC PEPTIDE: B NATRIURETIC PEPTIDE 5: 1228.8 pg/mL — AB (ref 0.0–100.0)

## 2015-09-11 MED ORDER — ASPIRIN 81 MG PO TABS: 81.0000 mg | ORAL_TABLET | Freq: Every day | ORAL | Status: DC

## 2015-09-11 MED ORDER — ALLOPURINOL 100 MG PO TABS: 100.0000 mg | ORAL_TABLET | Freq: Every day | ORAL | Status: DC

## 2015-09-11 MED ORDER — ENOXAPARIN SODIUM 30 MG/0.3ML ~~LOC~~ SOLN
30.0000 mg | SUBCUTANEOUS | Status: DC
  Administered 2015-09-11 – 2015-09-21 (×11): 30 mg via SUBCUTANEOUS
  Filled 2015-09-11 (×11): qty 0.3

## 2015-09-11 MED ORDER — SODIUM CHLORIDE 0.9 % IJ SOLN
3.0000 mL | Freq: Two times a day (BID) | INTRAMUSCULAR | Status: DC
  Administered 2015-09-11 – 2015-09-19 (×15): 3 mL via INTRAVENOUS

## 2015-09-11 MED ORDER — CARVEDILOL 12.5 MG PO TABS
12.5000 mg | ORAL_TABLET | Freq: Two times a day (BID) | ORAL | Status: DC
  Administered 2015-09-12 – 2015-09-22 (×21): 12.5 mg via ORAL
  Filled 2015-09-11 (×19): qty 1
  Filled 2015-09-11: qty 2
  Filled 2015-09-11 (×2): qty 1

## 2015-09-11 MED ORDER — FERROUS SULFATE 325 (65 FE) MG PO TABS
325.0000 mg | ORAL_TABLET | Freq: Every day | ORAL | Status: DC
  Administered 2015-09-12 – 2015-09-22 (×11): 325 mg via ORAL
  Filled 2015-09-11 (×11): qty 1

## 2015-09-11 MED ORDER — VERAPAMIL HCL 180 MG (CO) PO TB24: 180.0000 mg | ORAL_TABLET | Freq: Every day | ORAL | Status: DC

## 2015-09-11 MED ORDER — SENNOSIDES-DOCUSATE SODIUM 8.6-50 MG PO TABS: 1.0000 | ORAL_TABLET | Freq: Every evening | ORAL | Status: DC | PRN

## 2015-09-11 MED ORDER — SODIUM CHLORIDE 0.9 % IJ SOLN
3.0000 mL | INTRAMUSCULAR | Status: DC | PRN
  Administered 2015-09-17: 3 mL via INTRAVENOUS
  Filled 2015-09-11: qty 3

## 2015-09-11 MED ORDER — FUROSEMIDE 10 MG/ML IJ SOLN
40.0000 mg | Freq: Once | INTRAMUSCULAR | Status: AC
  Administered 2015-09-11: 40 mg via INTRAVENOUS
  Filled 2015-09-11: qty 4

## 2015-09-11 MED ORDER — ACETAMINOPHEN 325 MG PO TABS
650.0000 mg | ORAL_TABLET | ORAL | Status: DC | PRN
  Filled 2015-09-11: qty 2

## 2015-09-11 MED ORDER — VERAPAMIL HCL ER 180 MG PO TBCR
180.0000 mg | EXTENDED_RELEASE_TABLET | Freq: Every day | ORAL | Status: DC
  Administered 2015-09-12 – 2015-09-22 (×11): 180 mg via ORAL
  Filled 2015-09-11 (×12): qty 1

## 2015-09-11 MED ORDER — POTASSIUM CHLORIDE ER 10 MEQ PO TBCR
20.0000 meq | EXTENDED_RELEASE_TABLET | Freq: Every day | ORAL | Status: DC
  Administered 2015-09-12 – 2015-09-14 (×3): 20 meq via ORAL
  Filled 2015-09-11 (×9): qty 2

## 2015-09-11 MED ORDER — ONDANSETRON HCL 4 MG/2ML IJ SOLN: 4.0000 mg | Freq: Four times a day (QID) | INTRAMUSCULAR | Status: DC | PRN

## 2015-09-11 MED ORDER — ISOSORB DINITRATE-HYDRALAZINE 20-37.5 MG PO TABS
1.0000 | ORAL_TABLET | Freq: Three times a day (TID) | ORAL | Status: DC
  Administered 2015-09-11 – 2015-09-19 (×22): 1 via ORAL
  Filled 2015-09-11 (×23): qty 1

## 2015-09-11 MED ORDER — POTASSIUM CHLORIDE ER 10 MEQ PO TBCR: 10.0000 meq | EXTENDED_RELEASE_TABLET | Freq: Every day | ORAL | Status: DC

## 2015-09-11 MED ORDER — FUROSEMIDE 10 MG/ML IJ SOLN
40.0000 mg | Freq: Two times a day (BID) | INTRAMUSCULAR | Status: DC
  Administered 2015-09-12 – 2015-09-13 (×3): 40 mg via INTRAVENOUS
  Filled 2015-09-11 (×3): qty 4

## 2015-09-11 MED ORDER — ASPIRIN EC 81 MG PO TBEC
81.0000 mg | DELAYED_RELEASE_TABLET | Freq: Every day | ORAL | Status: DC
  Administered 2015-09-12 – 2015-09-22 (×11): 81 mg via ORAL
  Filled 2015-09-11 (×12): qty 1

## 2015-09-11 MED ORDER — ALLOPURINOL 100 MG PO TABS
100.0000 mg | ORAL_TABLET | Freq: Every day | ORAL | Status: DC
  Administered 2015-09-12 – 2015-09-22 (×11): 100 mg via ORAL
  Filled 2015-09-11 (×12): qty 1

## 2015-09-11 MED ORDER — SODIUM CHLORIDE 0.9 % IV SOLN: 250.0000 mL | INTRAVENOUS | Status: DC | PRN

## 2015-09-11 NOTE — ED Notes (Signed)
Attempted report 

## 2015-09-11 NOTE — ED Notes (Signed)
Pt back from imaging, PA at the bedside.

## 2015-09-11 NOTE — ED Notes (Signed)
Admitting at bedside 

## 2015-09-11 NOTE — ED Provider Notes (Signed)
CSN: [id number]     Arrival date & time 09/11/15  1708 History   First MD Initiated Contact with Patient 09/11/15 1751     Chief Complaint  Patient presents with  . Shortness of Breath     (Consider location/radiation/quality/duration/timing/severity/associated sxs/prior Treatment) HPI Comments: Anita Wiley is a 74 y.o. female with a PMHx of CHF, HTN, bradycardia s/p pacemaker insertion, CKD, gout, and arthritis, who presents to the ED with complaints of gradually worsening shortness breath 2 weeks. She reports that she isn't taking her Lasix 40 mg daily but this is not improving her symptoms. She endorses dyspnea on exertion, wheezing, and weight gain of 14 pounds since Friday 4 days ago, stating that her normal weight is 195 and she was last weight at 208.8 this morning. Additionally she reports bilateral lower extremity edema which is worsening, and left breast swelling 2 days with no erythema/warmth/drainage or pain. She was told by Dr. Tasia Catchings her primary care doctor to come to the ER to "draw fluid off".  She denies any orthopnea, fevers, chills, chest pain, cough, hemoptysis, recent travel/surgery/immobilization, history of DVT/PE, abdominal pain, nausea, vomiting, diarrhea, constipation, dysuria, hematuria, numbness, tingling, or weakness.  Patient is a 74 y.o. female presenting with shortness of breath. The history is provided by the patient and medical records. No language interpreter was used.  Shortness of Breath Severity:  Moderate Onset quality:  Gradual Duration:  2 weeks Timing:  Constant Progression:  Worsening Chronicity:  Recurrent Context: activity   Relieved by:  Nothing Worsened by:  Activity and exertion Ineffective treatments:  Diuretics Associated symptoms: wheezing   Associated symptoms: no abdominal pain, no chest pain, no cough, no fever, no hemoptysis, no PND, no sputum production and no vomiting   Risk factors: no family hx of DVT, no hx of PE/DVT, no  prolonged immobilization, no recent surgery and no tobacco use     Past Medical History  Diagnosis Date  . Hypertension   . Bradycardia     s/p pacemaker in 2010  . Chronic kidney disease   . Gout   . Pacemaker 2010  . Arthritis     OSTEO  . Gout    Past Surgical History  Procedure Laterality Date  . Pacemaker insertion      2010  . Knee surgery    . Abdominal hysterectomy      at age 16  . Insert / replace / remove pacemaker  2010  . Left heart catheterization with coronary angiogram N/A 11/06/2014    Procedure: LEFT HEART CATHETERIZATION WITH CORONARY ANGIOGRAM;  Surgeon: Lennette Bihari, MD;  Location: [facility] CATH LAB;  Service: Cardiovascular;  Laterality: N/A;   Family History  Problem Relation Age of Onset  . Hypertension Mother   . CAD Sister     unkown age   Social History  Substance Use Topics  . Smoking status: Never Smoker   . Smokeless tobacco: Never Used  . Alcohol Use: No   OB History    No data available     Review of Systems  Constitutional: Positive for unexpected weight change (14lbs in 4 days). Negative for fever and chills.  Respiratory: Positive for shortness of breath and wheezing. Negative for cough, hemoptysis and sputum production.        +DOE  Cardiovascular: Positive for leg swelling. Negative for chest pain and PND.  Gastrointestinal: Negative for nausea, vomiting, abdominal pain, diarrhea and constipation.  Genitourinary: Negative for dysuria and hematuria.  Musculoskeletal: Negative for  myalgias and arthralgias.  Skin: Negative for color change.  Allergic/Immunologic: Negative for immunocompromised state.  Neurological: Negative for weakness and numbness.  Psychiatric/Behavioral: Negative for confusion.   10 Systems reviewed and are negative for acute change except as noted in the HPI.    Allergies  Codeine  Home Medications   Prior to Admission medications   Medication Sig Start Date End Date Taking? Authorizing Provider   allopurinol (ZYLOPRIM) 100 MG tablet Take 100 mg by mouth daily.    Historical Provider, MD  aspirin 81 MG tablet Take 81 mg by mouth daily.    Historical Provider, MD  azithromycin (ZITHROMAX) 500 MG tablet Take 1 tablet (500 mg total) by mouth daily. 08/08/15   Clydia Llano, MD  carvedilol (COREG) 12.5 MG tablet Take 1 tablet (12.5 mg total) by mouth 2 (two) times daily with a meal. 08/08/15   Clydia Llano, MD  cefUROXime (CEFTIN) 500 MG tablet Take 1 tablet (500 mg total) by mouth 2 (two) times daily with a meal. 08/08/15   Clydia Llano, MD  furosemide (LASIX) 40 MG tablet Take 40 mg by mouth daily.    Historical Provider, MD  potassium chloride (K-DUR) 10 MEQ tablet Take 10 mEq by mouth daily.    Historical Provider, MD  verapamil (COVERA HS) 180 MG (CO) 24 hr tablet Take 180 mg by mouth at bedtime.    Historical Provider, MD   BP 135/69 mmHg  Pulse 72  Temp(Src) 98.1 F (36.7 C) (Oral)  Resp 24  SpO2 99% Physical Exam  Constitutional: She is oriented to person, place, and time. Vital signs are normal. She appears well-developed and well-nourished.  Non-toxic appearance. No distress.  Afebrile, nontoxic, NAD  HENT:  Head: Normocephalic and atraumatic.  Mouth/Throat: Oropharynx is clear and moist and mucous membranes are normal.  Eyes: Conjunctivae and EOM are normal. Right eye exhibits no discharge. Left eye exhibits no discharge.  Neck: Normal range of motion. Neck supple.  Cardiovascular: Normal rate, regular rhythm, normal heart sounds and intact distal pulses.  Exam reveals no gallop and no friction rub.   No murmur heard. RRR, nl s1/s2, no m/r/g, distal pulses intact, 4+ BLE pitting edema  Pulmonary/Chest: Effort normal. No respiratory distress. She has no decreased breath sounds. She has no wheezes. She has no rhonchi. She has rales. Left breast exhibits no inverted nipple, no nipple discharge, no skin change and no tenderness. Breasts are asymmetrical.  Poor inspiratory effort on  exam, faint bibasilar rales, no wheezes or rhonchi. No hypoxia or increased WOB, speaking in somewhat partial sentences stopping to catch her breath intermittently, SpO2 99% on RA  L breast with 1+ pitting edema and approximately double the size of the R breast, no nipple inversion or discharge, no skin changes, no tenderness  Abdominal: Soft. Normal appearance and bowel sounds are normal. She exhibits no distension. There is no tenderness. There is no rigidity, no rebound, no guarding, no CVA tenderness, no tenderness at McBurney's point and negative Murphy's sign.  Musculoskeletal: Normal range of motion.  MAE x4 Strength and sensation grossly intact Distal pulses intact 4+ BLE pitting edema, no sacral edema noted but pt is obese which limits exam  Neurological: She is alert and oriented to person, place, and time. She has normal strength. No sensory deficit.  Skin: Skin is warm, dry and intact. No rash noted.  Psychiatric: She has a normal mood and affect.  Nursing note and vitals reviewed.   ED Course  Procedures (including critical  care time) Labs Review Labs Reviewed  BASIC METABOLIC PANEL - Abnormal; Notable for the following:    Chloride 99 (*)    BUN 45 (*)    Creatinine, Ser 2.13 (*)    GFR calc non Af Amer 21 (*)    GFR calc Af Amer 25 (*)    All other components within normal limits  CBC - Abnormal; Notable for the following:    RBC 3.16 (*)    Hemoglobin 9.0 (*)    HCT 27.9 (*)    All other components within normal limits  BRAIN NATRIURETIC PEPTIDE - Abnormal; Notable for the following:    B Natriuretic Peptide 1228.8 (*)    All other components within normal limits  I-STAT TROPOININ, ED    Imaging Review Dg Chest 2 View  09/11/2015   CLINICAL DATA:  Worsening dyspnea.  EXAM: CHEST  2 VIEW  COMPARISON:  08/04/2015  FINDINGS: There is marked cardiomegaly, unchanged. There are intact appearances of the transvenous cardiac leads. Mild central vascular prominence is  present, without frank alveolar or interstitial edema. There is no pleural effusion. The lungs are clear.  Incidental note is made of severe bilateral shoulder arthropathy, likely due to chronic rotator cuff tears.  IMPRESSION: Marked cardiomegaly. Moderate chronic-appearing central vascular prominence.   Electronically Signed   By: Ellery Plunk M.D.   On: 09/11/2015 18:19     08/05/15 Echo: Study Conclusions - Left ventricle: The cavity size was normal. There was moderate concentric hypertrophy. Systolic function was normal. The estimated ejection fraction was in the range of 55% to 60%. Wall motion was normal; there were no regional wall motion abnormalities. There was a reduced contribution of atrial contraction to ventricular filling, due to increased ventricular diastolic pressure or atrial contractile dysfunction. Doppler parameters are consistent with a reversible restrictive pattern, indicative of decreased left ventricular diastolic compliance and/or increased left atrial pressure (grade 3 diastolic dysfunction). Doppler parameters are consistent with high ventricular filling pressure. - Aortic valve: Trileaflet; mildly thickened leaflets. There was trivial regurgitation. - Mitral valve: There was mild to moderate regurgitation. - Left atrium: The atrium was moderately dilated. - Tricuspid valve: There was moderate-severe regurgitation. - Pulmonary arteries: PA peak pressure: 66 mm Hg (S). Impressions: - The right ventricular systolic pressure was increased consistent with moderate pulmonary hypertension.  08/04/15 V/Q scan:Study Result     CLINICAL DATA: 74 year old female with acute respiratory distress since yesterday evening.  EXAM: NUCLEAR MEDICINE VENTILATION AND PERFUSION SCAN  TECHNIQUE: Perfusion images were obtained in multiple projections after intravenous injection of radiopharmaceutical. Sequential dynamic ventilation images  were obtained in standard planar projections following inhalation of radiopharmaceutical.  RADIOPHARMACEUTICALS: 3.28 mCi Tc59m MAA IV (ventilation scan not performed secondary to patient's inability to follow breathing instructions)  COMPARISON: No priors.  FINDINGS: Ventilation scan not performed secondary to patient's inability to adequately follow breathing instructions due to severe shortness of breath. Perfusion scan demonstrates no perfusion deficits.  IMPRESSION: Normal perfusion study. No evidence of pulmonary embolism.   Electronically Signed  By: Trudie Reed M.D.  On: 08/04/2015 14:56     I have personally reviewed and evaluated these images and lab results as part of my medical decision-making.   EKG Interpretation   Date/Time:  Tuesday September 11 2015 17:20:01 EDT Ventricular Rate:  73 PR Interval:    QRS Duration: 148 QT Interval:  438 QTC Calculation: 482 R Axis:   -65 Text Interpretation:  Ventricular-paced rhythm Abnormal ECG since last  tracing no  significant change Confirmed by [facility]  MD, ELLIOTT [phone]) on  09/11/2015 6:48:39 PM      MDM   Final diagnoses:  Acute on chronic congestive heart failure, unspecified congestive heart failure type (HCC)  Peripheral edema  SOB (shortness of breath)    74 y.o. female here with worsening SOB, worsening BLE swelling, and 2 days of L breast swelling. Takes lasix at home but this isn't helping. Was told by her PCP to come to ER to "have fluid taken off". On exam, 4+ BLE pitting edema, and L breast with 1+ pitting edema. No sacral edema noted but pt is obese which limits exam somewhat. CBC returning with baseline anemia. Trop neg. Awaiting BMP and BNP. EKG unchanged. CXR pending. Will give lasix and likely admit for CHF exacerbation.   7:04 PM BMP with Cr 2.13, slightly elevated from baseline, and BUN 45. BNP 1228.8. CXR  With marked cardiomegaly. Will proceed with admission for CHF.  7:37  PM Dr. Tasia Catchings of IM residency returning page, will admit. Please see his notes for further documentation of care.  BP 117/63 mmHg  Pulse 73  Temp(Src) 98.1 F (36.7 C) (Oral)  Resp 24  Wt 207 lb 3.2 oz (93.985 kg)  SpO2 100%  Meds ordered this encounter  Medications  . furosemide (LASIX) injection 40 mg    Sig:   . isosorbide-hydrALAZINE (BIDIL) 20-37.5 MG tablet    Sig: Take 1 tablet by mouth 3 (three) times daily.  . ferrous sulfate 325 (65 FE) MG tablet    Sig: Take 325 mg by mouth daily with breakfast.     Schae Cando Camprubi-Soms, PA-C 09/11/15 1937  Mancel Bale, MD 09/11/15 2319

## 2015-09-11 NOTE — ED Provider Notes (Signed)
  Face-to-face evaluation   History: She presents for evaluation of weight gain and shortness of breath. She is taking her usual medications, without relief.  Physical exam: Alert. No respiratory distress. 4+ peripheral edema.  Medical screening examination/treatment/procedure(s) were conducted as a shared visit with non-physician practitioner(s) and myself.  I personally evaluated the patient during the encounter  Mancel Bale, MD 09/11/15 [phone]

## 2015-09-11 NOTE — H&P (Signed)
Date: 09/11/2015               Patient Name:  Anita Wiley [name]: [id number]  DOB: 1941-07-25 Age / Sex: 74 y.o., female   PCP: Angela Adam, MD         Medical Service: Internal Medicine Teaching Service         Attending Physician: Dr. [name], MD    First Contact: Dr. Valentino Nose Pager: [phone]  Second Contact: Dr. 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 She was hospitalized one month ago for a CHF exacerbation and was discharged home with  Lasix daily; her ejection fraction at that time was 40-45%. She's been taking her Lasix as prescribed, he drinks about 40oz of water per day, and she has not changed her diet recently. She had a heart cath in November 2015 that was clean. She denies any chest pain, fevers, chills, abdominal pain, dysuria, or other complaints.  In the emergency department, her vital signs were normal, a BNP was elevated from 600 last admission to 1200 today, her creatinine was elevated from her baseline of 1.3 to 2.1, and chest x-ray showed pulmonary vascular congestion. Her EKG was unchanged from prior and a point-of-care  Meds: No current facility-administered medications for this encounter.   Current Outpatient Prescriptions  Medication Sig Dispense Refill  . allopurinol (ZYLOPRIM) 100 MG tablet Take 100 mg by mouth daily.    Marland Kitchen aspirin 81 MG tablet Take 81 mg by  mouth daily.    . carvedilol (COREG) 12.5 MG tablet Take 1 tablet (12.5 mg total) by mouth 2 (two) times daily with a meal. 60 tablet 0  . ferrous sulfate 325 (65 FE) MG tablet Take 325 mg by mouth daily with breakfast.    . furosemide (LASIX) 40 MG tablet Take 40 mg by mouth daily.    . isosorbide-hydrALAZINE (BIDIL) 20-37.5 MG tablet Take 1 tablet by mouth 3 (three) times daily.    . potassium chloride (K-DUR) 10 MEQ tablet Take 10 mEq by mouth daily.    . verapamil (COVERA HS) 180 MG (CO) 24 hr tablet Take 180 mg by mouth daily.     Marland Kitchen azithromycin (ZITHROMAX) 500 MG tablet Take 1 tablet (500 mg total) by mouth daily. (Patient not taking: Reported on 09/11/2015) 5 tablet 0  . cefUROXime (CEFTIN) 500 MG tablet Take 1 tablet (500 mg total) by mouth 2 (two) times daily with a meal. (Patient not taking: Reported on 09/11/2015) 10 tablet 0    Allergies: Allergies as of 09/11/2015 - Review Complete 09/11/2015  Allergen Reaction Noted  . Codeine Itching 11/06/2014   Past Medical History  Diagnosis Date  . Hypertension   . Bradycardia     s/p pacemaker in 2010  . Chronic kidney disease   . Gout   . Pacemaker 2010  . Arthritis     OSTEO  . Gout  Past Surgical History  Procedure Laterality Date  . Pacemaker insertion      2010  . Knee surgery    . Abdominal hysterectomy      at age 52  . Insert / replace / remove pacemaker  2010  . Left heart catheterization with coronary angiogram N/A 11/06/2014    Procedure: LEFT HEART CATHETERIZATION WITH CORONARY ANGIOGRAM;  Surgeon: Lennette Bihari, MD;  Location: [facility] CATH LAB;  Service: Cardiovascular;  Laterality: N/A;   Family History  Problem Relation Age of Onset  . Hypertension Mother   . CAD Sister     unkown age   Social History   Social History  . Marital Status: Widowed    Spouse Name: N/A  . Number of Children: N/A  . Years of Education: N/A   Occupational History  . Not on file.   Social History Main Topics  . Smoking  status: Never Smoker   . Smokeless tobacco: Never Used  . Alcohol Use: No  . Drug Use: No  . Sexual Activity: Not on file   Other Topics Concern  . Not on file   Social History Narrative    Review of Systems  Constitutional: Negative for fever, chills, weight loss, malaise/fatigue and diaphoresis.  HENT: Positive for hearing loss.   Eyes: Negative for blurred vision and double vision.  Respiratory: Positive for shortness of breath. Negative for cough, hemoptysis and wheezing.   Cardiovascular: Positive for orthopnea and leg swelling. Negative for chest pain and palpitations.  Gastrointestinal: Positive for constipation. Negative for heartburn, nausea, vomiting, abdominal pain and diarrhea.  Genitourinary: Negative for dysuria, urgency and frequency.  Musculoskeletal: Negative for myalgias.  Skin: Negative for rash.  Neurological: Negative for dizziness, loss of consciousness and headaches.    Physical Exam: Blood pressure 117/63, pulse 73, temperature 98.1 F (36.7 C), temperature source Oral, resp. rate 24, weight 93.985 kg (207 lb 3.2 oz), SpO2 100 %.   Physical Exam  Constitutional: She is oriented to person, place, and time. She appears well-developed and well-nourished. No distress.  HENT:  Head: Normocephalic and atraumatic.  Eyes: Conjunctivae and EOM are normal.  Neck: Normal range of motion. Neck supple. JVD present.  Cardiovascular: Normal rate and regular rhythm.  Exam reveals gallop and S3.   Murmur heard.  Systolic murmur is present   No diastolic murmur is present  Pulmonary/Chest: Effort normal. No respiratory distress. She has wheezes in the right lower field and the left lower field. She has rales in the right lower field and the left lower field.  Abdominal: Soft. Bowel sounds are normal. There is no tenderness.  Neurological: She is alert and oriented to person, place, and time.  Skin: Skin is warm and dry. No erythema. No pallor.  Psychiatric: She has a  normal mood and affect. Her behavior is normal.    Lab results: Basic Metabolic Panel:  Recent Labs  16/10/96 1726  NA 135  K 3.6  CL 99*  CO2 26  GLUCOSE 94  BUN 45*  CREATININE 2.13*  CALCIUM 9.1   CBC:  Recent Labs  09/11/15 1726  WBC 5.9  HGB 9.0*  HCT 27.9*  MCV 88.3  PLT 157   Imaging results:  Dg Chest 2 View  09/11/2015   CLINICAL DATA:  Worsening dyspnea.  EXAM: CHEST  2 VIEW  COMPARISON:  08/04/2015  FINDINGS: There is marked cardiomegaly, unchanged. There are intact appearances of the transvenous cardiac leads. Mild central vascular prominence is present,  without frank alveolar or interstitial edema. There is no pleural effusion. The lungs are clear.  Incidental note is made of severe bilateral shoulder arthropathy, likely due to chronic rotator cuff tears.  IMPRESSION: Marked cardiomegaly. Moderate chronic-appearing central vascular prominence.   Electronically Signed   By: Ellery Plunk M.D.   On: 09/11/2015 18:19    Other results: EKG: Ventricular-paced rhythm, no ischemic changes, unchanged from last EKG one month ago.  Assessment & Plan by Problem: Mrs. Fariss is a pleasant 74 year old lady presenting with classic symptoms of congestive heart failure exacerbation, including orthopnea, weight gain, and lower leg swelling. I suspect her home dose of furosemide  daily isn't quite enough; I do not believe she has a newly incompetent valve because she just had a TTE last month and her symptoms have been slowly progressing. She was started on hydralazine and isosorbide mononitrate two weeks ago and has been tolerating this well, but the afterload reduction doesn't seem to be working well enough. We will diurese her with  IV and see how her creatinine tolerates this.  Congestive heart failure exacerbation: Per above. -  IV lasix twice daily -  PO potassium daily -Daily BMPs -Daily weights -Ins and outs  Chronic kidney disease: Baseline creatine  1.3, 2.1 on admission. -BMP tomorrow  Anemia: Stably low at 9. -Iron sulfate pills  Constipation: She says she's been constipating on her iron pills, so we will start Senakot. -Senakot  Dispo: Disposition is deferred at this time, awaiting improvement of current medical problems.   The patient does have a current PCP (Angela Adam, MD) and does need an [facility] hospital follow-up appointment after discharge.  The patient does have transportation limitations that hinder transportation to clinic appointments.  Signed: Selina Cooley, MD 09/11/2015, 8:49 PM

## 2015-09-11 NOTE — ED Notes (Signed)
Pt in X ray

## 2015-09-11 NOTE — ED Notes (Signed)
Pt in from home c/o increased shortness of breath, pt feels like she is retaining fluid, denies cough or fever, history of same, when asked about history of CHF pt family states she has a heart problem but they aren't sure about specifics, pt speaking in full sentences.

## 2015-09-12 ENCOUNTER — Encounter (HOSPITAL_COMMUNITY): Payer: Self-pay | Admitting: General Practice

## 2015-09-12 DIAGNOSIS — R609 Edema, unspecified: Secondary | ICD-10-CM | POA: Insufficient documentation

## 2015-09-12 DIAGNOSIS — K5900 Constipation, unspecified: Secondary | ICD-10-CM

## 2015-09-12 DIAGNOSIS — R600 Localized edema: Secondary | ICD-10-CM | POA: Insufficient documentation

## 2015-09-12 DIAGNOSIS — R0602 Shortness of breath: Secondary | ICD-10-CM | POA: Insufficient documentation

## 2015-09-12 DIAGNOSIS — I5023 Acute on chronic systolic (congestive) heart failure: Secondary | ICD-10-CM

## 2015-09-12 LAB — BASIC METABOLIC PANEL
ANION GAP: 10 (ref 5–15)
BUN: 44 mg/dL — AB (ref 6–20)
CHLORIDE: 100 mmol/L — AB (ref 101–111)
CO2: 26 mmol/L (ref 22–32)
Calcium: 9 mg/dL (ref 8.9–10.3)
Creatinine, Ser: 1.9 mg/dL — ABNORMAL HIGH (ref 0.44–1.00)
GFR calc Af Amer: 28 mL/min — ABNORMAL LOW (ref >60)
GFR calc non Af Amer: 25 mL/min — ABNORMAL LOW (ref >60)
GLUCOSE: 99 mg/dL (ref 65–99)
POTASSIUM: 4.4 mmol/L (ref 3.5–5.1)
SODIUM: 136 mmol/L (ref 135–145)

## 2015-09-12 LAB — CBC WITH DIFFERENTIAL/PLATELET
BASOS ABS: 0 10*3/uL (ref 0.0–0.1)
Basophils Relative: 0 %
Eosinophils Absolute: 0.3 10*3/uL (ref 0.0–0.7)
Eosinophils Relative: 5 %
HCT: 26.8 % — ABNORMAL LOW (ref 36.0–46.0)
HEMOGLOBIN: 8.7 g/dL — AB (ref 12.0–15.0)
LYMPHS ABS: 1.3 10*3/uL (ref 0.7–4.0)
LYMPHS PCT: 23 %
MCH: 28.2 pg (ref 26.0–34.0)
MCHC: 32.5 g/dL (ref 30.0–36.0)
MCV: 86.7 fL (ref 78.0–100.0)
Monocytes Absolute: 1.3 10*3/uL — ABNORMAL HIGH (ref 0.1–1.0)
Monocytes Relative: 24 %
NEUTROS PCT: 48 %
Neutro Abs: 2.6 10*3/uL (ref 1.7–7.7)
Platelets: 160 10*3/uL (ref 150–400)
RBC: 3.09 MIL/uL — AB (ref 3.87–5.11)
RDW: 14.8 % (ref 11.5–15.5)
WBC: 5.5 10*3/uL (ref 4.0–10.5)

## 2015-09-12 NOTE — Progress Notes (Signed)
Subjective: Anita Wiley reports feeling better this morning. Her SOB has improved with the diuresis. Denies any chest pain.   Objective: Vital signs in last 24 hours: Filed Vitals:   09/11/15 2130 09/12/15 0111 09/12/15 0349 09/12/15 0903  BP: 138/70 119/63 137/58 125/48  Pulse: 67 68 65 60  Temp: 97.4 F (36.3 C) 97.7 F (36.5 C) 98.2 F (36.8 C) 98.1 F (36.7 C)  TempSrc: Oral Oral Oral   Resp: Height:  (1.651 m)     Weight: 205 lb 14.4 oz (93.396 kg)  203 lb 14.4 oz (92.488 kg)   SpO2: 97% 98% 100% 100%   Weight change:   Intake/Output Summary (Last 24 hours) at 09/12/15 1035 Last data filed at 09/12/15 0639  Gross per 24 hour  Intake      0 ml  Output   1500 ml  Net  -1500 ml    GENERAL- alert, co-operative, not in any distress. HEENT- Atraumatic, normocephalic, oral mucosa appears moist, good. JVD to level of jaw.  CARDIAC- RRR, no murmurs, rubs or gallops. RESP- Moving equal volumes of air, no wheezes or mild bibasilar crackles. ABDOMEN- Soft, nontender, no guarding or rebound, bowel sounds present. NEURO-AAOx3 EXTREMITIES-  SKIN- Warm, dry, No rash or lesion. PSYCH- Normal mood and affect, appropriate thought content and speech.  Medications: I have reviewed the patient's current medications. Scheduled Meds: . allopurinol  100 mg Oral Daily  . aspirin EC  81 mg Oral Daily  . carvedilol  12.5 mg Oral BID WC  . enoxaparin (LOVENOX) injection  30 mg Subcutaneous Q24H  . ferrous sulfate  325 mg Oral Q breakfast  . furosemide  40 mg Intravenous BID  . isosorbide-hydrALAZINE  1 tablet Oral TID  . potassium chloride  20 mEq Oral Daily  . sodium chloride  3 mL Intravenous Q12H  . verapamil  180 mg Oral Daily   Continuous Infusions:  PRN Meds:.sodium chloride, acetaminophen, ondansetron (ZOFRAN) IV, senna-docusate, sodium chloride Assessment/Plan:  Diastolic CHF exacerbation: Patient with ongoing orthopnea, weight gain, LE edema over the past  week. Admitted for CHF exacerbation a month ago on Lasix 40 mg daily. ECHO with EF 55-60% with grade 3 diastolic dysfunction. Diuresed 1.5 L overnight. Weight down 4lbs. Dry weight appears to be around 192-195 lbs. Satting well on room air.  -  IV lasix twice daily - PO potassium daily -Daily BMPs -Daily weights -Ins and outs -PT/OT  Acute renal failure superimposed on stage 3 chronic kidney disease: Baseline creatine 1.3, 2.1 on admission. Likely cardiorenal. Will monitor with diuresis. -BMP daily  HTN (hypertension): BP stable. On coreg and bidil at home. Also on verapamil. -Coreg 12/5 mg bid -Bidil 20-37.5 mg tid -Verapamil 180 mg daily  Anemia: Hgb stable at 8.7. Baseline ~9. -Iron sulfate pills  Constipation: She says she's been constipating on her iron pills. -Senakot  CAD, nonocclusive, s/p pacemaker: On ASA  DVT PPx: Lovenox  Dispo: Disposition is deferred at this time, awaiting improvement of current medical problems.  Anticipated discharge in approximately 1-2 day(s).   The patient does have a current PCP (Angela Adam, MD) and does need an [facility] hospital follow-up appointment after discharge.  The patient does not have transportation limitations that hinder transportation to clinic appointments.  .Services Needed at time of discharge: Y = Yes, Blank = No PT:   OT:   RN:   Equipment:   Other:     LOS: 1 day  Valentino Nose, MD IMTS PGY-1 [phone] 09/12/2015, 10:35 AM

## 2015-09-12 NOTE — Progress Notes (Signed)
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 She was hospitalized one month ago for a CHF exacerbation and was discharged home with  Lasix daily; her ejection fraction at that time was 40-45%. She's been taking her Lasix as prescribed, he drinks about 40oz of water per day, and she has not changed her diet recently. She had a heart cath in November 2015 that was clean. She denies any chest pain, fevers, chills, abdominal pain, dysuria, or other complaints.   Past Medical History  Diagnosis Date  . Hypertension   . Bradycardia     s/p pacemaker in 2010  . Chronic kidney disease   . Gout   . Pacemaker 2010  . Arthritis     OSTEO  . Gout     Social History   Social History  . Marital Status: Widowed    Spouse Name: N/A  . Number of Children: N/A  . Years of Education: N/A   Social History Main Topics  . Smoking status: Never Smoker   . Smokeless tobacco: Never Used  . Alcohol Use: No  . Drug Use: No  . Sexual Activity: Not Asked   Other Topics Concern  . None   Social History Narrative    ECHO: Study Conclusions--08/05/15  - Left ventricle: The cavity size was normal. There was moderate concentric hypertrophy. Systolic function was normal. The estimated ejection fraction was in the range of 55% to 60%. Wall motion was normal; there were no regional wall motion abnormalities. There was a reduced contribution of atrial contraction to ventricular filling, due to increased ventricular diastolic pressure or atrial contractile dysfunction. Doppler parameters are consistent with  a reversible restrictive pattern, indicative of decreased left ventricular diastolic compliance and/or increased left atrial pressure (grade 3 diastolic dysfunction). Doppler parameters are consistent with high ventricular filling pressure. - Aortic valve: Trileaflet; mildly thickened leaflets. There was trivial regurgitation. - Mitral valve: There was mild to moderate regurgitation. - Left atrium: The atrium was moderately dilated. - Tricuspid valve: There was moderate-severe regurgitation. - Pulmonary arteries: PA peak pressure: 66 mm Hg (S).  Impressions:  - The right ventricular systolic pressure was increased consistent with moderate pulmonary hypertension.  BNP    Component Value Date/Time   BNP 1228.8* 09/11/2015 1726    ProBNP    Component Value Date/Time   PROBNP 2232.0* 11/06/2014 1255     Education Assessment and Provision:  Detailed education and instructions provided on heart failure disease management including the following:  Signs and symptoms of Heart Failure When to call the physician Importance of daily weights Low sodium diet Fluid restriction Medication management Anticipated future follow-up appointments  Patient education given on each of the above topics.  Patient acknowledges understanding and acceptance of all instructions.  I spoke briefly with Ms.Krisher regarding her HF--I saw her when she was last admitted 08/03/15.  She can teach back topics listed above.  She tells me that she gained 8 lbs in one weekend and it was "going up"--so she called her physician and was admitted.  She had noticed increased SOB and had not been able to sleep lying down.  I reinforced that each  of these were signs and symptoms of HF.  She says she has not "eaten salt for years" and tries to be careful with sodium.  She denies any issue getting or taking prescribed medications.  She says she sees a physician in [place] as an outpatient.  Unfortunately I cannot  see notes from encounters in the chart.  Education Materials:  "Living Better With Heart Failure" Booklet, Daily Weight Tracker Tool    High Risk Criteria for Readmission and/or Poor Patient Outcomes:   EF <30%- yes 55-60% with grade 3 dias dys.  2 or more admissions in 6 months- yes 2/6  Difficult social situation- No  Demonstrates medication noncompliance- no denies    Barriers of Care:  Knowledge and compliance  Discharge Planning:   Plans to discharge to home alone.  She would benefit from [facility] for ongoing education, compliance reinforcement, and symptom recognition.

## 2015-09-12 NOTE — Evaluation (Signed)
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  She will benefit from skilled PT in the acute setting to allow return home with HHPT to resume.    Follow Up Recommendations Home health PT    Equipment Recommendations  None recommended by PT    Recommendations for Other Services       Precautions / Restrictions Precautions Precautions: Fall      Mobility  Bed Mobility               General bed mobility comments: sitting in recliner  Transfers Overall transfer level: Modified independent Equipment used: None             General transfer comment: pushed up with arms on chair to stand  Ambulation/Gait Ambulation/Gait assistance: Supervision Ambulation Distance (Feet): 80 Feet Assistive device: Straight cane Gait Pattern/deviations: Step-through pattern;Decreased stride length     General Gait Details: walking upright, but had to lean down on elbow briefly in hallway due to weakness and fatigue  Stairs            Wheelchair Mobility    Modified Rankin (Stroke Patients Only)       Balance Overall balance assessment: No apparent balance deficits (not formally assessed)                                           Pertinent Vitals/Pain Pain Assessment: No/denies pain    Home Living Family/patient expects to be discharged to:: Private residence Living  Arrangements: Alone Available Help at Discharge: Family;Available PRN/intermittently Type of Home: Apartment Home Access: Level entry     Home Layout: One level Home Equipment: Cane - single point;Toilet riser;Grab bars - tub/shower Additional Comments: stool in shower    Prior Function Level of Independence: Independent with assistive device(s)         Comments: friends assist to get to appts and grocery     Hand Dominance   Dominant Hand: Right    Extremity/Trunk Assessment               Lower Extremity Assessment: Generalized weakness (and edema throughout LE's)         Communication   Communication: No difficulties  Cognition Arousal/Alertness: Awake/alert Behavior During Therapy: WFL for tasks assessed/performed Overall Cognitive Status: Within Functional Limits for tasks assessed                      General Comments      Exercises        Assessment/Plan    PT Assessment Patient needs continued PT services  PT Diagnosis Difficulty walking;Generalized weakness   PT Problem List Decreased activity tolerance;Decreased range of motion;Decreased knowledge of use of DME;Decreased strength;Decreased balance;Decreased mobility  PT Treatment Interventions DME instruction;Balance training;Gait training;Functional mobility training;Patient/family education;Therapeutic activities;Therapeutic exercise   PT Goals (Current goals can be found in the  Care Plan section) Acute Rehab PT Goals Patient Stated Goal: To return home to independent PT Goal Formulation: With patient Time For Goal Achievement: 09/19/15 Potential to Achieve Goals: Good    Frequency Min 3X/week   Barriers to discharge        Co-evaluation               End of Session Equipment Utilized During Treatment: Gait belt Activity Tolerance: Patient limited by fatigue Patient left: with call bell/phone within reach;in chair           Time: 1350-1418 PT Time Calculation  (min) (ACUTE ONLY): 28 min   Charges:   PT Evaluation $Initial PT Evaluation Tier I: 1 Procedure PT Treatments $Gait Training: 8-22 mins   PT G Codes:        WYNN,CYNDI Oct 01, 2015, 2:31 PM Sheran Lawless, PT [phone] 10-01-15

## 2015-09-13 LAB — BASIC METABOLIC PANEL
Anion gap: 10 (ref 5–15)
BUN: 49 mg/dL — AB (ref 6–20)
CHLORIDE: 102 mmol/L (ref 101–111)
CO2: 27 mmol/L (ref 22–32)
Calcium: 9 mg/dL (ref 8.9–10.3)
Creatinine, Ser: 2.06 mg/dL — ABNORMAL HIGH (ref 0.44–1.00)
GFR calc Af Amer: 26 mL/min — ABNORMAL LOW (ref >60)
GFR calc non Af Amer: 22 mL/min — ABNORMAL LOW (ref >60)
GLUCOSE: 90 mg/dL (ref 65–99)
POTASSIUM: 3.8 mmol/L (ref 3.5–5.1)
Sodium: 139 mmol/L (ref 135–145)

## 2015-09-13 MED ORDER — FUROSEMIDE 10 MG/ML IJ SOLN
80.0000 mg | Freq: Two times a day (BID) | INTRAMUSCULAR | Status: DC
  Administered 2015-09-13 – 2015-09-16 (×6): 80 mg via INTRAVENOUS
  Filled 2015-09-13 (×6): qty 8

## 2015-09-13 MED ORDER — SENNOSIDES-DOCUSATE SODIUM 8.6-50 MG PO TABS
2.0000 | ORAL_TABLET | Freq: Two times a day (BID) | ORAL | Status: DC
  Administered 2015-09-13 – 2015-09-14 (×3): 2 via ORAL
  Filled 2015-09-13 (×5): qty 2

## 2015-09-13 MED ORDER — FUROSEMIDE 10 MG/ML IJ SOLN
40.0000 mg | Freq: Once | INTRAMUSCULAR | Status: AC
  Administered 2015-09-13: 40 mg via INTRAVENOUS
  Filled 2015-09-13: qty 4

## 2015-09-13 NOTE — Progress Notes (Signed)
  Date: 09/13/2015  Patient name: Anita Wiley  Medical record number: [id number]  Date of birth: 23-May-1941   This patient's plan of care was discussed with the house staff. Please see Dr. Bonney Roussel note for complete details. I concur with his findings.  Patient is slowly getting better.  She is only having modest diuresis, so will increase lasix today.  JVD persists, mildly improved to mid neck.  LE edema continues with 3+ pitting to knees.  Cr is stable.  Monitor in the AM.  Ambulate, PT is seeing her.    [name], MD 09/13/2015, 2:03 PM

## 2015-09-13 NOTE — Evaluation (Signed)
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 No further acute OT indicated at this time    Follow Up Recommendations  No OT follow up    Equipment Recommendations  None recommended by OT    Recommendations for Other Services       Precautions / Restrictions Precautions Precautions: Fall Restrictions Weight Bearing Restrictions: No      Mobility Bed Mobility               General bed mobility comments: sitting in recliner  Transfers Overall transfer level: Modified independent Equipment used: None;Straight cane                  Balance Overall balance assessment: No apparent balance deficits (not formally assessed)                                          ADL Overall ADL's : Modified independent;At baseline                                             Vision  wears glasses   Perception Perception Perception Tested?: No   Praxis Praxis Praxis tested?: Not tested    Pertinent Vitals/Pain Pain Assessment: No/denies pain     Hand Dominance Right   Extremity/Trunk Assessment Upper Extremity Assessment Upper Extremity Assessment: Generalized weakness   Lower Extremity Assessment Lower Extremity Assessment: Defer to PT evaluation   Cervical / Trunk Assessment Cervical / Trunk Assessment: Normal   Communication Communication Communication: No  difficulties   Cognition Arousal/Alertness: Awake/alert Behavior During Therapy: WFL for tasks assessed/performed Overall Cognitive Status: Within Functional Limits for tasks assessed                     General Comments   pt very pleasant and cooperative                 Home Living Family/patient expects to be discharged to:: Private residence Living Arrangements: Alone Available Help at Discharge: Family;Available PRN/intermittently Type of Home: Apartment Home Access: Level entry     Home Layout: One level     Bathroom Shower/Tub: Tub/shower unit Shower/tub characteristics: Curtain Firefighter: Standard     Home Equipment: Cane - single point;Toilet riser;Grab bars - tub/shower          Prior Functioning/Environment Level of Independence: Needs assistance    ADL's / Homemaking Assistance Needed: Friends/neighbors assist with housekeeping, laundry. Independent with ADLs and cooking, doesn't drive   Comments: friends assist to get to appts and grocery    OT Diagnosis: Generalized weakness   OT Problem List: Decreased activity tolerance   OT Treatment/Interventions:      OT Goals(Current goals can be found in the care plan  section) Acute Rehab OT Goals Patient Stated Goal: To return home to independent OT Goal Formulation: With patient  OT Frequency:     Barriers to D/C:                          End of Session Equipment Utilized During Treatment: Other (comment) (cane)  Activity Tolerance: Patient tolerated treatment well Patient left: in chair;with call bell/phone within reach   Time: 4540-9811 OT Time Calculation (min): 17 min Charges:  OT General Charges $OT Visit: 1 Procedure OT Evaluation $Initial OT Evaluation Tier I: 1 Procedure G-Codes:    Galen Manila 09/13/2015, 1:05 PM

## 2015-09-13 NOTE — Progress Notes (Signed)
Subjective: Ms. Shad reports feeling better this morning. Her SOB has improved with the diuresis. Denies any chest pain. Still having dyspnea with exertion. Weight stable.   Objective: Vital signs in last 24 hours: Filed Vitals:   09/13/15 0357 09/13/15 0753 09/13/15 0900 09/13/15 1155  BP: 109/52 151/51 133/56 113/58  Pulse: 60 64 66 73  Temp: 98.3 F (36.8 C)  98.4 F (36.9 C) 97.9 F (36.6 C)  TempSrc: Oral  Oral Oral  Resp: 18   18  Height:      Weight: 203 lb 9.6 oz (92.352 kg)     SpO2: 100%  97% 98%   Weight change: -3 lb 9.6 oz (-1.633 kg)  Intake/Output Summary (Last 24 hours) at 09/13/15 1311 Last data filed at 09/13/15 0900  Gross per 24 hour  Intake    700 ml  Output   1950 ml  Net  -1250 ml    GENERAL- alert, co-operative, not in any distress. HEENT- Atraumatic, normocephalic, oral mucosa appears moist, good. JVD improved.  CARDIAC- RRR, no murmurs, rubs or gallops. RESP- Moving equal volumes of air, no wheezes or mild bibasilar crackles. ABDOMEN- Soft, nontender, no guarding or rebound, bowel sounds present. NEURO-AAOx3 EXTREMITIES- 2+ pitting edema to knee, LLE>RLE  SKIN- Warm, dry, No rash or lesion. PSYCH- Normal mood and affect, appropriate thought content and speech.  Medications: I have reviewed the patient's current medications. Scheduled Meds: . allopurinol  100 mg Oral Daily  . aspirin EC  81 mg Oral Daily  . carvedilol  12.5 mg Oral BID WC  . enoxaparin (LOVENOX) injection  30 mg Subcutaneous Q24H  . ferrous sulfate  325 mg Oral Q breakfast  . furosemide  40 mg Intravenous BID  . isosorbide-hydrALAZINE  1 tablet Oral TID  . potassium chloride  20 mEq Oral Daily  . senna-docusate  2 tablet Oral BID  . sodium chloride  3 mL Intravenous Q12H  . verapamil  180 mg Oral Daily   Continuous Infusions:  PRN Meds:.sodium chloride, acetaminophen, ondansetron (ZOFRAN) IV, sodium chloride Assessment/Plan:  Diastolic CHF exacerbation: Patient  initially diuresed well with 1.5 L out from IV Lasix 40 mg with 4lb weight loss. Only had 800 mL net output yesterday. Weight stable at 203 lbs. Dry weight 192-195 lbs. Patient is symptomatically improving but still dyspneic with exertion.  -Increase to  IV lasix twice daily - PO potassium daily -Daily BMPs -Daily weights -Ins and outs -PT/OT  Acute renal failure superimposed on stage 3 chronic kidney disease: Cr stable at 2.06. Baseline creatine 1.3, 2.1 on admission. Likely cardiorenal. Will monitor with diuresis. -BMP daily  HTN (hypertension): BP stable. On coreg and bidil at home. Also on verapamil. -Coreg 12/5 mg bid -Bidil 20-37.5 mg tid -Verapamil 180 mg daily  Anemia: Hgb stable at 8.7. Baseline ~9. -Iron sulfate pills  Constipation: She says she's been constipating on her iron pills. -Senakot bid  CAD, nonocclusive, s/p pacemaker: On ASA 81 mg   DVT PPx: Lovenox  Dispo: Disposition is deferred at this time, awaiting improvement of current medical problems.  Anticipated discharge in approximately 1-2 day(s).   The patient does have a current PCP (Angela Adam, MD) and does need an [facility] hospital follow-up appointment after discharge.  The patient does not have transportation limitations that hinder transportation to clinic appointments.  .Services Needed at time of discharge: Y = Yes, Blank = No PT:   OT:   RN:   Equipment:   Other:  LOS: 2 days   Valentino Nose, MD IMTS PGY-1 [phone] 09/13/2015, 1:11 PM

## 2015-09-13 NOTE — Progress Notes (Signed)
Patient is active with [facility] as prior to admission for HHRN/ PT; [name] [phone]

## 2015-09-14 LAB — BASIC METABOLIC PANEL
ANION GAP: 12 (ref 5–15)
BUN: 51 mg/dL — AB (ref 6–20)
CO2: 27 mmol/L (ref 22–32)
Calcium: 8.9 mg/dL (ref 8.9–10.3)
Chloride: 96 mmol/L — ABNORMAL LOW (ref 101–111)
Creatinine, Ser: 2.23 mg/dL — ABNORMAL HIGH (ref 0.44–1.00)
GFR calc Af Amer: 23 mL/min — ABNORMAL LOW (ref >60)
GFR, EST NON AFRICAN AMERICAN: 20 mL/min — AB (ref >60)
GLUCOSE: 84 mg/dL (ref 65–99)
POTASSIUM: 3.4 mmol/L — AB (ref 3.5–5.1)
Sodium: 135 mmol/L (ref 135–145)

## 2015-09-14 NOTE — Care Management Important Message (Signed)
Important Message  Patient Details  Name: Anita Wiley [name]: [id number] Date of Birth: 07-01-1941   Medicare Important Message Given:  Yes-second notification given    Orson Aloe 09/14/2015, 12:25 PM

## 2015-09-14 NOTE — Progress Notes (Signed)
Subjective: Patient seen and examined this morning. She reports that she has been able to sleep with 2 pillows. Still having dyspnea with exertion. Complaining of left dorsal foot pain while laying in bed and walking.   Objective: Vital signs in last 24 hours: Filed Vitals:   09/13/15 1733 09/13/15 2016 09/14/15 0528 09/14/15 1005  BP: 106/45 117/61 134/57 121/53  Pulse: 61 60 64 60  Temp:  98.4 F (36.9 C) 98.4 F (36.9 C)   TempSrc:  Oral Oral   Resp:  18 19   Height:      Weight:   201 lb 14.4 oz (91.581 kg)   SpO2:  100% 100%    Weight change: -1 lb 11.2 oz (-0.771 kg)  Intake/Output Summary (Last 24 hours) at 09/14/15 1059 Last data filed at 09/14/15 0853  Gross per 24 hour  Intake   1160 ml  Output   2100 ml  Net   -940 ml    PHYSICAL EXAM GENERAL- alert, co-operative, not in any distress. HEENT- Atraumatic, normocephalic, oral mucosa appears moist, good. JVD improved.  CARDIAC- RRR, no murmurs, rubs or gallops. RESP- Moving equal volumes of air, no wheezes or mild crackles on left. ABDOMEN- Soft, nontender, no guarding or rebound, bowel sounds present. NEURO-AAOx3 EXTREMITIES- 2+ pitting edema to ankle, improving, LLE>RLE  SKIN- Warm, dry, No rash or lesion. PSYCH- Normal mood and affect, appropriate thought content and speech.  Medications: I have reviewed the patient's current medications. Scheduled Meds: . allopurinol  100 mg Oral Daily  . aspirin EC  81 mg Oral Daily  . carvedilol  12.5 mg Oral BID WC  . enoxaparin (LOVENOX) injection  30 mg Subcutaneous Q24H  . ferrous sulfate  325 mg Oral Q breakfast  . furosemide  80 mg Intravenous BID  . isosorbide-hydrALAZINE  1 tablet Oral TID  . potassium chloride  20 mEq Oral Daily  . senna-docusate  2 tablet Oral BID  . sodium chloride  3 mL Intravenous Q12H  . verapamil  180 mg Oral Daily   Continuous Infusions:  PRN Meds:.sodium chloride, acetaminophen, ondansetron (ZOFRAN) IV, sodium  chloride Assessment/Plan:  Diastolic CHF exacerbation: Net 1.3 L output yesterday. Weight down 2lbs. Still 6lbs off dry weight. Symptomatically improving. Cr continues to rise, will monitor but likely cardiorenal and will improve with continued diuresis. If not improving will consider other etiologies.  -Continue to  IV lasix twice daily - PO potassium daily -Daily BMPs -Daily weights -Ins and outs -PT/OT  Acute renal failure superimposed on stage 3 chronic kidney disease: Cr continuing to rise, 2.23 today. Baseline creatine 1.3, 2.1 on admission. Likely cardiorenal. Will monitor with diuresis. -BMP daily  HTN (hypertension): BP stable. On coreg and bidil at home. Also on verapamil. -Coreg 12.5 mg bid -Bidil 20-37.5 mg tid -Verapamil 180 mg daily  Anemia: Hgb stable at 8.7. Baseline ~9. -Continue Iron sulfate pills  Constipation: Patient had 2 BMs yesterday, 1 today. -Senakot bid  CAD, nonocclusive, s/p pacemaker: On ASA 81 mg   DVT PPx: Lovenox  Dispo: Disposition is deferred at this time, awaiting improvement of current medical problems.  Anticipated discharge in approximately 1-2 day(s).   The patient does have a current PCP (Angela Adam, MD) and does need an [facility] hospital follow-up appointment after discharge.  The patient does not have transportation limitations that hinder transportation to clinic appointments.  .Services Needed at time of discharge: Y = Yes, Blank = No PT:   OT:   RN:  Equipment:   Other:     LOS: 3 days   Valentino Nose, MD IMTS PGY-1 [phone] 09/14/2015, 10:59 AM

## 2015-09-14 NOTE — Progress Notes (Signed)
Physical Therapy Treatment Patient Details Name: Wilhelmine Krogstad [name]: [id number] DOB: 02/23/1941 Today's Date: 09/14/2015    History of Present Illness Mrs. Echavarria is a 74 year old African American lady with history of non-ischemic heart failure with preserved ejection fraction (EF 40-45%), pacemaker for bradycardia, chronic kidney disease stage 3, hypertension, and gout who presents with a one month history of progressively worsening shortness of breath, orthopnea, lower leg swelling, and 8 pound weight gain in the last 4 days    PT Comments    Pt admitted with above diagnosis. Pt currently with functional limitations due to balance and endurance deficits. Pt able to ambulate with RW with good safety awareness with RW.  Will need RW at home and pt agrees to use if.   Pt will benefit from skilled PT to increase their independence and safety with mobility to allow discharge to the venue listed below.    Follow Up Recommendations  Home health PT     Equipment Recommendations  Rolling walker with 5" wheels    Recommendations for Other Services       Precautions / Restrictions Precautions Precautions: Fall Restrictions Weight Bearing Restrictions: No    Mobility  Bed Mobility Overal bed mobility: Modified Independent                Transfers Overall transfer level: Needs assistance Equipment used: Rolling walker (2 wheeled) Transfers: Sit to/from Stand Sit to Stand: Supervision;Min guard         General transfer comment: Steadying assist needed initially  Ambulation/Gait Ambulation/Gait assistance: Supervision Ambulation Distance (Feet): 120 Feet Assistive device: Rolling walker (2 wheeled) Gait Pattern/deviations: Step-through pattern;Decreased stride length   Gait velocity interpretation: Below normal speed for age/gender General Gait Details: Pt with good safety awareness with RW.     Stairs            Wheelchair Mobility    Modified Rankin (Stroke  Patients Only)       Balance Overall balance assessment: No apparent balance deficits (not formally assessed)                                  Cognition Arousal/Alertness: Awake/alert Behavior During Therapy: WFL for tasks assessed/performed Overall Cognitive Status: Within Functional Limits for tasks assessed                      Exercises General Exercises - Lower Extremity Long Arc Quad: AROM;Both;15 reps;Seated    General Comments        Pertinent Vitals/Pain Pain Assessment: No/denies pain  VSS    Home Living                      Prior Function            PT Goals (current goals can now be found in the care plan section) Progress towards PT goals: Progressing toward goals    Frequency  Min 3X/week    PT Plan Current plan remains appropriate    Co-evaluation             End of Session Equipment Utilized During Treatment: Gait belt Activity Tolerance: Patient tolerated treatment well Patient left: in chair;with call bell/phone within reach;with nursing/sitter in room;with chair alarm set     Time: 1006-1020 PT Time Calculation (min) (ACUTE ONLY): 14 min  Charges:  $Gait Training: 8-22 mins  G CodesBerline Lopes 09/14/2015, 2:42 PM [employer] [facility] [phone] (pager)

## 2015-09-14 NOTE — Progress Notes (Signed)
Patient is active with [facility] as prior to admission for [facility] and PT; [name] [phone]

## 2015-09-14 NOTE — Progress Notes (Addendum)
  Date: 09/14/2015  Patient name: Anita Wiley  Medical record number: [id number]  Date of birth: 1941/08/13   This patient's plan of care was discussed with the house staff. Please see Dr. Bonney Roussel note for complete details. I concur with his findings.  Ms. Belmar is improving this morning objectively with improved LE edema and improving pulmonary exam.  She continues to have SOB on exertion which may be more related to deconditioning at this point.  PT has seen her and is following her.  Recommended home health PT.  Continue IV diuresis for one more day.  She has a mildly increasing Cr which I think is likely related to diuresis, will recheck BMET in the AM.  If foot pain still present tomorrow, would consider xray of the foot.  This could also be related to gout given the high dose diuretics she is getting.    [name], MD 09/14/2015, 3:36 PM

## 2015-09-15 ENCOUNTER — Inpatient Hospital Stay (HOSPITAL_COMMUNITY): Payer: Medicare Other

## 2015-09-15 LAB — BASIC METABOLIC PANEL
ANION GAP: 9 (ref 5–15)
BUN: 47 mg/dL — ABNORMAL HIGH (ref 6–20)
CALCIUM: 9 mg/dL (ref 8.9–10.3)
CO2: 28 mmol/L (ref 22–32)
Chloride: 101 mmol/L (ref 101–111)
Creatinine, Ser: 1.93 mg/dL — ABNORMAL HIGH (ref 0.44–1.00)
GFR, EST AFRICAN AMERICAN: 28 mL/min — AB (ref >60)
GFR, EST NON AFRICAN AMERICAN: 24 mL/min — AB (ref >60)
Glucose, Bld: 81 mg/dL (ref 65–99)
POTASSIUM: 3.4 mmol/L — AB (ref 3.5–5.1)
Sodium: 138 mmol/L (ref 135–145)

## 2015-09-15 LAB — MAGNESIUM: MAGNESIUM: 2.2 mg/dL (ref 1.7–2.4)

## 2015-09-15 MED ORDER — SENNOSIDES-DOCUSATE SODIUM 8.6-50 MG PO TABS
2.0000 | ORAL_TABLET | Freq: Two times a day (BID) | ORAL | Status: DC | PRN
  Administered 2015-09-15 – 2015-09-19 (×2): 2 via ORAL
  Filled 2015-09-15: qty 2

## 2015-09-15 MED ORDER — POTASSIUM CHLORIDE ER 10 MEQ PO TBCR
20.0000 meq | EXTENDED_RELEASE_TABLET | Freq: Two times a day (BID) | ORAL | Status: DC
  Administered 2015-09-15 (×2): 20 meq via ORAL
  Filled 2015-09-15 (×6): qty 2

## 2015-09-15 NOTE — Progress Notes (Signed)
Subjective: Patient seen and examined this morning. She reports that she is feeling much better, was able to walk yesterday but was told by PT that she would need a walker at home (she only has a cane). Dyspnea improved. No longer complaining of any foot pain.   Objective: Vital signs in last 24 hours: Filed Vitals:   09/14/15 1544 09/14/15 1754 09/14/15 2032 09/15/15 0542  BP: 128/63 111/43 104/66 127/65  Pulse:  64 64 74  Temp:  98 F (36.7 C) 98.6 F (37 C) 98.6 F (37 C)  TempSrc:  Oral Oral Oral  Resp:  Height:      Weight:    200 lb 2.8 oz (90.8 kg)  SpO2:  100% 100% 99%   Weight change: -1 lb 11.6 oz (-0.781 kg)  Intake/Output Summary (Last 24 hours) at 09/15/15 0925 Last data filed at 09/15/15 0838  Gross per 24 hour  Intake    480 ml  Output    750 ml  Net   -270 ml    PHYSICAL EXAM GENERAL- alert, co-operative, not in any distress. HEENT- Atraumatic, normocephalic, oral mucosa appears moist, good. JVD improved.  CARDIAC- RRR, 3/6 systolic murmur, rubs or gallops. RESP- Moving equal volumes of air, no wheezes or mild bibasilar crackles ABDOMEN- Soft, nontender, no guarding or rebound, bowel sounds present. NEURO-AAOx3 EXTREMITIES- 1+ pitting edema to knees, improving, LLE>RLE  SKIN- Warm, dry, No rash or lesion. PSYCH- Normal mood and affect, appropriate thought content and speech.  Medications: I have reviewed the patient's current medications. Scheduled Meds: . allopurinol  100 mg Oral Daily  . aspirin EC  81 mg Oral Daily  . carvedilol  12.5 mg Oral BID WC  . enoxaparin (LOVENOX) injection  30 mg Subcutaneous Q24H  . ferrous sulfate  325 mg Oral Q breakfast  . furosemide  80 mg Intravenous BID  . isosorbide-hydrALAZINE  1 tablet Oral TID  . potassium chloride  20 mEq Oral Daily  . senna-docusate  2 tablet Oral BID  . sodium chloride  3 mL Intravenous Q12H  . verapamil  180 mg Oral Daily   Continuous Infusions:  PRN Meds:.sodium chloride,  acetaminophen, ondansetron (ZOFRAN) IV, sodium chloride Assessment/Plan:  Diastolic CHF exacerbation: Weight down 1 lb. Still 5lbs off dry weight. Symptomatically improving. Cr improving, 1.93. Will monitor but likely cardiorenal and will improve with continued diuresis. CXR stable. Volume status improving. Cr still elevated from baseline. Will continue to aggressively diurese today and monitor Cr. Likely discharge tomorrow.  -Continue to  IV lasix twice daily - PO potassium bid -Daily BMPs -Daily weights -Ins and outs -PT/OT -Check Mg  Acute renal failure superimposed on stage 3 chronic kidney disease: Cr improving, 2.23-->1.93. Baseline creatine 1.3. 2.1 on admission. Likely cardiorenal. Will monitor with diuresis. -BMP daily  HTN (hypertension): BP stable. On coreg and bidil at home. Also on verapamil. -Coreg 12.5 mg bid -Bidil 20-37.5 mg tid -Verapamil 180 mg daily  Anemia: Hgb was stable at 8.7. Baseline ~9. -Continue Iron sulfate pills  Constipation: Having BMs. -Senakot bid prn  CAD, nonocclusive, s/p pacemaker: On ASA 81 mg   DVT PPx: Lovenox  Dispo: Likely discharge tomorrow  The patient does have a current PCP (Angela Adam, MD) and does need an [facility] hospital follow-up appointment after discharge.  The patient does not have transportation limitations that hinder transportation to clinic appointments.  .Services Needed at time of discharge: Y = Yes, Blank = No PT:  OT:   RN:   Equipment:   Other:     LOS: 4 days   Valentino Nose, MD IMTS PGY-1 [phone] 09/15/2015, 9:25 AM

## 2015-09-16 DIAGNOSIS — I2510 Atherosclerotic heart disease of native coronary artery without angina pectoris: Secondary | ICD-10-CM

## 2015-09-16 DIAGNOSIS — Z7982 Long term (current) use of aspirin: Secondary | ICD-10-CM

## 2015-09-16 DIAGNOSIS — I5033 Acute on chronic diastolic (congestive) heart failure: Principal | ICD-10-CM

## 2015-09-16 DIAGNOSIS — N183 Chronic kidney disease, stage 3 (moderate): Secondary | ICD-10-CM

## 2015-09-16 DIAGNOSIS — D649 Anemia, unspecified: Secondary | ICD-10-CM

## 2015-09-16 DIAGNOSIS — N179 Acute kidney failure, unspecified: Secondary | ICD-10-CM

## 2015-09-16 DIAGNOSIS — I130 Hypertensive heart and chronic kidney disease with heart failure and stage 1 through stage 4 chronic kidney disease, or unspecified chronic kidney disease: Secondary | ICD-10-CM

## 2015-09-16 DIAGNOSIS — Z950 Presence of cardiac pacemaker: Secondary | ICD-10-CM

## 2015-09-16 LAB — BASIC METABOLIC PANEL
ANION GAP: 10 (ref 5–15)
BUN: 42 mg/dL — ABNORMAL HIGH (ref 6–20)
CALCIUM: 8.9 mg/dL (ref 8.9–10.3)
CHLORIDE: 99 mmol/L — AB (ref 101–111)
CO2: 27 mmol/L (ref 22–32)
Creatinine, Ser: 1.9 mg/dL — ABNORMAL HIGH (ref 0.44–1.00)
GFR calc non Af Amer: 25 mL/min — ABNORMAL LOW (ref >60)
GFR, EST AFRICAN AMERICAN: 28 mL/min — AB (ref >60)
GLUCOSE: 161 mg/dL — AB (ref 65–99)
POTASSIUM: 3.4 mmol/L — AB (ref 3.5–5.1)
Sodium: 136 mmol/L (ref 135–145)

## 2015-09-16 MED ORDER — FUROSEMIDE 10 MG/ML IJ SOLN
80.0000 mg | Freq: Three times a day (TID) | INTRAMUSCULAR | Status: DC
  Administered 2015-09-16 (×2): 80 mg via INTRAVENOUS
  Filled 2015-09-16 (×2): qty 8

## 2015-09-16 MED ORDER — POTASSIUM CHLORIDE CRYS ER 20 MEQ PO TBCR
40.0000 meq | EXTENDED_RELEASE_TABLET | Freq: Two times a day (BID) | ORAL | Status: DC
  Administered 2015-09-16 – 2015-09-22 (×13): 40 meq via ORAL
  Filled 2015-09-16: qty 4
  Filled 2015-09-16 (×5): qty 2
  Filled 2015-09-16: qty 4
  Filled 2015-09-16 (×3): qty 2
  Filled 2015-09-16: qty 4
  Filled 2015-09-16 (×7): qty 2

## 2015-09-16 NOTE — Progress Notes (Signed)
Patient ID: Sianna Garofano, female   DOB: [birth year], 74 y.o.   [name]: [id number]  Date: 09/16/2015  Patient name: Anzlee Hinesley  Medical record number: [id number]  Date of birth: 07-Feb-1941   This patient's plan of care was discussed with the house staff. Please see their note for complete details. I concur with their findings. Her decompensation heart failure is improving but she continues to have some basilar crackles posteriorly and pitting edema of her lower legs. Her weight is down but she still remains about 5 pounds over her dry weight. She is very anxious about going home too soon. We will keep her here today and continue diuresis.   Cliffton Asters, MD 09/16/2015, 10:24 AM

## 2015-09-16 NOTE — Discharge Summary (Signed)
Name: Anita Wiley [name]: [id number] DOB: 20-Dec-1940 74 y.o. PCP: Angela Adam, Wiley  Date of Admission: 09/11/2015  5:44 PM Date of Discharge: 09/22/2015 Attending Physician: [name], Wiley  Discharge Diagnosis: Principal Problem:   CHF exacerbation (HCC) Active Problems:   S/P placement of cardiac pacemaker   HTN (hypertension)   Acute renal failure superimposed on stage 3 chronic kidney disease (HCC)   Coronary artery disease, non-occlusive by cath   Gout   Peripheral edema   SOB (shortness of breath)   Acute on chronic congestive heart failure (HCC)  Discharge Medications:   Medication List    STOP taking these medications        azithromycin 500 MG tablet  Commonly known as:  ZITHROMAX     cefUROXime 500 MG tablet  Commonly known as:  CEFTIN     furosemide 40 MG tablet  Commonly known as:  LASIX     isosorbide-hydrALAZINE 20-37.5 MG tablet  Commonly known as:  BIDIL      TAKE these medications        allopurinol 100 MG tablet  Commonly known as:  ZYLOPRIM  Take 100 mg by mouth daily.     aspirin 81 MG tablet  Take 81 mg by mouth daily.     carvedilol 12.5 MG tablet  Commonly known as:  COREG  Take 1 tablet (12.5 mg total) by mouth 2 (two) times daily with a meal.     ferrous sulfate 325 (65 FE) MG tablet  Take 325 mg by mouth daily with breakfast.     Potassium Chloride ER 20 MEQ Tbcr  Take 40 mEq by mouth daily.     torsemide 20 MG tablet  Commonly known as:  DEMADEX  TAKE 2 TABLETS IN THE MORNING AND 1 TABLET AT NIGHT.     verapamil 180 MG (CO) 24 hr tablet  Commonly known as:  COVERA HS  Take 180 mg by mouth daily.        Disposition and follow-up:   Ms.Anita Wiley was discharged from [facility] in Good condition.  At the hospital follow up visit please address:  1. CHF exacerbation with predominant RV failure: Please assess volume status. Discharged on torsemide 40 mg in the morning and 20 mg at night. Instructed to  keep daily weights and monitor fluid and salt intake. Has follow up with CHF clinic. 2. 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  3. HTN: Discharged on Coreg. Stopped her Bidil due to soft pressures.  4.  Labs / imaging needed at time of follow-up: BMET 5.  Pending labs/ test needing follow-up: PTH, 25-vit D, anemia panel, protein/cr   Follow-up Appointments: Follow-up Information    Follow up with Anita Wiley.   Why:  They will do your home health care at your home   Contact information:   [address] [place] [address] [phone]       Follow up with Anita Wiley On 10/03/2015.   Specialty:  Cardiology   Why:  at 1140 for post hospital follow up.  Please bring all of your medications to your appointment.  The code for patient parking is 0900.   Contact information:   [address] [place] [address] [phone]       Follow up with Anita Wiley.   Specialty:  Internal Medicine   Contact information:   [address]  [place] [address]       Follow up with [facility].   Why:  home health physical therapy   Contact information:   [address] [place] [address] [phone]       Discharge Instructions: Discharge Instructions    Diet - low sodium heart healthy    Complete by:  As directed      Increase activity slowly    Complete by:  As directed            Consultations:  Heart Failure Team  Procedures Performed:  Dg Chest 2 View  09/20/2015  CLINICAL DATA:  Shortness of breath. EXAM: CHEST  2 VIEW COMPARISON:  09/15/2015. FINDINGS: Cardiac pacer with lead tips in right atrium right ventricle. Cardiomegaly. No focal pulmonary infiltrate. No pleural effusion or pneumothorax. IMPRESSION: 1. Cardiac pacer with lead tips in right atrium and right ventricle. Cardiomegaly. No evidence of overt congestive heart failure. 2. No  acute pulmonary disease. Electronically Signed   By: Maisie Fus  Register   On: 09/20/2015 08:21   Dg Chest 2 View  09/15/2015  CLINICAL DATA:  CHF EXAM: CHEST  2 VIEW COMPARISON:  09/11/2015 chest radiograph FINDINGS: Stable configuration of dual lead left subclavian pacemaker. Stable cardiomediastinal silhouette with mild cardiomegaly and prominent main pulmonary artery contour. No pneumothorax. No pleural effusion. Stable mild pulmonary edema. No new focal lung opacity. IMPRESSION: Stable mild congestive heart failure. Electronically Signed   By: Delbert Phenix M.D.   On: 09/15/2015 08:36   Dg Chest 2 View  09/11/2015  CLINICAL DATA:  Worsening dyspnea. EXAM: CHEST  2 VIEW COMPARISON:  08/04/2015 FINDINGS: There is marked cardiomegaly, unchanged. There are intact appearances of the transvenous cardiac leads. Mild central vascular prominence is present, without frank alveolar or interstitial edema. There is no pleural effusion. The lungs are clear. Incidental note is made of severe bilateral shoulder arthropathy, likely due to chronic rotator cuff tears. IMPRESSION: Marked cardiomegaly. Moderate chronic-appearing central vascular prominence. Electronically Signed   By: Ellery Plunk M.D.   On: 09/11/2015 18:19   2D ECHO: Study Conclusions  - Left ventricle: The cavity size was normal. There was moderate concentric hypertrophy. Systolic function was normal. The estimated ejection fraction was in the range of 60% to 65%. Wall motion was normal; there were no regional wall motion abnormalities. Features are consistent with a pseudonormal left ventricular filling pattern, with concomitant abnormal relaxation and increased filling pressure (grade 2 diastolic dysfunction). - Aortic valve: There was trivial regurgitation. - Mitral valve: There was mild regurgitation. - Left atrium: The atrium was mildly dilated. - Right atrium: The atrium was mildly dilated. - Tricuspid valve: There was  moderate regurgitation. - Pulmonic valve: There was moderate regurgitation. - Pulmonary arteries: Systolic pressure was severely increased. PA peak pressure: 73 mm Hg (S).  Impressions: - No significant change from prior echocardiogram.  Cath: Conclusion    1. Mildly elevated left heart filling pressure.  2. Significantly elevated right heart filling pressure.  3. Pulmonary venous hypertension.   Admission HPI: Mrs. Totten is a 74 year old African American lady with history of non-ischemic heart failure with preserved ejection fraction (EF 40-45%), pacemaker for bradycardia, chronic kidney disease stage 3, hypertension, and gout who presents with a one month history of progressively worsening shortness of breath, orthopnea, lower leg swelling, and 8 pound weight gain in the last 4 days. She was hospitalized one month ago for a CHF exacerbation and was discharged home with  40mg  Lasix daily; her ejection fraction at that time was 40-45%. She's been taking her Lasix as prescribed, he drinks about 40oz of water per day, and she has not changed her diet recently. She had a heart cath in November 2015 that was clean. She denies any chest pain, fevers, chills, abdominal pain, dysuria, or other complaints.  In the emergency department, her vital signs were normal, a BNP was elevated from 600 last admission to 1200 today, her creatinine was elevated from her baseline of 1.3 to 2.1, and chest x-ray showed pulmonary vascular congestion. Her EKG was unchanged from prior and a point-of-care  Hospital Course by problem list: Principal Problem:   CHF exacerbation (HCC) Active Problems:   S/P placement of cardiac pacemaker   HTN (hypertension)   Acute renal failure superimposed on stage 3 chronic kidney disease (HCC)   Coronary artery disease, non-occlusive by cath   Gout   Peripheral edema   SOB (shortness of breath)   Acute on chronic congestive heart failure (HCC)   Congestive heart failure  exacerbation: Diuresed with IV Lasix 40 mg BID. Initially good urine output and weight improved but response quickly fell off. Gradually increased her lasix from 80 mg IV bid to 120 mg IV tid and patient was still not returning to baseline weight and still volume overloaded. CHF team was consulted. She was started on a lasix drip. Continued to have limited response and RHC was done to assess residual volume and pulmonary hypertension. 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 Transitioned to torsemide. Creatinine remained elevated above her baseline. Still had some signs of volume overload on exam but patient denied any shortness of breath. It was felt that it would not be possible to diurses her down to a normal CVP with RV failure and rising creatinine with aggressive diuresis. Discharged on torsemide 40 mg qam and 20 mg at night.   Hypokalemia: Resolved with replacement.  Acute on Chronic kidney disease: Cr was 2.1 on admission, previous baseline around 1.3-1.5 Little improved to around 1.9 with diuresis but rose following aggressive diuresis with lasix ggt. Creatinine was 2.3 at discharge. Likely seeing her new baseline, progressing form CKD 3 to 4. Obtained PTH, 25-vit D, anemia panel and protein/cr ratio. Will need to be established with a nephrologist as an outpatient for further management.   HTN (hypertension): BP was low during hospitalizatoin. Continued home dose Coreg 12.5 mg bid but stopped Bidil 20-37.5 mg tid for soft blood pressures. Coreg was continued at discharge. Bidil was held at discharge. Please assess if patient is able to restart this medication.   Anemia: Chronic anemia. Stable at around 9 during admission. Continued home Fe pills.   Constipation: She said she's been constipated on her iron pills which were recently started. Improved on Senakot. Discharged with Colace daily.     Discharge Vitals:   BP 104/47 mmHg  Pulse 94  Temp(Src) 98 F (36.7 C)  (Oral)  Resp 18  Ht 5\' 5"  (1.651 m)  Wt 188 lb 3.2 oz (85.367 kg)  BMI 31.32 kg/m2  SpO2 99%  Discharge Labs:  Results for orders placed or performed during the hospital encounter of 09/11/15 (from the past 24 hour(s))  Protein / creatinine ratio, urine     Status: None   Collection Time: 09/22/15  4:05 AM  Result Value Ref Range   Creatinine, Urine 49.28 mg/dL   Total Protein, Urine <6 mg/dL   Protein Creatinine Ratio  0.00 - 0.15 mg/mg[Cre]  Basic metabolic panel     Status: Abnormal   Collection Time: 09/22/15  4:41 AM  Result Value Ref Range   Sodium 138 135 - 145 mmol/L   Potassium 4.2 3.5 - 5.1 mmol/L   Chloride 91 (L) 101 - 111 mmol/L   CO2 36 (H) 22 - 32 mmol/L   Glucose, Bld 87 65 - 99 mg/dL   BUN 57 (H) 6 - 20 mg/dL   Creatinine, Ser 1.61 (H) 0.44 - 1.00 mg/dL   Calcium 9.2 8.9 - 09.6 mg/dL   GFR calc non Af Amer 19 (L) >60 mL/min   GFR calc Af Amer 23 (L) >60 mL/min   Anion gap 11 5 - 15  Vitamin B12     Status: None   Collection Time: 09/22/15  4:41 AM  Result Value Ref Range   Vitamin B-12 843 180 - 914 pg/mL  Folate     Status: None   Collection Time: 09/22/15  4:41 AM  Result Value Ref Range   Folate 11.4 >5.9 ng/mL  Iron and TIBC     Status: Abnormal   Collection Time: 09/22/15  4:41 AM  Result Value Ref Range   Iron 29 28 - 170 ug/dL   TIBC 045 409 - 811 ug/dL   Saturation Ratios 10 (L) 10.4 - 31.8 %   UIBC 261 ug/dL  Ferritin     Status: None   Collection Time: 09/22/15  4:41 AM  Result Value Ref Range   Ferritin 66 11 - 307 ng/mL  Reticulocytes     Status: Abnormal   Collection Time: 09/22/15  4:41 AM  Result Value Ref Range   Retic Ct Pct 2.1 0.4 - 3.1 %   RBC. 3.05 (L) 3.87 - 5.11 MIL/uL   Retic Count, Manual 64.1 19.0 - 186.0 K/uL    Signed: Aldean Baker, Wiley 09/22/2015, 1:19 PM    Services Ordered on Discharge: Home PT Equipment Ordered on Discharge: Agricultural consultant with [employer]

## 2015-09-16 NOTE — Progress Notes (Addendum)
Subjective: Patient seen and examined this morning. She reports improvement in her shortness of breath. Doing better walking the halls but still having to stop frequently to catch her breath. No foot pain. Patient reports the nurses haven't weighed her in 2 days. Said she had to ask them to weigh her today. Reports her weight was 198 lbs today. Documented weights the past 2 days. Documented weight 200 lbs this morning.  Objective: Vital signs in last 24 hours: Filed Vitals:   09/15/15 1134 09/15/15 2008 09/16/15 0514 09/16/15 0750  BP: 109/60 121/53 119/58   Pulse: 67 63 69   Temp: 98.6 F (37 C) 98.7 F (37.1 C) 98.7 F (37.1 C)   TempSrc: Oral Oral Oral   Resp: Height:      Weight:   201 lb 4.5 oz (91.3 kg) 200 lb 2.8 oz (90.8 kg)  SpO2: 97% 98% 99%    Weight change: 1 lb 1.6 oz (0.5 kg)  Intake/Output Summary (Last 24 hours) at 09/16/15 1011 Last data filed at 09/16/15 0920  Gross per 24 hour  Intake   1080 ml  Output    550 ml  Net    530 ml    PHYSICAL EXAM GENERAL- alert, co-operative, not in any distress. HEENT- Atraumatic, normocephalic, oral mucosa appears moist. JVD improved.  CARDIAC- RRR, 3/6 systolic murmur, no rubs or gallops. RESP- Moving equal volumes of air, no wheezes. mild bibasilar crackles ABDOMEN- Soft, nontender, no guarding or rebound, bowel sounds present. NEURO-AAOx3 EXTREMITIES- 1+ pitting edema to mid shin, improving, LLE>RLE  SKIN- Warm, dry, No rash or lesion. PSYCH- Normal mood and affect, appropriate thought content and speech.  Medications: I have reviewed the patient's current medications. Scheduled Meds: . allopurinol  100 mg Oral Daily  . aspirin EC  81 mg Oral Daily  . carvedilol  12.5 mg Oral BID WC  . enoxaparin (LOVENOX) injection  30 mg Subcutaneous Q24H  . ferrous sulfate  325 mg Oral Q breakfast  . furosemide  80 mg Intravenous BID  . isosorbide-hydrALAZINE  1 tablet Oral TID  . potassium chloride  20 mEq Oral  BID  . sodium chloride  3 mL Intravenous Q12H  . verapamil  180 mg Oral Daily   Continuous Infusions:  PRN Meds:.sodium chloride, acetaminophen, ondansetron (ZOFRAN) IV, senna-docusate, sodium chloride Assessment/Plan:  Diastolic CHF exacerbation: Weight stable. Still 5lbs off dry weight. Symptomatically improving. Cr stable at 1.9. Will monitor but likely cardiorenal and will likely improve with continued diuresis. Cr still elevated from baseline, 1.3. Will continue to aggressively diurese today and monitor Cr. K unchanged at 3.4. Mg normal. -increase to  IV lasix tid - PO potassium bid -Daily BMPs -Daily weights -Ins and outs -PT/OT  Acute renal failure superimposed on stage 3 chronic kidney disease: Cr stable at 1.9. Baseline creatine 1.3. 2.1 on admission. Likely cardiorenal. Will monitor with diuresis. -BMP daily  HTN (hypertension): BP stable. On coreg and bidil at home. Also on verapamil. -Coreg 12.5 mg bid -Bidil 20-37.5 mg tid -Verapamil 180 mg daily  Anemia: Hgb was stable at 8.7. Baseline ~9. -Continue Iron sulfate pills  CAD, nonocclusive, s/p pacemaker: On ASA 81 mg   DVT PPx: Lovenox  Dispo: Likely discharge tomorrow  The patient does have a current PCP (Angela Adam, MD) and does need an [facility] hospital follow-up appointment after discharge.  The patient does not have transportation limitations that hinder transportation to clinic appointments.  .Services Needed at  time of discharge: Y = Yes, Blank = No PT:   OT:   RN:   Equipment:   Other:     LOS: 5 days   Valentino Nose, MD [facility] PGY-1 [phone] 09/16/2015, 10:11 AM

## 2015-09-17 DIAGNOSIS — I509 Heart failure, unspecified: Secondary | ICD-10-CM | POA: Insufficient documentation

## 2015-09-17 LAB — BASIC METABOLIC PANEL
Anion gap: 10 (ref 5–15)
BUN: 41 mg/dL — ABNORMAL HIGH (ref 6–20)
CHLORIDE: 98 mmol/L — AB (ref 101–111)
CO2: 30 mmol/L (ref 22–32)
Calcium: 9.1 mg/dL (ref 8.9–10.3)
Creatinine, Ser: 1.87 mg/dL — ABNORMAL HIGH (ref 0.44–1.00)
GFR calc non Af Amer: 25 mL/min — ABNORMAL LOW (ref >60)
GFR, EST AFRICAN AMERICAN: 29 mL/min — AB (ref >60)
Glucose, Bld: 85 mg/dL (ref 65–99)
POTASSIUM: 4.2 mmol/L (ref 3.5–5.1)
SODIUM: 138 mmol/L (ref 135–145)

## 2015-09-17 MED ORDER — FUROSEMIDE 10 MG/ML IJ SOLN
120.0000 mg | Freq: Three times a day (TID) | INTRAVENOUS | Status: DC
  Administered 2015-09-17 (×2): 120 mg via INTRAVENOUS
  Filled 2015-09-17 (×5): qty 12

## 2015-09-17 MED ORDER — FUROSEMIDE 10 MG/ML IJ SOLN
40.0000 mg | Freq: Once | INTRAMUSCULAR | Status: AC
  Administered 2015-09-17: 40 mg via INTRAVENOUS
  Filled 2015-09-17: qty 4

## 2015-09-17 NOTE — Care Management Important Message (Addendum)
Important Message  Patient Details  Name: Anita Wiley [name]: [id number] Date of Birth: 06-Oct-1941   Medicare Important Message Given:  Yes-third notification given    Orson Aloe 09/17/2015, 1:55 PM

## 2015-09-17 NOTE — Progress Notes (Signed)
  Date: 09/17/2015  Patient name: Anita Wiley  Medical record number: [id number]  Date of birth: 1940-12-25   This patient's plan of care was discussed with the house staff. Please see Dr. Bonney Roussel note for complete details. I concur with his findings.  Making slow but steady progress on her volume overload.  Her renal function has slightly improved since admission.  She is still volume overloaded on exam.   Increase lasix today.  Continue to monitor closely.  Up and walking as much as possible while in house.    [name], MD 09/17/2015, 2:32 PM

## 2015-09-17 NOTE — Progress Notes (Signed)
   Subjective: Patient seen and examined this morning. Dyspnea improving. Still complaining of some leg swelling. Did not walk yesterday.   Objective: Vital signs in last 24 hours: Filed Vitals:   09/16/15 1613 09/16/15 2049 09/17/15 0419 09/17/15 1055  BP: 117/62 119/53 119/51 127/47  Pulse: 77 66 70 61  Temp:  98.6 F (37 C) 98.9 F (37.2 C) 98.6 F (37 C)  TempSrc:  Oral Oral Oral  Resp:  Height:      Weight:   198 lb 6.6 oz (90 kg)   SpO2:  99% 97% 100%   Weight change: -1 lb 1.6 oz (-0.5 kg)  Intake/Output Summary (Last 24 hours) at 09/17/15 1148 Last data filed at 09/17/15 0843  Gross per 24 hour  Intake    720 ml  Output   1900 ml  Net  -1180 ml    PHYSICAL EXAM GENERAL- alert, co-operative, not in any distress. HEENT- Atraumatic, normocephalic, oral mucosa appears moist. JVD improved.  CARDIAC- RRR, 3/6 systolic murmur, no rubs or gallops. RESP- Moving equal volumes of air, no wheezes. minimal bibasilar crackles ABDOMEN- Soft, nontender, no guarding or rebound, bowel sounds present. NEURO-AAOx3 EXTREMITIES- 1+ pitting edema to mid shin, stable, LLE>RLE  SKIN- Warm, dry, No rash or lesion. PSYCH- Normal mood and affect, appropriate thought content and speech.  Medications: I have reviewed the patient's current medications. Scheduled Meds: . allopurinol  100 mg Oral Daily  . aspirin EC  81 mg Oral Daily  . carvedilol  12.5 mg Oral BID WC  . enoxaparin (LOVENOX) injection  30 mg Subcutaneous Q24H  . ferrous sulfate  325 mg Oral Q breakfast  . furosemide  120 mg Intravenous TID  . isosorbide-hydrALAZINE  1 tablet Oral TID  . potassium chloride  40 mEq Oral BID  . sodium chloride  3 mL Intravenous Q12H  . verapamil  180 mg Oral Daily   Continuous Infusions:  PRN Meds:.sodium chloride, acetaminophen, ondansetron (ZOFRAN) IV, senna-docusate, sodium chloride Assessment/Plan:  Diastolic CHF exacerbation: Weight improving, 200-->198lbs. Net 1L output  yesterday. Cr stable at 1.87. Still with crackles and 1+ LE edema. Will increase diuretics today.  -increase to  IV lasix tid - PO potassium bid -Daily BMPs -Daily weights -Ins and outs -PT/OT  Acute renal failure superimposed on stage 3 chronic kidney disease: Cr stable at 1.87. Baseline creatine 1.3-1.5. Likely cardiorenal. Will monitor with diuresis. -BMP daily  HTN (hypertension): BP stable. On coreg and bidil at home. Also on verapamil. -Coreg 12.5 mg bid -Bidil 20-37.5 mg tid -Verapamil 180 mg daily  Anemia: Hgb was stable at 8.7. Baseline ~9. -Continue Iron sulfate pills  CAD, nonocclusive, s/p pacemaker: On ASA 81 mg   DVT PPx: Lovenox  Dispo: Hopeful for discharge tomorrow  The patient does have a current PCP (Angela Adam, MD) and does need an [facility] hospital follow-up appointment after discharge.  The patient does not have transportation limitations that hinder transportation to clinic appointments.  .Services Needed at time of discharge: Y = Yes, Blank = No PT:   OT:   RN:   Equipment:   Other:     LOS: 6 days   Anita Nose, MD IMTS PGY-1 [phone] 09/17/2015, 11:48 AM

## 2015-09-18 ENCOUNTER — Inpatient Hospital Stay (HOSPITAL_COMMUNITY): Payer: Medicare Other

## 2015-09-18 DIAGNOSIS — I509 Heart failure, unspecified: Secondary | ICD-10-CM

## 2015-09-18 LAB — BASIC METABOLIC PANEL
ANION GAP: 10 (ref 5–15)
BUN: 46 mg/dL — AB (ref 6–20)
CALCIUM: 9.2 mg/dL (ref 8.9–10.3)
CO2: 31 mmol/L (ref 22–32)
Chloride: 97 mmol/L — ABNORMAL LOW (ref 101–111)
Creatinine, Ser: 1.96 mg/dL — ABNORMAL HIGH (ref 0.44–1.00)
GFR calc Af Amer: 27 mL/min — ABNORMAL LOW (ref >60)
GFR, EST NON AFRICAN AMERICAN: 24 mL/min — AB (ref >60)
GLUCOSE: 85 mg/dL (ref 65–99)
POTASSIUM: 4.2 mmol/L (ref 3.5–5.1)
Sodium: 138 mmol/L (ref 135–145)

## 2015-09-18 MED ORDER — FUROSEMIDE 10 MG/ML IJ SOLN
10.0000 mg/h | INTRAMUSCULAR | Status: DC
  Administered 2015-09-18: 10 mg/h via INTRAVENOUS
  Filled 2015-09-18 (×2): qty 25

## 2015-09-18 MED ORDER — METOLAZONE 5 MG PO TABS
5.0000 mg | ORAL_TABLET | Freq: Once | ORAL | Status: DC
  Filled 2015-09-18 (×2): qty 1

## 2015-09-18 MED ORDER — FUROSEMIDE 10 MG/ML IJ SOLN
80.0000 mg | Freq: Three times a day (TID) | INTRAMUSCULAR | Status: DC
  Administered 2015-09-18: 80 mg via INTRAVENOUS
  Filled 2015-09-18: qty 8

## 2015-09-18 MED ORDER — FUROSEMIDE 10 MG/ML IJ SOLN: 80.0000 mg | Freq: Three times a day (TID) | INTRAMUSCULAR | Status: DC

## 2015-09-18 MED ORDER — METOLAZONE 5 MG PO TABS
5.0000 mg | ORAL_TABLET | Freq: Once | ORAL | Status: AC
  Administered 2015-09-18: 5 mg via ORAL

## 2015-09-18 NOTE — Progress Notes (Signed)
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 Pt reports she plan on going home to brothers house so someone can be with her all the time. PT feels pt is safe for d/c home as long as there is 24/7 supervision.  Follow Up Recommendations  Home health PT     Equipment Recommendations  Rolling walker with 5" wheels    Recommendations for Other Services       Precautions / Restrictions Precautions Precautions: Fall Restrictions Weight Bearing Restrictions: No    Mobility  Bed Mobility Overal bed mobility: Modified Independent             General bed mobility comments: HOB elevated  Transfers Overall transfer level: Needs assistance Equipment used: Rolling walker (2 wheeled) Transfers: Sit to/from Stand Sit to Stand: Supervision         General transfer comment: v/c's to push up from bed not pull up from walker  Ambulation/Gait Ambulation/Gait assistance: Supervision Ambulation Distance (Feet): 120 Feet (x2) Assistive device: Rolling walker (2 wheeled) Gait Pattern/deviations: Step-through pattern;Decreased stride length;Trunk flexed Gait velocity: decreased   General Gait Details: pt with good walker management. required 2 standing rest breaks, holding onto hallway rail both times. Despite SOB, SpO2 >95%.   Stairs            Wheelchair Mobility     Modified Rankin (Stroke Patients Only)       Balance Overall balance assessment: No apparent balance deficits (not formally assessed)                                  Cognition Arousal/Alertness: Awake/alert Behavior During Therapy: WFL for tasks assessed/performed Overall Cognitive Status: Within Functional Limits for tasks assessed                      Exercises      General Comments        Pertinent Vitals/Pain Pain Assessment: No/denies pain    Home Living                      Prior Function            PT Goals (current goals can now be found in the care plan section) Acute Rehab PT Goals Patient Stated Goal: home asap Progress towards PT goals: Progressing toward goals    Frequency  Min 3X/week    PT Plan Current plan remains appropriate    Co-evaluation             End of Session Equipment Utilized During Treatment: Gait belt Activity Tolerance: Patient tolerated treatment well Patient left: in chair;with call bell/phone within reach;with chair alarm set     Time: [phone] PT Time Calculation (min) (ACUTE ONLY): 26 min  Charges:  $Gait Training: 23-37 mins  G CodesMarcene Brawn 09/18/2015, 11:53 AM   Lewis Shock, PT, DPT Pager #: [phone] Office #: [phone]

## 2015-09-18 NOTE — Progress Notes (Signed)
  Date: 09/18/2015  Patient name: Anita Wiley  Medical record number: [id number]  Date of birth: Apr 13, 1941   This patient's plan of care was discussed with the house staff. Please Dr. Bonney Roussel note for complete details. I concur with his findings.  Have reviewed HF Team note, advise lasix gtt which will be started today.  She has received metolazone as well today for sluggish diuresis.  Overall, getting somewhat better clinically, but still volume overloaded.    [name], MD 09/18/2015, 2:05 PM

## 2015-09-18 NOTE — Progress Notes (Signed)
Subjective: Patient seen and examined this morning. Patient reports shortness of breath is continuing to improve though still reports some leg swelling. She does not have leg swelling at baseline. Reports that normally this she did not have any dyspnea with exertion and had to stop to catch her breath this morning with walking the hall. Believes this is improving but not at her baseline.   Objective: Vital signs in last 24 hours: Filed Vitals:   09/17/15 1246 09/17/15 1607 09/17/15 2100 09/18/15 0622  BP: 108/50 111/39 126/54 107/44  Pulse: 62 63 63 60  Temp: 98.1 F (36.7 C)  98.3 F (36.8 C) 98.5 F (36.9 C)  TempSrc: Oral  Oral Oral  Resp: Height:      Weight:    199 lb 6.4 oz (90.447 kg)  SpO2: 98%  100% 96%   Weight change: -12.4 oz (-0.353 kg)  Intake/Output Summary (Last 24 hours) at 09/18/15 1610 Last data filed at 09/18/15 9604  Gross per 24 hour  Intake   1022 ml  Output   1950 ml  Net   -928 ml    PHYSICAL EXAM GENERAL- alert, co-operative, not in any distress. HEENT- Atraumatic, normocephalic, oral mucosa appears moist. +JVD. CARDIAC- RRR, 3/6 systolic murmur, no rubs or gallops. RESP- Moving equal volumes of air, no wheezes. Fine bibasilar crackles ABDOMEN- Soft, nontender, no guarding or rebound, bowel sounds present. NEURO-AAOx3 EXTREMITIES- 1+ pitting edema to ankles, LLE>RLE  SKIN- Warm, dry, No rash or lesion. PSYCH- Normal mood and affect, appropriate thought content and speech.  Medications: I have reviewed the patient's current medications. Scheduled Meds: . allopurinol  100 mg Oral Daily  . aspirin EC  81 mg Oral Daily  . carvedilol  12.5 mg Oral BID WC  . enoxaparin (LOVENOX) injection  30 mg Subcutaneous Q24H  . ferrous sulfate  325 mg Oral Q breakfast  . furosemide  120 mg Intravenous TID  . isosorbide-hydrALAZINE  1 tablet Oral TID  . potassium chloride  40 mEq Oral BID  . sodium chloride  3 mL Intravenous Q12H  . verapamil   180 mg Oral Daily   Continuous Infusions:  PRN Meds:.sodium chloride, acetaminophen, ondansetron (ZOFRAN) IV, senna-docusate, sodium chloride Assessment/Plan:  Diastolic CHF exacerbation: Weight stable, 198lbs-->199lbs. Net output yesterday despite 120 mg IV Lasix TID. Cr stable at 1.96. Will give a dose of metolazone today. Repeat ECHO today. Consult CHF for additional recommendations. Would benefit from follow up in HF clinic after discharge.  -Continue  IV lasix tid -Metolazone 5 mg once - PO potassium bid -Daily BMPs -Daily weights -Ins and outs -PT/OT  Acute renal failure superimposed on stage 3 chronic kidney disease: Cr stable at 1.96. Baseline creatine 1.3-1.5. Likely cardiorenal. Will monitor with diuresis. -BMP daily  HTN (hypertension): BP soft. On coreg and bidil at home. Also on verapamil. -Coreg 12.5 mg bid -Bidil 20-37.5 mg tid -Verapamil 180 mg daily  Anemia: Continue Iron sulfate pills  CAD, nonocclusive, s/p pacemaker: On ASA 81 mg. Normal coronaries in 10/2014.   DVT PPx: Lovenox  Dispo: Discharge pending clinical improvement   The patient does have a current PCP (Angela Adam, MD) and does need an [facility] hospital follow-up appointment after discharge.  The patient does not have transportation limitations that hinder transportation to clinic appointments.  .Services Needed at time of discharge: Y = Yes, Blank = No PT:   OT:   RN:   Equipment:   Other:  LOS: 7 days   Valentino Nose, MD IMTS PGY-1 [phone] 09/18/2015, 6:33 AM

## 2015-09-18 NOTE — Progress Notes (Signed)
  Echocardiogram 2D Echocardiogram has been performed.  Cathie Beams 09/18/2015, 12:47 PM

## 2015-09-18 NOTE — Consult Note (Signed)
Advanced Heart Failure Team Consult Note  Referring Physician: Dr Sharlene Motts Primary Physician/Cardiologist:  Dr Rosalee Kaufman in [place], [place]  Reason for Consultation: A/C Diastolic HF  HPI:    Anita Wiley is 74 y.o. AA female with pmh of Diastolic HF EF 55-60% with grade 3 DD. Normal RV echo 08/05/15, HTN, gout, and bradycardia s/p Medtronic dual chamber pacemaker in 2010.  Admitted 07/2015 for CAP and A/C diastolic CHF. Diuresed 5 lbs on IV lasix. Discharge weight was 191  She reported to [facility] 09/11/15 with weight gain of 13 lbs and worsening SOB x 4 days. She was admitted for IV diuresis.  Pertinent admission labs include K 3.6, Cr 2.13, BNP 1228.8 ( increase from 626 on previous admit.) CXR showed marked cardiomegaly and moderate chronic-appearing central vascular prominence. Repeat CXR 10/8 showed stable mild congestive HF.   HF team consulted as gradual increase in her lasix from 80 mg IV BID to 120 mg IV TID and pt still not returning to baseline weight. Cr remains elevated from previous perceived baseline but improved from admit.  She states that she had been taking all of her medications as directed and denies fluid or salt non compliance. Hasn't added salt "in years". Says she did absolutely nothing different and does not know where all the extra fluid came from.  She does feel much better than on admit.  She remains very edematous in her LEs and does not want to be discharged "too soon" and she just left the hospital around a month ago. Sleeps on 2 pillows chronically, orthopnea was worse on admit.  No CP, lightheadedness, or syncope.  Review of Systems: [y] = yes,  = no   General: Weight gain [y]; Weight loss ; Anorexia ; Fatigue ; Fever ; Chills ; Weakness   Cardiac: Chest pain/pressure ; Resting SOB ; Exertional SOB [y]; Orthopnea [y]; Pedal Edema [y]; Palpitations ; Syncope ; Presyncope ; Paroxysmal nocturnal dyspnea[ ]   Pulmonary: Cough [y];  Wheezing[ ] ; Hemoptysis[ ] ; Sputum ; Snoring   GI: Vomiting[ ] ; Dysphagia[ ] ; Melena[ ] ; Hematochezia ; Heartburn[ ] ; Abdominal pain ; Constipation ; Diarrhea ; BRBPR   GU: Hematuria[ ] ; Dysuria ; Nocturia[ ]   Vascular: Pain in legs with walking ; Pain in feet with lying flat ; Non-healing sores ; Stroke ; TIA ; Slurred speech ;  Neuro: Headaches[ ] ; Vertigo[ ] ; Seizures[ ] ; Paresthesias[ ] ;Blurred vision ; Diplopia ; Vision changes   Ortho/Skin: Arthritis [y]; Joint pain [y]; Muscle pain ; Joint swelling ; Back Pain ; Rash   Psych: Depression[ ] ; Anxiety[ ]   Heme: Bleeding problems ; Clotting disorders ; Anemia   Endocrine: Diabetes ; Thyroid dysfunction[ ]   Home Medications Prior to Admission medications   Medication Sig Start Date End Date Taking? Authorizing Provider  allopurinol (ZYLOPRIM) 100 MG tablet Take 100 mg by mouth daily.   Yes Historical Provider, MD  aspirin 81 MG tablet Take 81 mg by mouth daily.   Yes Historical Provider, MD  carvedilol (COREG) 12.5 MG tablet Take 1 tablet (12.5 mg total) by mouth 2 (two) times daily with a meal. 08/08/15  Yes Clydia Llano, MD  ferrous sulfate 325 (65 FE) MG tablet Take 325 mg by mouth daily with breakfast.   Yes Historical Provider, MD  furosemide (LASIX) 40 MG tablet Take 40 mg by mouth daily.   Yes Historical Provider, MD  isosorbide-hydrALAZINE (BIDIL) 20-37.5 MG tablet Take 1 tablet by mouth 3 (three) times daily.   Yes Historical Provider, MD  potassium chloride (K-DUR) 10 MEQ tablet Take 10 mEq by mouth daily.   Yes Historical Provider, MD  verapamil (COVERA HS) 180 MG (CO) 24 hr tablet Take 180 mg by mouth daily.    Yes Historical Provider, MD  azithromycin (ZITHROMAX) 500 MG tablet Take 1 tablet (500 mg total) by mouth daily. Patient not taking: Reported on 09/11/2015 08/08/15   Clydia Llano, MD  cefUROXime (CEFTIN) 500 MG tablet Take 1 tablet (500 mg total) by  mouth 2 (two) times daily with a meal. Patient not taking: Reported on 09/11/2015 08/08/15   Clydia Llano, MD    Past Medical History: Past Medical History  Diagnosis Date  . Hypertension   . Bradycardia     s/p pacemaker in 2010  . Chronic kidney disease   . Gout   . Pacemaker 2010  . Arthritis     OSTEO  . Gout     Past Surgical History: Past Surgical History  Procedure Laterality Date  . Pacemaker insertion      2010  . Knee surgery    . Abdominal hysterectomy      at age 49  . Insert / replace / remove pacemaker  2010  . Left heart catheterization with coronary angiogram N/A 11/06/2014    Procedure: LEFT HEART CATHETERIZATION WITH CORONARY ANGIOGRAM;  Surgeon: Lennette Bihari, MD;  Location: [facility] CATH LAB;  Service: Cardiovascular;  Laterality: N/A;    Family History: Family History  Problem Relation Age of Onset  . Hypertension Mother   . CAD Sister     unkown age    Social History: Social History   Social History  . Marital Status: Widowed    Spouse Name: N/A  . Number of Children: N/A  . Years of Education: N/A   Social History Main Topics  . Smoking status: Never Smoker   . Smokeless tobacco: Never Used  . Alcohol Use: No  . Drug Use: No  . Sexual Activity: Not Asked   Other Topics Concern  . None   Social History Narrative    Allergies:  Allergies  Allergen Reactions  . Codeine Itching    Objective:    Vital Signs:   Temp:  [98.1 F (36.7 C)-98.6 F (37 C)] 98.5 F (36.9 C) (10/11 0622) Pulse Rate:  [60-63] 60 (10/11 0622) Resp:  [18-20] 18 (10/11 0622) BP: (107-127)/(39-54) 107/44 mmHg (10/11 0622) SpO2:  [96 %-100 %] 96 % (10/11 0622) Weight:  [199 lb 6.4 oz (90.447 kg)] 199 lb 6.4 oz (90.447 kg) (10/11 0622) Last BM Date: 09/16/15  Weight change: Filed Weights   09/16/15 0750 09/17/15 0419 09/18/15 0622  Weight: 200 lb 2.8 oz (90.8 kg) 198 lb 6.6 oz (90 kg) 199 lb 6.4 oz (90.447 kg)    Intake/Output:   Intake/Output  Summary (Last 24 hours) at 09/18/15 1016 Last data filed at 09/18/15 0840  Gross per 24 hour  Intake    782 ml  Output   2000 ml  Net  -1218 ml     Physical Exam: General:  Well appearing. No resp difficulty HEENT: normal Neck: supple. JVP to jaw. Carotids 2+ bilat; no bruits. No lymphadenopathy or thyromegaly. Cor: PMI nondisplaced. Regular rate & rhythm. No rubs, gallops. 1/6 MR murmur. Lungs: Diminished  throughout Abdomen: obese, soft, NT, ND No hepatosplenomegaly. No bruits or masses. +BS Extremities: no cyanosis, clubbing, rash. 2-3+ edema into thighs. Neuro: alert & orientedx3, cranial nerves grossly intact. moves all 4 extremities w/o difficulty. Affect pleasant  Telemetry: A sensed v paced 60s  Labs: Basic Metabolic Panel:  Recent Labs Lab 09/14/15 0323 09/15/15 0751 09/15/15 0959 09/16/15 0900 09/17/15 0339 09/18/15 0518  NA 135 138  --  136 138 138  K 3.4* 3.4*  --  3.4* 4.2 4.2  CL 96* 101  --  99* 98* 97*  CO2 27 28  --  27 30 31   GLUCOSE 84 81  --  161* 85 85  BUN 51* 47*  --  42* 41* 46*  CREATININE 2.23* 1.93*  --  1.90* 1.87* 1.96*  CALCIUM 8.9 9.0  --  8.9 9.1 9.2  MG  --   --  2.2  --   --   --     Liver Function Tests: No results for input(s): AST, ALT, ALKPHOS, BILITOT, PROT, ALBUMIN in the last 168 hours. No results for input(s): LIPASE, AMYLASE in the last 168 hours. No results for input(s): AMMONIA in the last 168 hours.  CBC:  Recent Labs Lab 09/11/15 1726 09/11/15 2201 09/12/15 0348  WBC 5.9 6.6 5.5  NEUTROABS  --   --  2.6  HGB 9.0* 8.9* 8.7*  HCT 27.9* 26.8* 26.8*  MCV 88.3 88.2 86.7  PLT 157 149* 160    Cardiac Enzymes: No results for input(s): CKTOTAL, CKMB, CKMBINDEX, TROPONINI in the last 168 hours.  BNP: BNP (last 3 results)  Recent Labs  08/03/15 1845 09/11/15 1726  BNP 626.1* 1228.8*    ProBNP (last 3 results)  Recent Labs  11/06/14 1255  PROBNP 2232.0*     CBG: No results for input(s): GLUCAP in  the last 168 hours.  Coagulation Studies: No results for input(s): LABPROT, INR in the last 72 hours.  Other results: EKG: 09/11/2015 V paced 73 Imaging:  No results found.   Medications:     Current Medications: . allopurinol  100 mg Oral Daily  . aspirin EC  81 mg Oral Daily  . carvedilol  12.5 mg Oral BID WC  . enoxaparin (LOVENOX) injection  30 mg Subcutaneous Q24H  . ferrous sulfate  325 mg Oral Q breakfast  . furosemide  120 mg Intravenous TID  . isosorbide-hydrALAZINE  1 tablet Oral TID  . metolazone  5 mg Oral Once  . potassium chloride  40 mEq Oral BID  . sodium chloride  3 mL Intravenous Q12H  . verapamil  180 mg Oral Daily     Infusions:     Echo 08/05/15  LV EF: 55% -  60% Study Conclusions - Left ventricle: The cavity size was normal. There was moderate concentric hypertrophy. Systolic function was normal. The estimated ejection fraction was in the range of 55% to 60%. Wall motion was normal; there were no regional wall motion abnormalities. There was a reduced contribution of atrial contraction to ventricular filling, due to increased ventricular diastolic pressure or atrial contractile dysfunction. Doppler parameters are consistent with a reversible restrictive pattern, indicative of decreased left ventricular diastolic compliance and/or increased left atrial pressure (grade 3 diastolic dysfunction). Doppler parameters are consistent with high ventricular filling pressure. - Aortic valve: Trileaflet; mildly thickened leaflets. There was trivial regurgitation. - Mitral valve: There was mild to moderate regurgitation. - Left atrium: The atrium was moderately dilated. - Tricuspid valve: There was moderate-severe  regurgitation. - Pulmonary arteries: PA peak pressure: 66 mm Hg (S).  Assessment/Plan   1. Acute on chronic diastolic HF, LVEF 55-60% with Grade 3 DD 07/2015 - Remains significantly volume overloaded with edema into  thighs and elevated JVP.  ~10 lbs up from previous discharge weight. - Will transition to Lasix gtt today.  - Continue daily weights and strict I/Os - Repeat Echo pending 2. AKI on CKD - Monitor closely with diuresis. - 2.1 on admit.  As low as 1.34 to 1.5 in 07/2015. 1.96 today 3. HTN - Stable. Soft, would not add/increase any meds right now. 4. CAD - Normal coronaries in 10/2014  Dispo: Will likely need 2-3 more days of IV diuresis, depending on response to addition of metolazone. Will schedule follow up with HF clinic on d/c.   Length of Stay: 7  [name] PA-C 09/18/2015, 10:16 AM  Advanced Heart Failure Team Pager [phone] (M-F; 7a - 4p)  Please contact [facility] for night-coverage after hours (4p -7a ) and weekends on [email or web address]  Patient seen with PA, agree with the above note. 1. Acute on chronic diastolic CHF: EF 45-40% with moderate to severe TR, PASP 66 mmHg.  Normal coronaries on 11/15 cath.  Patient is volume overloaded still on exam. Diuresis has been sluggish.  Creatinine relatively stable about 1.9.   - She got Lasix 80 mg IV x 1 + metolazone 5 x 1 today.  Will stop further Lasix boluses and use Lasix gtt at 10 mg/hr for now, may increase tomorrow if creatinine stable.   2. CKD: Stage IV.  Stable with diuresis, continue to follow closely.  3. S/p Medtronic dual chamber PPM: Functioning normally.   [name] 09/18/2015 1:31 PM

## 2015-09-19 ENCOUNTER — Encounter (HOSPITAL_COMMUNITY): Payer: Self-pay | Admitting: Cardiology

## 2015-09-19 ENCOUNTER — Encounter (HOSPITAL_COMMUNITY)
Admission: EM | Disposition: A | Discharge status: Home Health | Payer: Medicare Other | Source: Home / Self Care | Attending: Internal Medicine

## 2015-09-19 DIAGNOSIS — I509 Heart failure, unspecified: Secondary | ICD-10-CM

## 2015-09-19 DIAGNOSIS — N184 Chronic kidney disease, stage 4 (severe): Secondary | ICD-10-CM

## 2015-09-19 HISTORY — PX: CARDIAC CATHETERIZATION: SHX172

## 2015-09-19 LAB — CBC
HCT: 28 % — ABNORMAL LOW (ref 36.0–46.0)
HCT: 26 % — ABNORMAL LOW (ref 36.0–46.0)
Hemoglobin: 9 g/dL — ABNORMAL LOW (ref 12.0–15.0)
Hemoglobin: 8.3 g/dL — ABNORMAL LOW (ref 12.0–15.0)
MCH: 28.3 pg (ref 26.0–34.0)
MCH: 28.4 pg (ref 26.0–34.0)
MCHC: 32.1 g/dL (ref 30.0–36.0)
MCHC: 31.9 g/dL (ref 30.0–36.0)
MCV: 88.1 fL (ref 78.0–100.0)
MCV: 89 fL (ref 78.0–100.0)
PLATELETS: 163 10*3/uL (ref 150–400)
PLATELETS: 144 10*3/uL — AB (ref 150–400)
RBC: 3.18 MIL/uL — AB (ref 3.87–5.11)
RBC: 2.92 MIL/uL — AB (ref 3.87–5.11)
RDW: 15 % (ref 11.5–15.5)
RDW: 15 % (ref 11.5–15.5)
WBC: 5.9 10*3/uL (ref 4.0–10.5)
WBC: 6.3 10*3/uL (ref 4.0–10.5)

## 2015-09-19 LAB — CREATININE, SERUM
CREATININE: 2.14 mg/dL — AB (ref 0.44–1.00)
GFR calc Af Amer: 25 mL/min — ABNORMAL LOW (ref >60)
GFR calc non Af Amer: 21 mL/min — ABNORMAL LOW (ref >60)

## 2015-09-19 LAB — POCT I-STAT 3, VENOUS BLOOD GAS (G3P V)
ACID-BASE EXCESS: 7 mmol/L — AB (ref 0.0–2.0)
Acid-Base Excess: 6 mmol/L — ABNORMAL HIGH (ref 0.0–2.0)
BICARBONATE: 33.2 meq/L — AB (ref 20.0–24.0)
Bicarbonate: 32.8 mEq/L — ABNORMAL HIGH (ref 20.0–24.0)
O2 SAT: 64 %
O2 Saturation: 67 %
PCO2 VEN: 56.6 mmHg — AB (ref 45.0–50.0)
PO2 VEN: 36 mmHg (ref 30.0–45.0)
PO2 VEN: 37 mmHg (ref 30.0–45.0)
TCO2: 34 mmol/L (ref 0–100)
TCO2: 35 mmol/L (ref 0–100)
pCO2, Ven: 58.4 mmHg — ABNORMAL HIGH (ref 45.0–50.0)
pH, Ven: 7.363 — ABNORMAL HIGH (ref 7.250–7.300)
pH, Ven: 7.371 — ABNORMAL HIGH (ref 7.250–7.300)

## 2015-09-19 LAB — BASIC METABOLIC PANEL
Anion gap: 13 (ref 5–15)
BUN: 48 mg/dL — ABNORMAL HIGH (ref 6–20)
CO2: 30 mmol/L (ref 22–32)
CREATININE: 2.16 mg/dL — AB (ref 0.44–1.00)
Calcium: 9.3 mg/dL (ref 8.9–10.3)
Chloride: 94 mmol/L — ABNORMAL LOW (ref 101–111)
GFR calc Af Amer: 24 mL/min — ABNORMAL LOW (ref >60)
GFR calc non Af Amer: 21 mL/min — ABNORMAL LOW (ref >60)
Glucose, Bld: 97 mg/dL (ref 65–99)
POTASSIUM: 4.3 mmol/L (ref 3.5–5.1)
Sodium: 137 mmol/L (ref 135–145)

## 2015-09-19 LAB — PROTIME-INR
INR: 1.27 (ref 0.00–1.49)
PROTHROMBIN TIME: 16 s — AB (ref 11.6–15.2)

## 2015-09-19 SURGERY — RIGHT HEART CATH

## 2015-09-19 MED ORDER — LIDOCAINE HCL (PF) 1 % IJ SOLN
INTRAMUSCULAR | Status: AC
  Filled 2015-09-19: qty 30

## 2015-09-19 MED ORDER — ACETAMINOPHEN 325 MG PO TABS: 650.0000 mg | ORAL_TABLET | ORAL | Status: DC | PRN

## 2015-09-19 MED ORDER — SODIUM CHLORIDE 0.9 % IV SOLN: 250.0000 mL | INTRAVENOUS | Status: DC | PRN

## 2015-09-19 MED ORDER — SODIUM CHLORIDE 0.9 % IJ SOLN: 3.0000 mL | INTRAMUSCULAR | Status: DC | PRN

## 2015-09-19 MED ORDER — HEPARIN SODIUM (PORCINE) 5000 UNIT/ML IJ SOLN: 5000.0000 [IU] | Freq: Three times a day (TID) | INTRAMUSCULAR | Status: DC

## 2015-09-19 MED ORDER — FENTANYL CITRATE (PF) 100 MCG/2ML IJ SOLN
INTRAMUSCULAR | Status: DC | PRN
  Administered 2015-09-19: 25 ug via INTRAVENOUS

## 2015-09-19 MED ORDER — FUROSEMIDE 10 MG/ML IJ SOLN
10.0000 mg/h | INTRAVENOUS | Status: DC
  Administered 2015-09-19 – 2015-09-21 (×3): 15 mg/h via INTRAVENOUS
  Filled 2015-09-19 (×6): qty 25

## 2015-09-19 MED ORDER — HEPARIN (PORCINE) IN NACL 2-0.9 UNIT/ML-% IJ SOLN
INTRAMUSCULAR | Status: AC
  Filled 2015-09-19: qty 500

## 2015-09-19 MED ORDER — SODIUM CHLORIDE 0.9 % IV SOLN
INTRAVENOUS | Status: DC
Start: 2015-09-20 — End: 2015-09-22

## 2015-09-19 MED ORDER — MIDAZOLAM HCL 2 MG/2ML IJ SOLN
INTRAMUSCULAR | Status: DC | PRN
  Administered 2015-09-19: 1 mg via INTRAVENOUS

## 2015-09-19 MED ORDER — ONDANSETRON HCL 4 MG/2ML IJ SOLN: 4.0000 mg | Freq: Four times a day (QID) | INTRAMUSCULAR | Status: DC | PRN

## 2015-09-19 MED ORDER — SODIUM CHLORIDE 0.9 % IJ SOLN
3.0000 mL | Freq: Two times a day (BID) | INTRAMUSCULAR | Status: DC
  Administered 2015-09-19 – 2015-09-21 (×3): 3 mL via INTRAVENOUS

## 2015-09-19 MED ORDER — SODIUM CHLORIDE 0.9 % IJ SOLN
3.0000 mL | Freq: Two times a day (BID) | INTRAMUSCULAR | Status: DC
  Administered 2015-09-19: 3 mL via INTRAVENOUS

## 2015-09-19 MED ORDER — MIDAZOLAM HCL 2 MG/2ML IJ SOLN
INTRAMUSCULAR | Status: AC
  Filled 2015-09-19: qty 4

## 2015-09-19 MED ORDER — FENTANYL CITRATE (PF) 100 MCG/2ML IJ SOLN
INTRAMUSCULAR | Status: AC
  Filled 2015-09-19: qty 4

## 2015-09-19 MED ORDER — SODIUM CHLORIDE 0.9 % IJ SOLN
3.0000 mL | Freq: Two times a day (BID) | INTRAMUSCULAR | Status: DC
  Administered 2015-09-19 – 2015-09-22 (×2): 3 mL via INTRAVENOUS

## 2015-09-19 SURGICAL SUPPLY — 8 items
CATH SWAN GANZ 7F STRAIGHT (CATHETERS) ×3 IMPLANT
KIT HEART LEFT (KITS) ×3 IMPLANT
KIT HEART RIGHT NAMIC (KITS) ×3 IMPLANT
PACK CARDIAC CATHETERIZATION (CUSTOM PROCEDURE TRAY) ×3 IMPLANT
PROTECTION STATION PRESSURIZED (MISCELLANEOUS) ×3
SHEATH PINNACLE 7F 10CM (SHEATH) ×3 IMPLANT
STATION PROTECTION PRESSURIZED (MISCELLANEOUS) ×1 IMPLANT
TRANSDUCER W/STOPCOCK (MISCELLANEOUS) ×3 IMPLANT

## 2015-09-19 NOTE — H&P (View-Only) (Signed)
Advanced Heart Failure Rounding Note  Primary Physician/Cardiologist: Dr Syed Ahmed in [place], [place] HF: Anita Wiley  Subjective:    No complaints today. Breathing is "alright". When asked about fluid intake she says " I feel like I'm drinking more than I usually do."  Out 1 L on lasix gtt at 10 mg/hr and 5 mg metolazone.  Weight stable and done standing  Objective:   Weight Range: 199 lb 3.2 oz (90.357 kg) Body mass index is 33.15 kg/(m^2).   Vital Signs:   Temp:  [98.1 F (36.7 C)-98.9 F (37.2 C)] 98.6 F (37 C) (10/12 0344) Pulse Rate:  [60-71] 71 (10/12 0344) Resp:  [18] 18 (10/12 0344) BP: (90-115)/(37-49) 112/49 mmHg (10/12 0344) SpO2:  [95 %-100 %] 95 % (10/12 0344) Weight:  [199 lb 3.2 oz (90.357 kg)] 199 lb 3.2 oz (90.357 kg) (10/12 0344) Last BM Date: 09/16/15  Weight change: Filed Weights   09/17/15 0419 09/18/15 0622 09/19/15 0344  Weight: 198 lb 6.6 oz (90 kg) 199 lb 6.4 oz (90.447 kg) 199 lb 3.2 oz (90.357 kg)    Intake/Output:   Intake/Output Summary (Last 24 hours) at 09/19/15 0837 Last data filed at 09/19/15 0754  Gross per 24 hour  Intake    850 ml  Output   2700 ml  Net  -1850 ml     Physical Exam: General: Elderly, NAD.  HEENT: normal Neck: supple. JVP 12. Carotids 2+ bilat; no bruits. No lymphadenopathy or thyromegaly. Cor: PMI normal. RRR. No rubs, gallops. 2/6 HSM LLSB/apex. Lungs: Diminished bases, improved. Abdomen: obese, soft, nontender, nondistended.  No HSM. No bruits or masses. Good bowel sounds. Extremities: no cyanosis, clubbing, rash. 2+ pretibial edema. Neuro: alert & orientedx3, cranial nerves grossly intact. moves all 4 extremities w/o difficulty. Affect pleasant   Telemetry: a sensed v paced 70s  Labs: CBC No results for input(s): WBC, NEUTROABS, HGB, HCT, MCV, PLT in the last 72 hours. Basic Metabolic Panel  Recent Labs  09/18/15 0518 09/19/15 0340  NA 138 137  K 4.2 4.3  CL 97* 94*  CO2 31 30  GLUCOSE 85 97   BUN 46* 48*  CREATININE 1.96* 2.16*  CALCIUM 9.2 9.3   Liver Function Tests No results for input(s): AST, ALT, ALKPHOS, BILITOT, PROT, ALBUMIN in the last 72 hours. No results for input(s): LIPASE, AMYLASE in the last 72 hours. Cardiac Enzymes No results for input(s): CKTOTAL, CKMB, CKMBINDEX, TROPONINI in the last 72 hours.  BNP: BNP (last 3 results)  Recent Labs  08/03/15 1845 09/11/15 1726  BNP 626.1* 1228.8*    ProBNP (last 3 results)  Recent Labs  11/06/14 1255  PROBNP 2232.0*     D-Dimer No results for input(s): DDIMER in the last 72 hours. Hemoglobin A1C No results for input(s): HGBA1C in the last 72 hours. Fasting Lipid Panel No results for input(s): CHOL, HDL, LDLCALC, TRIG, CHOLHDL, LDLDIRECT in the last 72 hours. Thyroid Function Tests No results for input(s): TSH, T4TOTAL, T3FREE, THYROIDAB in the last 72 hours.  Invalid input(s): FREET3  Other results:     Imaging/Studies:   No results found.  Latest Echo  Latest Cath   Medications:     Scheduled Medications: . allopurinol  100 mg Oral Daily  . aspirin EC  81 mg Oral Daily  . carvedilol  12.5 mg Oral BID WC  . enoxaparin (LOVENOX) injection  30 mg Subcutaneous Q24H  . ferrous sulfate  325 mg Oral Q breakfast  . isosorbide-hydrALAZINE  1 tablet   Oral TID  . potassium chloride  40 mEq Oral BID  . sodium chloride  3 mL Intravenous Q12H  . verapamil  180 mg Oral Daily     Infusions: . furosemide (LASIX) infusion 10 mg/hr (09/18/15 1410)     PRN Medications:  sodium chloride, acetaminophen, ondansetron (ZOFRAN) IV, senna-docusate, sodium chloride   Assessment/Plan  Anita Wiley is 74 y.o. AA female with pmh of Diastolic HF EF 55-60% with grade 3 DD. Normal RV echo 08/05/15, HTN, gout, and bradycardia s/p Medtronic dual chamber pacemaker in 2010. HF team was consulted due to poor diuresis with increasing diuretics.  1. Acute on chronic diastolic HF, LVEF 60-65% with Grade 2 DD  09/18/2015 - Remains volume overloaded with edema into thighs and elevated JVP. Still about 10 lbs up from previous discharge weight. - Transitioned to Lasix gtt 09/18/15. Will continue for today and evaluate tomorrow with ^ Cr. Hold metolazone. - Continue daily weights and strict I/Os - Repeat Echo with EF 60-65% and grade 2 DD, elevated PA pressure @ 73 mm/Hg. 2. AKI on CKD - Monitor closely with diuresis. - 2.1 on admit. As low as 1.34 to 1.5 in 07/2015. 2.16 today 3. HTN - Stable. 4. CAD - Normal coronaries in 10/2014  Length of Stay: 8  Anita FreerMichael Andrew Tillery PA-C 09/19/2015, 8:37 AM  Advanced Heart Failure Team Pager [phone] (M-F; 7a - 4p)  Please contact [facility] for night-coverage after hours (4p -7a ) and weekends on [email or web address]  Patient seen with PA, agree with the above note.   1. Acute on chronic diastolic CHF: EF 45-40%60-65% with moderate LVH, moderate TR, PASP 73 mmHg. Normal coronaries on 11/15 cath. She remains volume overloaded. Some diuresis yesterday but not marked.  Creatinine up to 2.1.  - Increase Lasix gtt to 15 mg/hr.   - I am going to do a RHC to get an idea of residual volume and also assess pulmonary hypertension => ?primarily pulmonary arterial HTN versus pulmonary venous HTN.  Will plan for today.   2. CKD: Stage IV. Creatinine mildly higher today, watch closely with diuresis.  If we can lower her renal venous pressure, creatinine may come down some.   3. S/p Medtronic dual chamber PPM: Functioning normally.   Anita Wiley 09/19/2015 9:31 AM

## 2015-09-19 NOTE — Progress Notes (Signed)
Advanced Heart Failure Rounding Note  Primary Physician/Cardiologist: Dr Rosalee Kaufman in [place], [place] HF: Anita Wiley  Subjective:    No complaints today. Breathing is "alright". When asked about fluid intake she says " I feel like I'm drinking more than I usually do."  Out 1 L on lasix gtt at 10 mg/hr and 5 mg metolazone.  Weight stable and done standing  Objective:   Weight Range: 199 lb 3.2 oz (90.357 kg) Body mass index is 33.15 kg/(m^2).   Vital Signs:   Temp:  [98.1 F (36.7 C)-98.9 F (37.2 C)] 98.6 F (37 C) (10/12 0344) Pulse Rate:  [60-71] 71 (10/12 0344) Resp:  [18] 18 (10/12 0344) BP: (90-115)/(37-49) 112/49 mmHg (10/12 0344) SpO2:  [95 %-100 %] 95 % (10/12 0344) Weight:  [199 lb 3.2 oz (90.357 kg)] 199 lb 3.2 oz (90.357 kg) (10/12 0344) Last BM Date: 09/16/15  Weight change: Filed Weights   09/17/15 0419 09/18/15 0622 09/19/15 0344  Weight: 198 lb 6.6 oz (90 kg) 199 lb 6.4 oz (90.447 kg) 199 lb 3.2 oz (90.357 kg)    Intake/Output:   Intake/Output Summary (Last 24 hours) at 09/19/15 0837 Last data filed at 09/19/15 0754  Gross per 24 hour  Intake    850 ml  Output   2700 ml  Net  -1850 ml     Physical Exam: General: Elderly, NAD.  HEENT: normal Neck: supple. JVP 12. Carotids 2+ bilat; no bruits. No lymphadenopathy or thyromegaly. Cor: PMI normal. RRR. No rubs, gallops. 2/6 HSM LLSB/apex. Lungs: Diminished bases, improved. Abdomen: obese, soft, nontender, nondistended.  No HSM. No bruits or masses. Good bowel sounds. Extremities: no cyanosis, clubbing, rash. 2+ pretibial edema. Neuro: alert & orientedx3, cranial nerves grossly intact. moves all 4 extremities w/o difficulty. Affect pleasant   Telemetry: a sensed v paced 70s  Labs: CBC No results for input(s): WBC, NEUTROABS, HGB, HCT, MCV, PLT in the last 72 hours. Basic Metabolic Panel  Recent Labs  09/18/15 0518 09/19/15 0340  NA 138 137  K 4.2 4.3  CL 97* 94*  CO2 31 30  GLUCOSE 85 97   BUN 46* 48*  CREATININE 1.96* 2.16*  CALCIUM 9.2 9.3   Liver Function Tests No results for input(s): AST, ALT, ALKPHOS, BILITOT, PROT, ALBUMIN in the last 72 hours. No results for input(s): LIPASE, AMYLASE in the last 72 hours. Cardiac Enzymes No results for input(s): CKTOTAL, CKMB, CKMBINDEX, TROPONINI in the last 72 hours.  BNP: BNP (last 3 results)  Recent Labs  08/03/15 1845 09/11/15 1726  BNP 626.1* 1228.8*    ProBNP (last 3 results)  Recent Labs  11/06/14 1255  PROBNP 2232.0*     D-Dimer No results for input(s): DDIMER in the last 72 hours. Hemoglobin A1C No results for input(s): HGBA1C in the last 72 hours. Fasting Lipid Panel No results for input(s): CHOL, HDL, LDLCALC, TRIG, CHOLHDL, LDLDIRECT in the last 72 hours. Thyroid Function Tests No results for input(s): TSH, T4TOTAL, T3FREE, THYROIDAB in the last 72 hours.  Invalid input(s): FREET3  Other results:     Imaging/Studies:   No results found.  Latest Echo  Latest Cath   Medications:     Scheduled Medications: . allopurinol  100 mg Oral Daily  . aspirin EC  81 mg Oral Daily  . carvedilol  12.5 mg Oral BID WC  . enoxaparin (LOVENOX) injection  30 mg Subcutaneous Q24H  . ferrous sulfate  325 mg Oral Q breakfast  . isosorbide-hydrALAZINE  1 tablet  Oral TID  . potassium chloride  40 mEq Oral BID  . sodium chloride  3 mL Intravenous Q12H  . verapamil  180 mg Oral Daily     Infusions: . furosemide (LASIX) infusion 10 mg/hr (09/18/15 1410)     PRN Medications:  sodium chloride, acetaminophen, ondansetron (ZOFRAN) IV, senna-docusate, sodium chloride   Assessment/Plan  Anita HaagensenVenna Wiley is 74 y.o. AA female with pmh of Diastolic HF EF 55-60% with grade 3 DD. Normal RV echo 08/05/15, HTN, gout, and bradycardia s/p Medtronic dual chamber pacemaker in 2010. HF team was consulted due to poor diuresis with increasing diuretics.  1. Acute on chronic diastolic HF, LVEF 60-65% with Grade 2 DD  09/18/2015 - Remains volume overloaded with edema into thighs and elevated JVP. Still about 10 lbs up from previous discharge weight. - Transitioned to Lasix gtt 09/18/15. Will continue for today and evaluate tomorrow with ^ Cr. Hold metolazone. - Continue daily weights and strict I/Os - Repeat Echo with EF 60-65% and grade 2 DD, elevated PA pressure @ 73 mm/Hg. 2. AKI on CKD - Monitor closely with diuresis. - 2.1 on admit. As low as 1.34 to 1.5 in 07/2015. 2.16 today 3. HTN - Stable. 4. CAD - Normal coronaries in 10/2014  Length of Stay: 8  [name] 09/19/2015, 8:37 AM  Advanced Heart Failure Team Pager [phone] (M-F; 7a - 4p)  Please contact [facility] for night-coverage after hours (4p -7a ) and weekends on [email or web address]  Patient seen with PA, agree with the above note.   1. Acute on chronic diastolic CHF: EF 45-40%60-65% with moderate LVH, moderate TR, PASP 73 mmHg. Normal coronaries on 11/15 cath. She remains volume overloaded. Some diuresis yesterday but not marked.  Creatinine up to 2.1.  - Increase Lasix gtt to 15 mg/hr.   - I am going to do a RHC to get an idea of residual volume and also assess pulmonary hypertension => ?primarily pulmonary arterial HTN versus pulmonary venous HTN.  Will plan for today.   2. CKD: Stage IV. Creatinine mildly higher today, watch closely with diuresis.  If we can lower her renal venous pressure, creatinine may come down some.   3. S/p Medtronic dual chamber PPM: Functioning normally.   Marca AnconaDalton Anjali Manzella 09/19/2015 9:31 AM

## 2015-09-19 NOTE — Interval H&P Note (Signed)
History and Physical Interval Note:  09/19/2015 1:34 PM  [name]  has presented today for surgery, with the diagnosis of hf  The various methods of treatment have been discussed with the patient and family. After consideration of risks, benefits and other options for treatment, the patient has consented to  Procedure(s): Right Heart Cath (N/A) as a surgical intervention .  The patient's history has been reviewed, patient examined, no change in status, stable for surgery.  I have reviewed the patient's chart and labs.  Questions were answered to the patient's satisfaction.     Rhegan Trunnell [employer]

## 2015-09-19 NOTE — Progress Notes (Signed)
  Date: 09/19/2015  Patient name: Mady HaagensenVenna Galeas  Medical record number: [id number]  Date of birth: 01/22/1941   This patient's plan of care was discussed with the house staff. Please see Dr. Bonney RousselBoswell's note for complete details. I concur with his findings.  Up titrate lasix drip.  Hold Bidil today given mild hypotension with diuresis.  Strict I/O, daily weight.  We are making some progress, however diuresis is still sluggish.  RHC today by Cardiology.     Inez CatalinaEmily B Jahir Halt, MD 09/19/2015, 11:17 AM

## 2015-09-19 NOTE — Progress Notes (Signed)
Subjective: Patient seen and examined this morning. Reports no change in her symptoms. Still dyspneic with exertion. Leg edema persisting. She is upset this morning with the lasix drip. Says she feels that every time she gets back from the bathroom she has to go again. Reported to HF team she feels like she is drinking more than usual. I/O have been fairly consistent a little over a liter intake daily.   Objective: Vital signs in last 24 hours: Filed Vitals:   09/18/15 1106 09/18/15 1500 09/18/15 1950 09/19/15 0344  BP: 115/42 90/40 92/37  112/49  Pulse: 67 60 65 71  Temp:  98.1 F (36.7 C) 98.9 F (37.2 C) 98.6 F (37 C)  TempSrc:  Oral Oral Oral  Resp:  18 18 18   Height:      Weight:    199 lb 3.2 oz (90.357 kg)  SpO2:  96% 100% 95%   Weight change: -3.2 oz (-0.091 kg)  Intake/Output Summary (Last 24 hours) at 09/19/15 0726 Last data filed at 09/19/15 0600  Gross per 24 hour  Intake   1210 ml  Output   2300 ml  Net  -1090 ml    PHYSICAL EXAM GENERAL- alert, co-operative, not in any distress. HEENT- Atraumatic, normocephalic, oral mucosa appears moist. +JVD. CARDIAC- RRR, 3/6 systolic murmur, no rubs or gallops. RESP- Moving equal volumes of air, no wheezes. Fine bibasilar crackles ABDOMEN- Soft, nontender, no guarding or rebound, bowel sounds present. NEURO-AAOx3 EXTREMITIES- 2+ pitting edema mid shin, LLE>RLE  SKIN- Warm, dry, No rash or lesion. PSYCH- Normal mood and affect, appropriate thought content and speech.  Medications: I have reviewed the patient's current medications. Scheduled Meds: . allopurinol  100 mg Oral Daily  . aspirin EC  81 mg Oral Daily  . carvedilol  12.5 mg Oral BID WC  . enoxaparin (LOVENOX) injection  30 mg Subcutaneous Q24H  . ferrous sulfate  325 mg Oral Q breakfast  . isosorbide-hydrALAZINE  1 tablet Oral TID  . potassium chloride  40 mEq Oral BID  . sodium chloride  3 mL Intravenous Q12H  . verapamil  180 mg Oral Daily    Continuous Infusions: . furosemide (LASIX) infusion 10 mg/hr (09/18/15 1410)   PRN Meds:.sodium chloride, acetaminophen, ondansetron (ZOFRAN) IV, senna-docusate, sodium chloride Assessment/Plan:  Diastolic CHF exacerbation: Weight stable at 199lbs. Net 1.1 L output yesterday on lasix ggt 10 mg/hr and 5 mg metolazone. Cr up, 1.96-->2.16. Repeat ECHO yesterday with no change from previous. EF 60-65% with moderate LVH, moderate TR, PASP 73 mmHg. Maintained >95% O2 sats while walking yesterday. Will go for RHC later today per cards. Increasing lasix ggt per cards. -lasix ggt to 15 mg/hr -40mEq PO potassium bid -Daily BMPs -Daily weights -Ins and outs -PT/OT -RHC today  Acute renal failure superimposed on stage 3 chronic kidney disease: Cr increased, 2.16. Baseline creatine 1.3-1.5. Likely cardiorenal.  -BMP daily  HTN (hypertension): BP soft. Will hold bidil today with the aggressive diuresis and low BPs. On coreg and bidil at home. Also on verapamil. -Coreg 12.5 mg bid -D/C Bidil 20-37.5 mg tid -Verapamil 180 mg daily  Anemia: Continue Iron sulfate pills  CAD, nonocclusive, s/p pacemaker: On ASA 81 mg. Normal coronaries in 10/2014.   DVT PPx: Lovenox  FEN: NPO for RHC  Dispo: Discharge pending clinical improvement   The patient does have a current PCP (Angela AdamSyed A Ahmed, MD) and [facility] after [facility].  The patient does not have transportation limitations  that hinder transportation to clinic appointments.  .Services Needed at time of discharge: Y = Yes, Blank = No PT:   OT:   RN:   Equipment:   Other:     LOS: 8 days   Valentino Nose, MD I[facility] PGY-1 [phone] 09/19/2015, 7:26 AM

## 2015-09-20 ENCOUNTER — Inpatient Hospital Stay (HOSPITAL_COMMUNITY): Payer: Medicare Other

## 2015-09-20 LAB — BASIC METABOLIC PANEL
Anion gap: 16 — ABNORMAL HIGH (ref 5–15)
BUN: 49 mg/dL — AB (ref 6–20)
CHLORIDE: 91 mmol/L — AB (ref 101–111)
CO2: 32 mmol/L (ref 22–32)
Calcium: 9.5 mg/dL (ref 8.9–10.3)
Creatinine, Ser: 2.15 mg/dL — ABNORMAL HIGH (ref 0.44–1.00)
GFR calc Af Amer: 24 mL/min — ABNORMAL LOW (ref >60)
GFR calc non Af Amer: 21 mL/min — ABNORMAL LOW (ref >60)
GLUCOSE: 88 mg/dL (ref 65–99)
POTASSIUM: 4.2 mmol/L (ref 3.5–5.1)
SODIUM: 139 mmol/L (ref 135–145)

## 2015-09-20 LAB — CBC
HEMATOCRIT: 27.7 % — AB (ref 36.0–46.0)
Hemoglobin: 9.1 g/dL — ABNORMAL LOW (ref 12.0–15.0)
MCH: 29.3 pg (ref 26.0–34.0)
MCHC: 32.9 g/dL (ref 30.0–36.0)
MCV: 89.1 fL (ref 78.0–100.0)
Platelets: 155 10*3/uL (ref 150–400)
RBC: 3.11 MIL/uL — ABNORMAL LOW (ref 3.87–5.11)
RDW: 15 % (ref 11.5–15.5)
WBC: 6.1 10*3/uL (ref 4.0–10.5)

## 2015-09-20 MED FILL — Lidocaine HCl Local Preservative Free (PF) Inj 1%: INTRAMUSCULAR | Qty: 30 | Status: AC

## 2015-09-20 NOTE — Progress Notes (Signed)
  Date: 09/20/2015  Patient name: Anita Wiley  Medical record number: [id number]  Date of birth: 01/11/1941   This patient's plan of care was discussed with the house staff. Please see Dr. Bonney RousselBoswell's note for complete details. I concur with his findings.   Inez CatalinaEmily B Daisy Lites, MD 09/20/2015, 8:24 PM

## 2015-09-20 NOTE — Progress Notes (Signed)
Subjective: Patient seen and evaluated this morning. She reports feeling much better today. Walked the halls this morning and had minimal shortness of breath. No dyspnea at rest.   Objective: Vital signs in last 24 hours: Filed Vitals:   09/19/15 1716 09/19/15 2156 09/20/15 0518 09/20/15 1127  BP: 115/55 106/43 123/53 100/39  Pulse: 61 63 65 63  Temp:  98.3 F (36.8 C) 98.2 F (36.8 C) 98.1 F (36.7 C)  TempSrc:  Oral Oral Oral  Resp:  Height:      Weight:   195 lb 5.2 oz (88.6 kg)   SpO2:  94% 94% 100%   Weight change: -3 lb 14 oz (-1.757 kg)  Intake/Output Summary (Last 24 hours) at 09/20/15 1601 Last data filed at 09/20/15 1532  Gross per 24 hour  Intake 2232.9 ml  Output   3200 ml  Net -967.1 ml   PHYSICAL EXAM GENERAL- alert, co-operative, not in any distress. HEENT- Atraumatic, normocephalic. +JVD. CARDIAC- RRR, 3/6 systolic murmur, no rubs or gallops. RESP- Moving equal volumes of air, no wheezes. ABDOMEN- Soft, nontender, no guarding or rebound, bowel sounds present. NEURO-AAOx3 EXTREMITIES- 2+ pitting edema mid shin, LLE>RLE  SKIN- Warm, dry, No rash or lesion. PSYCH- Normal mood and affect, appropriate thought content and speech  Medications: I have reviewed the patient's current medications. Scheduled Meds: . allopurinol  100 mg Oral Daily  . aspirin EC  81 mg Oral Daily  . carvedilol  12.5 mg Oral BID WC  . enoxaparin (LOVENOX) injection  30 mg Subcutaneous Q24H  . ferrous sulfate  325 mg Oral Q breakfast  . potassium chloride  40 mEq Oral BID  . sodium chloride  3 mL Intravenous Q12H  . sodium chloride  3 mL Intravenous Q12H  . sodium chloride  3 mL Intravenous Q12H  . sodium chloride  3 mL Intravenous Q12H  . verapamil  180 mg Oral Daily   Continuous Infusions: . sodium chloride    . 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 Diureses well on lasix ggt 15 mL/hr. Net 1.5 L out yesterday. Weight improved 199lb --> 195 lbs. Edema in legs persists. Cr stable at 2.15. CBC stable.  -Continue lasix ggt 15 mL/hr per cards - PO potassium bid -Daily BMPs -Daily weights -Ins and outs  Acute renal failure superimposed on stage 3 chronic kidney disease: Cr increased, 2.15. Baseline creatine 1.3-1.5. Likely cardiorenal.  -BMP daily  HTN (hypertension): BP remains soft. Holding bidil with the aggressive diuresis and low BPs. On coreg and bidil at home. Also on verapamil. -Coreg 12.5 mg bid -D/C Bidil 20-37.5 mg tid -Verapamil 180 mg daily  Anemia: Continue Iron sulfate pills  CAD, nonocclusive, s/p pacemaker: On ASA 81 mg. Normal coronaries in 10/2014.   DVT PPx: Lovenox  FEN: Heart Healthy  Dispo: Disposition is deferred at this time, awaiting improvement of current medical problems.  Anticipated discharge in approximately 1-2 day(s).   The patient does have a current PCP (Angela Adam, MD) and does need an [facility] hospital follow-up appointment after discharge.  The patient does not have transportation limitations that hinder transportation to clinic appointments.  .Services Needed at time of discharge:  Y = Yes, Blank = No PT:   OT:   RN:   Equipment:   Other:     LOS: 9 days   Valentino NoseNathan Jessice Madill, MD [facility] PGY-1 [phone] 09/20/2015, 4:01 PM

## 2015-09-20 NOTE — Progress Notes (Addendum)
Advanced Heart Failure Rounding Note  Primary Physician/Cardiologist: Dr Rosalee Kaufman in [place], [place] HF: Anita Wiley  Subjective:    Doing ok today, denies dyspnea at rest.   Out 1.5 L net on lasix gtt at 15 mg/hr.  Weight down 5 lbs.  RHC 09/19/15 Procedural Findings: Hemodynamics (mmHg) RA mean 15 RV 44/17 PA 39/19, mean 26 PCWP mean 19 Oxygen saturations: PA 65% AO 100% Cardiac Output (Fick) 6.12  Cardiac Index (Fick) 3.11 PVR 1.1 WU  Objective:   Weight Range: 195 lb 5.2 oz (88.6 kg) Body mass index is 32.5 kg/(m^2).   Vital Signs:   Temp:  [97.9 F (36.6 C)-98.3 F (36.8 C)] 98.2 F (36.8 C) (10/13 0518) Pulse Rate:  [60-65] 65 (10/13 0518) Resp:  [0-27] 16 (10/13 0518) BP: (91-123)/(43-63) 123/53 mmHg (10/13 0518) SpO2:  [0 %-100 %] 94 % (10/13 0518) Weight:  [195 lb 5.2 oz (88.6 kg)] 195 lb 5.2 oz (88.6 kg) (10/13 0518) Last BM Date: 09/19/15  Weight change: Filed Weights   09/18/15 0622 09/19/15 0344 09/20/15 0518  Weight: 199 lb 6.4 oz (90.447 kg) 199 lb 3.2 oz (90.357 kg) 195 lb 5.2 oz (88.6 kg)    Intake/Output:   Intake/Output Summary (Last 24 hours) at 09/20/15 0833 Last data filed at 09/20/15 0707  Gross per 24 hour  Intake 2012.9 ml  Output   3525 ml  Net -1512.1 ml     Physical Exam: General: Elderly, NAD.  HEENT: normal Neck: supple. JVP 12-14. Carotids 2+ bilat; no bruits. No thyromegaly or nodule noted. Cor: PMI normal. RRR. No rubs, gallops. 3/6 HSM LLSB. Lungs: Slightly diminished bases. Abdomen: obese, soft, NT, ND  No HSM. No bruits or masses. +BS Extremities: no cyanosis, clubbing, rash. 2+ pretibial edema. Right groin cath site benign.  Neuro: alert & orientedx3, cranial nerves grossly intact. moves all 4 extremities w/o difficulty. Affect flat  Telemetry: a sensed v paced 60s  Labs: CBC  Recent Labs  09/19/15 1040 09/19/15 1505  WBC 5.9 6.3  HGB 9.0* 8.3*  HCT 28.0* 26.0*  MCV 88.1 89.0  PLT 163 144*   Basic  Metabolic Panel  Recent Labs  09/18/15 0518 09/19/15 0340 09/19/15 1505  NA 138 137  --   K 4.2 4.3  --   CL 97* 94*  --   CO2 31 30  --   GLUCOSE 85 97  --   BUN 46* 48*  --   CREATININE 1.96* 2.16* 2.14*  CALCIUM 9.2 9.3  --    Liver Function Tests No results for input(s): AST, ALT, ALKPHOS, BILITOT, PROT, ALBUMIN in the last 72 hours. No results for input(s): LIPASE, AMYLASE in the last 72 hours. Cardiac Enzymes No results for input(s): CKTOTAL, CKMB, CKMBINDEX, TROPONINI in the last 72 hours.  BNP: BNP (last 3 results)  Recent Labs  08/03/15 1845 09/11/15 1726  BNP 626.1* 1228.8*    ProBNP (last 3 results)  Recent Labs  11/06/14 1255  PROBNP 2232.0*     D-Dimer No results for input(s): DDIMER in the last 72 hours. Hemoglobin A1C No results for input(s): HGBA1C in the last 72 hours. Fasting Lipid Panel No results for input(s): CHOL, HDL, LDLCALC, TRIG, CHOLHDL, LDLDIRECT in the last 72 hours. Thyroid Function Tests No results for input(s): TSH, T4TOTAL, T3FREE, THYROIDAB in the last 72 hours.  Invalid input(s): FREET3  Other results:     Imaging/Studies:  Dg Chest 2 View  09/20/2015  CLINICAL DATA:  Shortness of breath. EXAM:  CHEST  2 VIEW COMPARISON:  09/15/2015. FINDINGS: Cardiac pacer with lead tips in right atrium right ventricle. Cardiomegaly. No focal pulmonary infiltrate. No pleural effusion or pneumothorax. IMPRESSION: 1. Cardiac pacer with lead tips in right atrium and right ventricle. Cardiomegaly. No evidence of overt congestive heart failure. 2. No acute pulmonary disease. Electronically Signed   By: Maisie Fushomas  Register   On: 09/20/2015 08:21    Latest Echo  Latest Cath   Medications:     Scheduled Medications: . allopurinol  100 mg Oral Daily  . aspirin EC  81 mg Oral Daily  . carvedilol  12.5 mg Oral BID WC  . enoxaparin (LOVENOX) injection  30 mg Subcutaneous Q24H  . ferrous sulfate  325 mg Oral Q breakfast  . potassium  chloride  40 mEq Oral BID  . sodium chloride  3 mL Intravenous Q12H  . sodium chloride  3 mL Intravenous Q12H  . sodium chloride  3 mL Intravenous Q12H  . sodium chloride  3 mL Intravenous Q12H  . verapamil  180 mg Oral Daily    Infusions: . sodium chloride    . furosemide (LASIX) infusion 15 mg/hr (09/19/15 1720)    PRN Medications: sodium chloride, sodium chloride, sodium chloride, sodium chloride, acetaminophen, acetaminophen, ondansetron (ZOFRAN) IV, ondansetron (ZOFRAN) IV, senna-docusate, sodium chloride, sodium chloride, sodium chloride, sodium chloride   Assessment/Plan  Anita Wiley is 74 y.o. AA female with pmh of Diastolic HF EF 55-60% with grade 3 DD. Normal RV echo 08/05/15, HTN, gout, and bradycardia s/p Medtronic dual chamber pacemaker in 2010. HF team was consulted due to poor diuresis with increasing diuretics. 1. Acute on chronic diastolic HF, LVEF 60-65% with Grade 2 DD 09/18/2015.  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  Remains volume overloaded but diuresed well on Lasix gtt yesterday.   - Will continue Lasix gtt 15 mg/hr for today. Creatinine stable. - Continue daily weights and strict I/Os 2. AKI on CKD: Monitor closely with diuresis.  2.1 on admit. As low as 1.34 to 1.5 in 07/2015. 2.15 today 3. HTN: Stable. 4. Normal coronaries in 10/2014  Dispo: Likely home in 48 hrs. PT recommended Home Health with PT.   Length of Stay: [address]  [name] 09/20/2015, 8:33 AM  Advanced Heart Failure Team Pager [phone] (M-F; 7a - 4p)  Please contact [facility] for night-coverage after hours (4p -7a ) and weekends on [email or web address]  Patient seen with PA, agree with the above note.  Diuresed better yesterday on Lasix gtt.  Still volume overloaded but weight down. 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  May not be able to diurese her to normal CVP without renal function worsening.  I do think she  will need a day or 2 more of IV diuresis.  Continue Lasix gtt at 15 mg/hr.  Creatinine remains stable.   Needs CBC today post-cath yesterday.   Marca AnconaDalton Dezire Turk 09/20/2015 9:46 AM

## 2015-09-20 NOTE — Progress Notes (Signed)
Physical Therapy Treatment Patient Details Name: Anita Wiley [name]: [id number] DOB: [birth year] Today's Date: 09/20/2015    History of Present Illness 74 y.o. AA female with CHF exacerbation and poor ability to diurese. S/P heart cath on 10/12.    PT Comments    Patient mobilizing very well today. No SOB or DOE with activity. Vitals stable HR 69-81 with increased distance, and SpO2 >96% on room air. Patient states that she feels "so much better". Will continue to see and progress as tolerated. If patient continues to progress well, may not need HHPT.  Follow Up Recommendations  Home health PT     Equipment Recommendations  Rolling walker with 5" wheels    Recommendations for Other Services       Precautions / Restrictions Precautions Precautions: Fall Restrictions Weight Bearing Restrictions: No    Mobility  Bed Mobility               General bed mobility comments: received in chair  Transfers Overall transfer level: Needs assistance Equipment used: Rolling walker (2 wheeled) Transfers: Sit to/from Stand Sit to Stand: Supervision         General transfer comment: VCs for hand placement and positioning during transition  Ambulation/Gait Ambulation/Gait assistance: Supervision Ambulation Distance (Feet): 280 Feet Assistive device: Rolling walker (2 wheeled) Gait Pattern/deviations: Step-through pattern;Decreased stride length;Trunk flexed Gait velocity: decreased   General Gait Details: Patient with 3 standing rest breaks to assess vitals, HR and O2 saturations stable, SpO2 >96% throughout activity on room air, HR 69 to 81. No DOE reported.   Stairs            Wheelchair Mobility    Modified Rankin (Stroke Patients Only)       Balance Overall balance assessment: No apparent balance deficits (not formally assessed)                                  Cognition Arousal/Alertness: Awake/alert Behavior During Therapy: WFL for tasks  assessed/performed Overall Cognitive Status: Within Functional Limits for tasks assessed                      Exercises      General Comments General comments (skin integrity, edema, etc.): educated regarding PLB techniques and AROM      Pertinent Vitals/Pain Pain Assessment: No/denies pain    Home Living                      Prior Function            PT Goals (current goals can now be found in the care plan section) Acute Rehab PT Goals Patient Stated Goal: to go home PT Goal Formulation: With patient Time For Goal Achievement: 09/19/15 Potential to Achieve Goals: Good Progress towards PT goals: Progressing toward goals    Frequency  Min 3X/week    PT Plan Current plan remains appropriate    Co-evaluation             End of Session Equipment Utilized During Treatment: Gait belt Activity Tolerance: Patient tolerated treatment well Patient left: in chair;with call bell/phone within reach;with chair alarm set     Time: 1040-1057 PT Time Calculation (min) (ACUTE ONLY): 17 min  Charges:  $Gait Training: 8-22 mins                    G Codes:  Fabio AsaWerner, Cattleya Dobratz J 09/20/2015, 11:03 AM Charlotte Crumbevon Khiree Bukhari, PT DPT  [phone]

## 2015-09-21 ENCOUNTER — Telehealth: Payer: Self-pay

## 2015-09-21 LAB — BASIC METABOLIC PANEL
Anion gap: 12 (ref 5–15)
BUN: 55 mg/dL — AB (ref 6–20)
CHLORIDE: 90 mmol/L — AB (ref 101–111)
CO2: 35 mmol/L — ABNORMAL HIGH (ref 22–32)
Calcium: 9.2 mg/dL (ref 8.9–10.3)
Creatinine, Ser: 2.37 mg/dL — ABNORMAL HIGH (ref 0.44–1.00)
GFR calc Af Amer: 22 mL/min — ABNORMAL LOW (ref >60)
GFR calc non Af Amer: 19 mL/min — ABNORMAL LOW (ref >60)
GLUCOSE: 108 mg/dL — AB (ref 65–99)
POTASSIUM: 4.1 mmol/L (ref 3.5–5.1)
Sodium: 137 mmol/L (ref 135–145)

## 2015-09-21 MED ORDER — TORSEMIDE 20 MG PO TABS
20.0000 mg | ORAL_TABLET | Freq: Every evening | ORAL | Status: DC
  Administered 2015-09-21: 20 mg via ORAL
  Filled 2015-09-21 (×3): qty 1

## 2015-09-21 MED ORDER — TORSEMIDE 20 MG PO TABS
40.0000 mg | ORAL_TABLET | Freq: Every day | ORAL | Status: DC
  Administered 2015-09-21 – 2015-09-22 (×2): 40 mg via ORAL
  Filled 2015-09-21: qty 2

## 2015-09-21 MED FILL — Heparin Sodium (Porcine) 2 Unit/ML in Sodium Chloride 0.9%: INTRAMUSCULAR | Qty: 500 | Status: AC

## 2015-09-21 NOTE — Progress Notes (Signed)
Advanced Heart Failure Rounding Note  Primary Physician/Cardiologist: Dr Rosalee Kaufman in [place], [place] HF: Anita Wiley  Subjective:    Feels good today.  No dyspnea.   Out 2.5 L net on lasix gtt at 15 mg/hr.  Weight down 5 lbs. 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  Full report below.  Objective:   Weight Range: 190 lb 6.4 oz (86.365 kg) Body mass index is 31.68 kg/(m^2).   Vital Signs:   Temp:  [98 F (36.7 C)-98.5 F (36.9 C)] 98.5 F (36.9 C) (10/14 0536) Pulse Rate:  [63-78] 78 (10/14 0536) Resp:  [16-21] 21 (10/14 0536) BP: (98-129)/(38-51) 129/51 mmHg (10/14 0536) SpO2:  [99 %-100 %] 99 % (10/14 0536) Weight:  [190 lb 6.4 oz (86.365 kg)] 190 lb 6.4 oz (86.365 kg) (10/14 0536) Last BM Date: 09/20/15  Weight change: Filed Weights   09/19/15 0344 09/20/15 0518 09/21/15 0536  Weight: 199 lb 3.2 oz (90.357 kg) 195 lb 5.2 oz (88.6 kg) 190 lb 6.4 oz (86.365 kg)    Intake/Output:   Intake/Output Summary (Last 24 hours) at 09/21/15 0820 Last data filed at 09/21/15 0730  Gross per 24 hour  Intake   1180 ml  Output   3775 ml  Net  -2595 ml     Physical Exam: General: Elderly, NAD.  HEENT: normal Neck: supple. JVP 12 cm. Carotids 2+ bilat; no bruits. No thyromegaly or nodule. Cor: PMI normal. RRR. No rubs, gallops. 3/6 HSM LLSB. Lungs: Slightly diminished Abdomen: obese, soft, nontender, nondistended  No HSM. No bruits or masses. +BS Extremities: no cyanosis, clubbing, rash. 2+ pretibial edema. Right groin cath site benign.  Neuro: alert & orientedx3, cranial nerves grossly intact. moves all 4 extremities w/o difficulty. Affect flat  Telemetry: a sensed v paced 70s  Labs: CBC  Recent Labs  09/19/15 1505 09/20/15 1045  WBC 6.3 6.1  HGB 8.3* 9.1*  HCT 26.0* 27.7*  MCV 89.0 89.1  PLT 144* 155   Basic Metabolic Panel  Recent Labs  09/20/15 0739 09/21/15 0422  NA 139 137  K 4.2 4.1  CL 91* 90*   CO2 32 35*  GLUCOSE 88 108*  BUN 49* 55*  CREATININE 2.15* 2.37*  CALCIUM 9.5 9.2   Liver Function Tests No results for input(s): AST, ALT, ALKPHOS, BILITOT, PROT, ALBUMIN in the last 72 hours. No results for input(s): LIPASE, AMYLASE in the last 72 hours. Cardiac Enzymes No results for input(s): CKTOTAL, CKMB, CKMBINDEX, TROPONINI in the last 72 hours.  BNP: BNP (last 3 results)  Recent Labs  08/03/15 1845 09/11/15 1726  BNP 626.1* 1228.8*    ProBNP (last 3 results)  Recent Labs  11/06/14 1255  PROBNP 2232.0*     D-Dimer No results for input(s): DDIMER in the last 72 hours. Hemoglobin A1C No results for input(s): HGBA1C in the last 72 hours. Fasting Lipid Panel No results for input(s): CHOL, HDL, LDLCALC, TRIG, CHOLHDL, LDLDIRECT in the last 72 hours. Thyroid Function Tests No results for input(s): TSH, T4TOTAL, T3FREE, THYROIDAB in the last 72 hours.  Invalid input(s): FREET3  Other results:     Imaging/Studies:  Dg Chest 2 View  09/20/2015  CLINICAL DATA:  Shortness of breath. EXAM: CHEST  2 VIEW COMPARISON:  09/15/2015. FINDINGS: Cardiac pacer with lead tips in right atrium right ventricle. Cardiomegaly. No focal pulmonary infiltrate. No pleural effusion or pneumothorax. IMPRESSION: 1. Cardiac pacer with lead tips in right atrium and right ventricle.  Cardiomegaly. No evidence of overt congestive heart failure. 2. No acute pulmonary disease. Electronically Signed   By: Maisie Fushomas  Register   On: 09/20/2015 08:21    Latest Echo  Latest Cath  RHC 09/19/15 Procedural Findings: Hemodynamics (mmHg) RA mean 15 RV 44/17 PA 39/19, mean 26 PCWP mean 19 Oxygen saturations: PA 65% AO 100% Cardiac Output (Fick) 6.12  Cardiac Index (Fick) 3.11 PVR 1.1 WU  Medications:     Scheduled Medications: . allopurinol  100 mg Oral Daily  . aspirin EC  81 mg Oral Daily  . carvedilol  12.5 mg Oral BID WC  . enoxaparin (LOVENOX) injection  30 mg Subcutaneous Q24H   . ferrous sulfate  325 mg Oral Q breakfast  . potassium chloride  40 mEq Oral BID  . sodium chloride  3 mL Intravenous Q12H  . sodium chloride  3 mL Intravenous Q12H  . sodium chloride  3 mL Intravenous Q12H  . sodium chloride  3 mL Intravenous Q12H  . verapamil  180 mg Oral Daily    Infusions: . sodium chloride    . furosemide (LASIX) infusion 15 mg/hr (09/21/15 0520)    PRN Medications: sodium chloride, sodium chloride, sodium chloride, sodium chloride, acetaminophen, acetaminophen, ondansetron (ZOFRAN) IV, ondansetron (ZOFRAN) IV, senna-docusate, sodium chloride, sodium chloride, sodium chloride, sodium chloride   Assessment/Plan  Anita Wiley is 74 y.o. AA female with pmh of Diastolic HF EF 55-60% with grade 3 DD. Normal RV echo 08/05/15, HTN, gout, and bradycardia s/p Medtronic dual chamber pacemaker in 2010. HF team was consulted due to poor diuresis with increasing diuretics.  1. Acute on chronic diastolic HF, LVEF 60-65% with Grade 2 DD 09/18/2015.  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   - Volume status improved, but JVP remains up with 1-2+ ankle edema. At previous baseline weight. - Decrease lasix to 10 mg/hr. Creatinine up - Continue daily weights and strict I/Os - CBC stable. - ? If some of her peripheral edema could be 2/2 verapamil 2. AKI on CKD: Monitor closely with diuresis.   - 2.1 on admit. 2.37 today 3. HTN: Stable. 4. Normal coronaries in 10/2014  Dispo: Likely home in 24 hrs. PT recommends Home Health with PT.   Length of Stay: 10  [name] 09/21/2015, 8:20 AM  Advanced Heart Failure Team Pager [phone] (M-F; 7a - 4p)  Please contact [facility] for night-coverage after hours (4p -7a ) and weekends on [email or web address]  Patient seen with PA, agree with the above note.  Weight now down about 15 lbs total.  Creatinine starting to rise.  She is still volume overloaded on exam.  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  Suspect that we are not going to be able to diurese her down to a normal CVP with RV failure and rising creatinine.  Would stop IV Lasix gtt now.  She can start po diuretics, would use torsemide 40 qam/20 qpm for now.  If creatinine remains relatively stable tomorrow, probably could go home with CHF clinic followup in a week.    Anita Wiley 09/21/2015 10:28 AM

## 2015-09-21 NOTE — Evaluation (Signed)
Physical Therapy Evaluation Patient Details Name: Anita Wiley [name]: [id number] DOB: March 26, 1941 Today's Date: 09/21/2015   History of Present Illness  74 y.o. AA female with CHF exacerbation and poor ability to diurese. S/P heart cath on 10/12.  Clinical Impression  Patient continues to make steady progress towards PT goals. Mobilizing well this session on room air with less fatigue.     Follow Up Recommendations Supervision for mobility    Equipment Recommendations  Rolling walker with 5" wheels    Recommendations for Other Services       Precautions / Restrictions Precautions Precautions: Fall Restrictions Weight Bearing Restrictions: No      Mobility  Bed Mobility Overal bed mobility: Modified Independent             General bed mobility comments: HOB elevated  Transfers Overall transfer level: Needs assistance Equipment used: Rolling walker (2 wheeled) Transfers: Sit to/from [employer] Transfers Sit to Stand: Supervision Stand pivot transfers: Supervision (to bedside commode prior to ambulation)       General transfer comment: VCs for hand placement and positioning during transition  Ambulation/Gait Ambulation/Gait assistance: Supervision Ambulation Distance (Feet): 300 Feet Assistive device: Rolling walker (2 wheeled) Gait Pattern/deviations: Step-through pattern;Decreased stride length;Trunk flexed Gait velocity: decreased Gait velocity interpretation: Below normal speed for age/gender General Gait Details: VSS throughout ambulation on room air, HR stable 80s-90s with activity. (one standing rest break)  Stairs            Wheelchair Mobility    Modified Rankin (Stroke Patients Only)       Balance Overall balance assessment: No apparent balance deficits (not formally assessed)                                           Pertinent Vitals/Pain Pain Assessment: No/denies pain    Home Living                        Prior Function                 Hand Dominance        Extremity/Trunk Assessment                         Communication      Cognition Arousal/Alertness: Awake/alert Behavior During Therapy: WFL for tasks assessed/performed Overall Cognitive Status: Within Functional Limits for tasks assessed                      General Comments      Exercises        Assessment/Plan    PT Assessment    PT Diagnosis     PT Problem List    PT Treatment Interventions     PT Goals (Current goals can be found in the Care Plan section) Acute Rehab PT Goals Patient Stated Goal: to go home PT Goal Formulation: With patient Time For Goal Achievement: 09/19/15 Potential to Achieve Goals: Good    Frequency Min 3X/week   Barriers to discharge        Co-evaluation               End of Session Equipment Utilized During Treatment: Gait belt Activity Tolerance: Patient tolerated treatment well Patient left:  (sitting on chair in front of sink for bathing nsg aware) Nurse Communication:  Mobility status         Time: [id number] PT Time Calculation (min) (ACUTE ONLY): 17 min   Charges:     PT Treatments $Gait Training: 8-22 mins   PT G CodesFabio Asa:        Lavaughn Haberle J 09/21/2015, 1:18 PM Charlotte Crumbevon Ricardo Schubach, PT DPT  [phone]

## 2015-09-21 NOTE — Progress Notes (Signed)
  Date: 09/21/2015  Patient name: Anita Wiley  Medical record number: [id number]  Date of birth: 02/04/1941   This patient's plan of care was discussed with the house staff. Please see Dr. Carmelina PealBowell's note for complete details. I concur with his findings.  Anita Wiley is daily improving.  She will be transitioned to oral medications today and possible discharge over the weekend if she continues to do well.  Consider restarting Bidil if blood pressure improves on oral diuretics.    Anita CatalinaEmily B Mullen, MD 09/21/2015, 3:04 PM

## 2015-09-21 NOTE — Care Management Important Message (Signed)
Important Message  Patient Details  Name: Anita Wiley [name]: [id number] Date of Birth: 02/15/1941   Medicare Important Message Given:  Yes-fourth notification given    Orson AloeMegan P Usha Slager 09/21/2015, 10:58 AM

## 2015-09-21 NOTE — Progress Notes (Signed)
Subjective: Patient seen and evaluated this morning. She reports feeling well this morning. Denies any dyspnea. Walked the halls this morning.   Objective: Vital signs in last 24 hours: Filed Vitals:   09/20/15 2017 09/21/15 0536 09/21/15 0917 09/21/15 1212  BP: 98/38 129/51 115/54 114/60  Pulse: 70 78 60 66  Temp: 98 F (36.7 C) 98.5 F (36.9 C) 98.5 F (36.9 C) 98 F (36.7 C)  TempSrc: Oral Oral Oral Oral  Resp: 18 21 18 18   Height:      Weight:  190 lb 6.4 oz (86.365 kg)    SpO2: 100% 99% 99% 99%   Weight change: -4 lb 14.8 oz (-2.235 kg)  Intake/Output Summary (Last 24 hours) at 09/21/15 1412 Last data filed at 09/21/15 1335  Gross per 24 hour  Intake    950 ml  Output   3875 ml  Net  -2925 ml   PHYSICAL EXAM GENERAL- alert, co-operative, not in any distress. HEENT- Atraumatic, normocephalic. +JVD. CARDIAC- RRR, 3/6 systolic murmur, no rubs or gallops. RESP- Moving equal volumes of air, no wheezes. ABDOMEN- Soft, nontender, no guarding or rebound, bowel sounds present. NEURO-AAOx3 EXTREMITIES- 2+ pretibial edema SKIN- Warm, dry, No rash or lesion. PSYCH- Normal mood and affect, appropriate thought content and speech  Medications: I have reviewed the patient's current medications. Scheduled Meds: . allopurinol  100 mg Oral Daily  . aspirin EC  81 mg Oral Daily  . carvedilol  12.5 mg Oral BID WC  . enoxaparin (LOVENOX) injection  30 mg Subcutaneous Q24H  . ferrous sulfate  325 mg Oral Q breakfast  . potassium chloride  40 mEq Oral BID  . sodium chloride  3 mL Intravenous Q12H  . sodium chloride  3 mL Intravenous Q12H  . sodium chloride  3 mL Intravenous Q12H  . sodium chloride  3 mL Intravenous Q12H  . torsemide  20 mg Oral QPM  . torsemide  40 mg Oral QPC breakfast  . verapamil  180 mg Oral Daily   Continuous Infusions: . 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 Diureses well on lasix ggt 15 mL/hr. Net 2.5 L out yesterday. Weight down 195lb --> 190 lbs. Edema in legs persists. Cr stable continues to rise, 2.37.  Stop lasix drip today and start PO diuretics. If she continues to have good diuresis and Cr is stable, likely discharge tomorrow. Follow up in CHF clinic in 1 week.  -D/C lasix ggt -Torsemide 40 mg qam, 20 mg qhs -40mEq PO potassium bid -Daily BMPs -Daily weights -Ins and outs -Will need home health PT at discharge  Acute renal failure superimposed on stage 3 chronic kidney disease: Cr increased, 2.37. Baseline creatine 1.3-1.5. Likely seeing a new baseline Cr for her. GFR 19 today. 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 Will get baseline labs. Likely will need follow up outpatient to nephrology.  -Check PTH, 25-Vit D, anemia panel, protein/cr ratio -BMP daily  HTN (hypertension): BP remains soft. Holding bidil with the aggressive diuresis and low BPs. On coreg and bidil at home. Also on verapamil. -Coreg 12.5 mg bid -Holding Bidil 20-37.5 mg tid -Verapamil 180 mg daily  Anemia: Continue Iron sulfate pills  CAD, nonocclusive, s/p pacemaker: On ASA 81 mg.  Normal coronaries in 10/2014.   DVT PPx: Lovenox  FEN: Heart Healthy  Dispo: Disposition is deferred at this time, awaiting improvement of current medical problems.  Anticipated discharge in approximately 1-2 day(s).   The patient does have a current PCP (Angela Adam, MD) and does need an [facility] hospital follow-up appointment after discharge.  The patient does not have transportation limitations that hinder transportation to clinic appointments.  .Services Needed at time of discharge: Y = Yes, Blank =  No PT:   OT:   RN:   Equipment:   Other:     LOS: 10 days   Valentino Nose, MD IMTS PGY-1 [phone] 09/21/2015, 2:12 PM

## 2015-09-21 NOTE — Telephone Encounter (Signed)
Attempted call to patient.  [facility].  Patient currently not established in carelink.  She has appointment scheduled in CHF clinic on 10/03/2015.

## 2015-09-22 LAB — BASIC METABOLIC PANEL
Anion gap: 11 (ref 5–15)
BUN: 57 mg/dL — AB (ref 6–20)
CALCIUM: 9.2 mg/dL (ref 8.9–10.3)
CO2: 36 mmol/L — ABNORMAL HIGH (ref 22–32)
CREATININE: 2.3 mg/dL — AB (ref 0.44–1.00)
Chloride: 91 mmol/L — ABNORMAL LOW (ref 101–111)
GFR, EST AFRICAN AMERICAN: 23 mL/min — AB (ref >60)
GFR, EST NON AFRICAN AMERICAN: 19 mL/min — AB (ref >60)
Glucose, Bld: 87 mg/dL (ref 65–99)
Potassium: 4.2 mmol/L (ref 3.5–5.1)
SODIUM: 138 mmol/L (ref 135–145)

## 2015-09-22 LAB — PROTEIN / CREATININE RATIO, URINE
CREATININE, URINE: 49.28 mg/dL
Total Protein, Urine: <6 mg/dL

## 2015-09-22 LAB — FOLATE: Folate: 11.4 ng/mL (ref >5.9)

## 2015-09-22 LAB — IRON AND TIBC
Iron: 29 ug/dL (ref 28–170)
SATURATION RATIOS: 10 % — AB (ref 10.4–31.8)
TIBC: 290 ug/dL (ref 250–450)
UIBC: 261 ug/dL

## 2015-09-22 LAB — RETICULOCYTES
RBC.: 3.05 MIL/uL — AB (ref 3.87–5.11)
RETIC COUNT ABSOLUTE: 64.1 10*3/uL (ref 19.0–186.0)
RETIC CT PCT: 2.1 % (ref 0.4–3.1)

## 2015-09-22 LAB — VITAMIN B12: VITAMIN B 12: 843 pg/mL (ref 180–914)

## 2015-09-22 LAB — FERRITIN: Ferritin: 66 ng/mL (ref 11–307)

## 2015-09-22 MED ORDER — POTASSIUM CHLORIDE ER 20 MEQ PO TBCR: 40.0000 meq | EXTENDED_RELEASE_TABLET | Freq: Every day | ORAL | Status: DC

## 2015-09-22 MED ORDER — TORSEMIDE 20 MG PO TABS: ORAL_TABLET | ORAL | Status: DC

## 2015-09-22 NOTE — Progress Notes (Signed)
Subjective:  Patient was seen and examined this morning. She states her shortness of breath is much improved, leg swelling also improved. She asks if she can go home today.    Objective: Filed Vitals:   09/21/15 1800 09/21/15 1950 09/22/15 0411 09/22/15 0917  BP: 110/60 107/45 110/47 104/47  Pulse: 64 60 94   Temp:  98.1 F (36.7 C) 98 F (36.7 C)   TempSrc:  Oral Oral   Resp: Height:      Weight:   188 lb 3.2 oz (85.367 kg)   SpO2: 98% 97% 99%    General: Vital signs reviewed.  Patient is elderly female, in no acute distress and cooperative with exam.  Cardiovascular: RRR, 3/6 systolic murmur. Pulmonary/Chest: Minimal inspiratory crackles in lower lung fields, no wheezes, or rhonchi. Abdominal: Soft, non-tender, non-distended, BS + Extremities: 1+ pitting lower extremity edema bilaterally to ankles, pulses symmetric and intact bilaterally.  Neurological: A&O x3 Skin: Warm, dry and intact. No rashes or erythema.   Weight change: -2 lb 3.2 oz (-0.998 kg)  Intake/Output Summary (Last 24 hours) at 09/22/15 1037 Last data filed at 09/22/15 0647  Gross per 24 hour  Intake    680 ml  Output   2551 ml  Net  -1871 ml   Lab Results: Basic Metabolic Panel:  Recent Labs Lab 09/21/15 0422 09/22/15 0441  NA 137 138  K 4.1 4.2  CL 90* 91*  CO2 35* 36*  GLUCOSE 108* 87  BUN 55* 57*  CREATININE 2.37* 2.30*  CALCIUM 9.2 9.2   CBC:  Recent Labs Lab 09/19/15 1505 09/20/15 1045  WBC 6.3 6.1  HGB 8.3* 9.1*  HCT 26.0* 27.7*  MCV 89.0 89.1  PLT 144* 155   Coagulation:  Recent Labs Lab 09/19/15 1040  LABPROT 16.0*  INR 1.27   Anemia Panel:  Recent Labs Lab 09/22/15 0441  VITAMINB12 843  FOLATE 11.4  FERRITIN 66  TIBC 290  IRON 29  RETICCTPCT 2.1   Urine Drug Screen: Drugs of Abuse     Component Value Date/Time   LABOPIA NONE DETECTED 08/04/2015 0211   COCAINSCRNUR NONE DETECTED 08/04/2015 0211   LABBENZ NONE DETECTED 08/04/2015 0211   AMPHETMU NONE DETECTED 08/04/2015 0211   THCU NONE DETECTED 08/04/2015 0211   LABBARB NONE DETECTED 08/04/2015 0211    Medications:  I have reviewed the patient's current medications. Prior to Admission:  Prescriptions prior to admission  Medication Sig Dispense Refill Last Dose  . allopurinol (ZYLOPRIM) 100 MG tablet Take 100 mg by mouth daily.   09/11/2015 at Unknown time  . aspirin 81 MG tablet Take 81 mg by mouth daily.   09/11/2015 at Unknown time  . carvedilol (COREG) 12.5 MG tablet Take 1 tablet (12.5 mg total) by mouth 2 (two) times daily with a meal. 60 tablet 0 09/11/2015 at 0900  . ferrous sulfate 325 (65 FE) MG tablet Take 325 mg by mouth daily with breakfast.   09/11/2015 at Unknown time  . furosemide (LASIX) 40 MG tablet Take 40 mg by mouth daily.   09/11/2015 at Unknown time  . isosorbide-hydrALAZINE (BIDIL) 20-37.5 MG tablet Take 1 tablet by mouth 3 (three) times daily.   09/11/2015 at Unknown time  . verapamil (COVERA HS) 180 MG (CO) 24 hr tablet Take 180 mg by mouth daily.    09/11/2015 at Unknown time  . [DISCONTINUED] potassium chloride (K-DUR) 10 MEQ tablet Take 10 mEq by mouth daily.   09/11/2015  at Unknown time  . azithromycin (ZITHROMAX) 500 MG tablet Take 1 tablet (500 mg total) by mouth daily. (Patient not taking: Reported on 09/11/2015) 5 tablet 0 Not Taking at Unknown time  . cefUROXime (CEFTIN) 500 MG tablet Take 1 tablet (500 mg total) by mouth 2 (two) times daily with a meal. (Patient not taking: Reported on 09/11/2015) 10 tablet 0 Not Taking at Unknown time   Scheduled Meds: . allopurinol  100 mg Oral Daily  . aspirin EC  81 mg Oral Daily  . carvedilol  12.5 mg Oral BID WC  . enoxaparin (LOVENOX) injection  30 mg Subcutaneous Q24H  . ferrous sulfate  325 mg Oral Q breakfast  . potassium chloride  40 mEq Oral BID  . sodium chloride  3 mL Intravenous Q12H  . sodium chloride  3 mL Intravenous Q12H  . sodium chloride  3 mL Intravenous Q12H  . sodium chloride  3 mL  Intravenous Q12H  . torsemide  20 mg Oral QPM  . torsemide  40 mg Oral QPC breakfast  . verapamil  180 mg Oral Daily   Continuous Infusions: . 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 She continued to do well on the torsemide 40 QAM and 20 QPM. Weight has trended 195>190>188, for a 13 pound weight loss in total. Creatinine has been stable at 2.3 this morning from 2.37 yesterday.Patient will be discharged home today on Torsemide 40 mg QAM and 20 mg QPM and Kdur 40 mEq daily.  -Discharge to home if okay with Cardiology -Torsemide 40 mg qam and 20 mg qhs -Patient has follow up with the HF team on 10/26 and with her PCP  Acute Renal Failure on CKD Stage III: Cr stable overnight, 2.37>2.3 this morning. Baseline creatine 1.3-1.5. Likely seeing a new baseline Cr for her. 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 Patient will likely need an outpatient referral to nephrology from her PCP.  -Recheck BMET at follow up appointment -Consider referral to Nephrology as outpatient  HTN (hypertension): BP remains soft, 110/47 this morning. We held bidil with the aggressive diuresis and low BPs. Patient is on coreg, bidil, and verapamil. -Continue Coreg 12.5 mg bid -Continue to hold Bidil 20-37.5 mg tid, can be restarted at  outpatient follow up appointment -Continue Verapamil 180 mg daily  CAD, nonocclusive, s/p pacemaker: On ASA 81 mg. Normal coronaries in 10/2014.  -Continue ASA 81 mg daily -Continue Coreg 12.5 mg BID  DVT PPx: Lovenox  FEN: Heart Healthy  Dispo:  Anticipated discharge today.   The patient does have a current PCP (Angela AdamSyed A Ahmed, MD) and [facility] after discharge.  The patient does have transportation limitations that hinder transportation to clinic appointments.  .Services Needed at time of discharge: Y = Yes, Blank = No PT: yes  OT:   RN: yes  Equipment:  Other:     LOS: 11 days   AlexaJill AlexandersDO PGY-1 Internal Medicine Resident Pager # [phone] 09/22/2015 10:37 AM

## 2015-09-22 NOTE — Discharge Instructions (Signed)
TAKE TORSEMIDE 40 MG (2 PILLS) IN THE MORNING AND 20 MG (1 PILL) AT NIGHT.   TAKE POTASSIUM 40 MEQ A DAY.   STOP TAKING YOUR LASIX.   STOP TAKING YOUR BIDIL (ISOSORBIDE AND HYDRALAZINE) FOR NOW, AS YOUR BLOOD PRESSURE IS LOW.   CONTINUE TAKING YOUR COREG, ASPIRIN, AND VERAPAMIL.

## 2015-09-22 NOTE — Progress Notes (Signed)
Discharged home with family present at the time of D/C. Set up with [facility], per CM.

## 2015-09-23 LAB — PARATHYROID HORMONE, INTACT (NO CA): PTH: 120 pg/mL — ABNORMAL HIGH (ref 15–65)

## 2015-09-23 NOTE — Care Management Note (Signed)
Case Management Note  Patient Details  Name: Anita HaagensenVenna Mittleman [name]: [id number] Date of Birth: 08/19/1941  Subjective/Objective:                   worsening SOB Action/Plan:  Discharge planning Expected Discharge Date:  09/23/15               Expected Discharge Plan:  Home w Home Health Services  In-House Referral:     Discharge planning Services  CM Consult  Post Acute Care Choice:  Resumption of Svcs/PTA Provider Choice offered to:  NA  DME Arranged:    DME Agency:     HH Arranged:  PT, RN [facility]  Status of Service:     Medicare Important Message Given:  Yes-fourth notification given Date Medicare IM Given:    Medicare IM give by:    Date Additional Medicare IM Given:    Additional Medicare Important Message give by:     If discussed at Long Length of Stay Meetings, dates discussed:    Additional Comments: Cm called [facility] several times [phone] with no answer or voicemail.  CM faxed facesheet, orders, H&P, DC summary [facility] [phone]; CM received confirmation of receipt.  CM emailed weekday CM to notify this CM unable to reach [facility]. Yves DillJeffries, Bryam Taborda Christine, RN 09/23/2015, 11:06 AM.

## 2015-09-24 LAB — VITAMIN D 25 HYDROXY (VIT D DEFICIENCY, FRACTURES): Vit D, 25-Hydroxy: 19.5 ng/mL — ABNORMAL LOW (ref 30.0–100.0)

## 2015-10-03 ENCOUNTER — Encounter (HOSPITAL_COMMUNITY): Payer: Self-pay

## 2015-10-03 ENCOUNTER — Ambulatory Visit (HOSPITAL_COMMUNITY)
Admit: 2015-10-03 | Discharge: 2015-10-03 | Disposition: A | Discharge status: Home / Self Care | Payer: Medicare Other | Source: Ambulatory Visit | Attending: Cardiology | Admitting: Cardiology

## 2015-10-03 VITALS — BP 152/76 | HR 90 | Wt 180.8 lb

## 2015-10-03 DIAGNOSIS — N183 Chronic kidney disease, stage 3 unspecified: Secondary | ICD-10-CM

## 2015-10-03 DIAGNOSIS — Z950 Presence of cardiac pacemaker: Secondary | ICD-10-CM | POA: Insufficient documentation

## 2015-10-03 DIAGNOSIS — I5033 Acute on chronic diastolic (congestive) heart failure: Secondary | ICD-10-CM

## 2015-10-03 DIAGNOSIS — I130 Hypertensive heart and chronic kidney disease with heart failure and stage 1 through stage 4 chronic kidney disease, or unspecified chronic kidney disease: Secondary | ICD-10-CM | POA: Diagnosis not present

## 2015-10-03 DIAGNOSIS — Z79899 Other long term (current) drug therapy: Secondary | ICD-10-CM | POA: Insufficient documentation

## 2015-10-03 DIAGNOSIS — R001 Bradycardia, unspecified: Secondary | ICD-10-CM | POA: Insufficient documentation

## 2015-10-03 DIAGNOSIS — I272 Other secondary pulmonary hypertension: Secondary | ICD-10-CM | POA: Insufficient documentation

## 2015-10-03 DIAGNOSIS — I5032 Chronic diastolic (congestive) heart failure: Secondary | ICD-10-CM | POA: Insufficient documentation

## 2015-10-03 DIAGNOSIS — Z8249 Family history of ischemic heart disease and other diseases of the circulatory system: Secondary | ICD-10-CM | POA: Insufficient documentation

## 2015-10-03 DIAGNOSIS — Z7982 Long term (current) use of aspirin: Secondary | ICD-10-CM | POA: Insufficient documentation

## 2015-10-03 LAB — BASIC METABOLIC PANEL
ANION GAP: 14 (ref 5–15)
BUN: 74 mg/dL — AB (ref 6–20)
CHLORIDE: 96 mmol/L — AB (ref 101–111)
CO2: 29 mmol/L (ref 22–32)
Calcium: 10.1 mg/dL (ref 8.9–10.3)
Creatinine, Ser: 2.2 mg/dL — ABNORMAL HIGH (ref 0.44–1.00)
GFR calc Af Amer: 24 mL/min — ABNORMAL LOW (ref >60)
GFR calc non Af Amer: 21 mL/min — ABNORMAL LOW (ref >60)
GLUCOSE: 85 mg/dL (ref 65–99)
POTASSIUM: 3.9 mmol/L (ref 3.5–5.1)
Sodium: 139 mmol/L (ref 135–145)

## 2015-10-03 LAB — BRAIN NATRIURETIC PEPTIDE: B Natriuretic Peptide: 662.7 pg/mL — ABNORMAL HIGH (ref 0.0–100.0)

## 2015-10-03 MED ORDER — TORSEMIDE 20 MG PO TABS: 40.0000 mg | ORAL_TABLET | Freq: Two times a day (BID) | ORAL | Status: DC

## 2015-10-03 MED ORDER — POTASSIUM CHLORIDE ER 20 MEQ PO TBCR: 40.0000 meq | EXTENDED_RELEASE_TABLET | Freq: Two times a day (BID) | ORAL | Status: DC

## 2015-10-03 NOTE — Progress Notes (Signed)
Patient ID: Anita Wiley, female   DOB: [birth year], 74 y.o.   [name]: [id number] Primary Cardiologist: Dr. Shirlee Latch  HPI: Anita Wiley is 74 y.o. AA female with history of diastolic CHF EF 55-60% with grade 3 DD, normal RV echo 08/05/15, HTN, gout, and bradycardia s/p Medtronic dual chamber pacemaker in 2010.   Admitted 09/11/15 with marked volume overload. Diuresed with IV lasix and transitioned to torsemide 40/20 mg. Had RHC. Discharge weight 188 pounds.   She returns for post hospital follow up with her sister-in-law. Overall feeling ok.  Denies PND/Orthopnea. Limited activity due to fatigue. Ambulates with a walker. On Saturday she was weak at the grocery store, not able to walk across the store without dyspnea. Weight at home trending down 181 to 178 pounds. Lives alone and does all ADLs.  Labs (10/16): K 4.2, creatinine 2.3.   ECG: A-V sequential pacing  PMH: 1. CKD Stage III. 2. HTN 3. Gout 4. 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 5. Chronic diastolic CHF: Echo (10/16) with EF 60-65%, moderate LVH, moderate TR, PASP 73 mmHg.  RHC (10/16) with mean RA 15, PA 39/19 mean 26, mean PCWP 19, CI 3.1.  6. LHC (11/15) with normal coronaries.   ROS: All systems negative except as listed in HPI, PMH and Problem List.  Social History   Social History  . Marital Status: Widowed    Spouse Name: N/A  . Number of Children: N/A  . Years of Education: N/A   Occupational History  . Not on file.   Social History Main Topics  . Smoking status: Never Smoker   . Smokeless tobacco: Never Used  . Alcohol Use: No  . Drug Use: No  . Sexual Activity: Not on file   Other Topics Concern  . Not on file   Social History Narrative   Family History  Problem Relation Age of Onset  . Hypertension Mother   . CAD Sister     unkown age    Current Outpatient Prescriptions  Medication Sig Dispense Refill  . allopurinol (ZYLOPRIM) 100 MG tablet Take 100 mg by mouth daily.    Marland Kitchen aspirin 81  MG tablet Take 81 mg by mouth daily.    . carvedilol (COREG) 12.5 MG tablet Take 1 tablet (12.5 mg total) by mouth 2 (two) times daily with a meal. 60 tablet 0  . ferrous sulfate 325 (65 FE) MG tablet Take 325 mg by mouth daily with breakfast.    . potassium chloride 20 MEQ TBCR Take 40 mEq by mouth daily. 60 tablet 1  . torsemide (DEMADEX) 20 MG tablet TAKE 2 TABLETS IN THE MORNING AND 1 TABLET AT NIGHT. 90 tablet 11  . verapamil (COVERA HS) 180 MG (CO) 24 hr tablet Take 180 mg by mouth daily.      No current facility-administered medications for this encounter.    Filed Vitals:   10/03/15 1156  BP: 152/76  Pulse: 90  Weight: 180 lb 12.8 oz (82.01 kg)  SpO2: 97%    PHYSICAL EXAM:  General:  Well appearing. No resp difficulty.Ambulated in the clinic with a walker.  HEENT: normal Neck: supple. JVP 8-9 cm. Carotids 2+ bilaterally; no bruits. No lymphadenopathy or thryomegaly appreciated. Cor: PMI normal. Regular rate & rhythm. No rubs, gallops or murmurs. Widely split S2.  Lungs: clear Abdomen: soft, nontender, nondistended. No hepatosplenomegaly. No bruits or masses. Good bowel sounds. Extremities: no cyanosis, clubbing, rash. 1+ ankle edema Neuro: alert & orientedx3, cranial nerves grossly intact. Moves  all 4 extremities w/o difficulty. Affect pleasant.  ASSESSMENT & PLAN: 1. Chronic diastolic CHF:  This is complicated by prominent RV failure. RHC during 10/16 admission suggestive of right > left heart failure with pulmonary venous hypertension. She diuresed well during recent admission but still appears significantly volume overloaded.  - Increase torsemide to 40 mg twice a day.  Increase KCl to 40 bid.  - BMET/BNP today and repeat in 2 wks.  2. CKD: Stage III.  Check BMET today.  3. HTN: Stable. 4. Bradycardia: s/p Medtronic PPM.  5. 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    Amy Clegg NP-C  3:18 PM   Patient seen with NP, agree  with the above note.  She remains volume overloaded but improved compared to prior to hospitalization.  - Increase torsemide to 40 mg bid and KCl to 40 mEq bid.  - Follow labs as above.   - Return in 3 wks.   Marca AnconaDalton McLean 10/03/2015

## 2015-10-03 NOTE — Patient Instructions (Signed)
INCREASE Torsemide to 40mg  (2tablets) twice a day.  INCREASE Potassium to 40mg  (2 tablets) twice a day  TAKE YOUR EVENING MEDICATIONS AROUND 4 OR 5 PM.  Routine lab work today. (bmet bnp) Will notify you of abnormal results  LABS in 10 days (bmet)  FOLLOW UP: 3 months w/ Dr.McLean  Do the following things EVERYDAY: 1) Weigh yourself in the morning before breakfast. Write it down and keep it in a log. 2) Take your medicines as prescribed 3) Eat low salt foods-Limit salt (sodium) to 2000 mg per day.  4) Stay as active as you can everyday 5) Limit all fluids for the day to less than 2 liters

## 2015-10-04 ENCOUNTER — Telehealth (HOSPITAL_COMMUNITY): Payer: Self-pay

## 2015-10-04 MED ORDER — TORSEMIDE 20 MG PO TABS: 40.0000 mg | ORAL_TABLET | Freq: Two times a day (BID) | ORAL | Status: DC

## 2015-10-04 MED ORDER — POTASSIUM CHLORIDE ER 20 MEQ PO TBCR: 40.0000 meq | EXTENDED_RELEASE_TABLET | Freq: Two times a day (BID) | ORAL | Status: AC

## 2015-10-04 NOTE — Telephone Encounter (Signed)
Verified dosage of Torsemide and Potasium with HHN.  RX revised under free text to be more understandable for patient

## 2015-10-17 ENCOUNTER — Other Ambulatory Visit (HOSPITAL_COMMUNITY): Payer: Medicare Other

## 2015-10-29 ENCOUNTER — Encounter (HOSPITAL_COMMUNITY): Payer: Medicare Other

## 2015-11-22 ENCOUNTER — Encounter (HOSPITAL_COMMUNITY): Payer: Self-pay | Admitting: Neurology

## 2015-11-22 ENCOUNTER — Emergency Department (HOSPITAL_COMMUNITY): Payer: Medicare Other

## 2015-11-22 ENCOUNTER — Inpatient Hospital Stay (HOSPITAL_COMMUNITY)
Admission: EM | Admit: 2015-11-22 | Discharge: 2015-12-05 | DRG: 291 | Disposition: A | Discharge status: Skilled Nursing Facility | Payer: Medicare Other | Attending: Internal Medicine | Admitting: Internal Medicine

## 2015-11-22 DIAGNOSIS — J9601 Acute respiratory failure with hypoxia: Secondary | ICD-10-CM

## 2015-11-22 DIAGNOSIS — I10 Essential (primary) hypertension: Secondary | ICD-10-CM | POA: Diagnosis present

## 2015-11-22 DIAGNOSIS — I509 Heart failure, unspecified: Secondary | ICD-10-CM | POA: Diagnosis not present

## 2015-11-22 DIAGNOSIS — N179 Acute kidney failure, unspecified: Secondary | ICD-10-CM | POA: Diagnosis not present

## 2015-11-22 DIAGNOSIS — R29898 Other symptoms and signs involving the musculoskeletal system: Secondary | ICD-10-CM | POA: Insufficient documentation

## 2015-11-22 DIAGNOSIS — M109 Gout, unspecified: Secondary | ICD-10-CM | POA: Diagnosis present

## 2015-11-22 DIAGNOSIS — J9600 Acute respiratory failure, unspecified whether with hypoxia or hypercapnia: Secondary | ICD-10-CM

## 2015-11-22 DIAGNOSIS — I5033 Acute on chronic diastolic (congestive) heart failure: Secondary | ICD-10-CM | POA: Diagnosis present

## 2015-11-22 DIAGNOSIS — Z7982 Long term (current) use of aspirin: Secondary | ICD-10-CM

## 2015-11-22 DIAGNOSIS — J9620 Acute and chronic respiratory failure, unspecified whether with hypoxia or hypercapnia: Secondary | ICD-10-CM | POA: Diagnosis present

## 2015-11-22 DIAGNOSIS — R0602 Shortness of breath: Secondary | ICD-10-CM | POA: Diagnosis not present

## 2015-11-22 DIAGNOSIS — I272 Other secondary pulmonary hypertension: Secondary | ICD-10-CM | POA: Diagnosis present

## 2015-11-22 DIAGNOSIS — I480 Paroxysmal atrial fibrillation: Secondary | ICD-10-CM | POA: Insufficient documentation

## 2015-11-22 DIAGNOSIS — N183 Chronic kidney disease, stage 3 unspecified: Secondary | ICD-10-CM | POA: Diagnosis present

## 2015-11-22 DIAGNOSIS — Z66 Do not resuscitate: Secondary | ICD-10-CM | POA: Diagnosis not present

## 2015-11-22 DIAGNOSIS — Z950 Presence of cardiac pacemaker: Secondary | ICD-10-CM | POA: Diagnosis present

## 2015-11-22 DIAGNOSIS — K8689 Other specified diseases of pancreas: Secondary | ICD-10-CM | POA: Insufficient documentation

## 2015-11-22 DIAGNOSIS — I2510 Atherosclerotic heart disease of native coronary artery without angina pectoris: Secondary | ICD-10-CM | POA: Diagnosis present

## 2015-11-22 DIAGNOSIS — I5043 Acute on chronic combined systolic (congestive) and diastolic (congestive) heart failure: Secondary | ICD-10-CM | POA: Diagnosis present

## 2015-11-22 DIAGNOSIS — N184 Chronic kidney disease, stage 4 (severe): Secondary | ICD-10-CM

## 2015-11-22 DIAGNOSIS — R109 Unspecified abdominal pain: Secondary | ICD-10-CM

## 2015-11-22 DIAGNOSIS — E876 Hypokalemia: Secondary | ICD-10-CM | POA: Diagnosis not present

## 2015-11-22 DIAGNOSIS — D509 Iron deficiency anemia, unspecified: Secondary | ICD-10-CM | POA: Diagnosis present

## 2015-11-22 DIAGNOSIS — K869 Disease of pancreas, unspecified: Secondary | ICD-10-CM | POA: Diagnosis present

## 2015-11-22 DIAGNOSIS — Z79899 Other long term (current) drug therapy: Secondary | ICD-10-CM

## 2015-11-22 DIAGNOSIS — I130 Hypertensive heart and chronic kidney disease with heart failure and stage 1 through stage 4 chronic kidney disease, or unspecified chronic kidney disease: Principal | ICD-10-CM | POA: Diagnosis present

## 2015-11-22 HISTORY — DX: Heart failure, unspecified: I50.9

## 2015-11-22 LAB — BASIC METABOLIC PANEL
ANION GAP: 9 (ref 5–15)
BUN: 38 mg/dL — ABNORMAL HIGH (ref 6–20)
CALCIUM: 9.2 mg/dL (ref 8.9–10.3)
CHLORIDE: 104 mmol/L (ref 101–111)
CO2: 29 mmol/L (ref 22–32)
CREATININE: 2.34 mg/dL — AB (ref 0.44–1.00)
GFR calc non Af Amer: 19 mL/min — ABNORMAL LOW (ref >60)
GFR, EST AFRICAN AMERICAN: 22 mL/min — AB (ref >60)
GLUCOSE: 95 mg/dL (ref 65–99)
Potassium: 3.7 mmol/L (ref 3.5–5.1)
Sodium: 142 mmol/L (ref 135–145)

## 2015-11-22 LAB — GLUCOSE, CAPILLARY: Glucose-Capillary: 175 mg/dL — ABNORMAL HIGH (ref 65–99)

## 2015-11-22 LAB — BRAIN NATRIURETIC PEPTIDE: B Natriuretic Peptide: 1370 pg/mL — ABNORMAL HIGH (ref 0.0–100.0)

## 2015-11-22 LAB — TROPONIN I: TROPONIN I: 0.05 ng/mL — AB (ref <0.031)

## 2015-11-22 LAB — CBC
HCT: 32.5 % — ABNORMAL LOW (ref 36.0–46.0)
HEMOGLOBIN: 10.2 g/dL — AB (ref 12.0–15.0)
MCH: 28.6 pg (ref 26.0–34.0)
MCHC: 31.4 g/dL (ref 30.0–36.0)
MCV: 91 fL (ref 78.0–100.0)
Platelets: 130 10*3/uL — ABNORMAL LOW (ref 150–400)
RBC: 3.57 MIL/uL — AB (ref 3.87–5.11)
RDW: 16.8 % — ABNORMAL HIGH (ref 11.5–15.5)
WBC: 6.9 10*3/uL (ref 4.0–10.5)

## 2015-11-22 LAB — I-STAT TROPONIN, ED: Troponin i, poc: 0.04 ng/mL (ref 0.00–0.08)

## 2015-11-22 MED ORDER — ASPIRIN EC 81 MG PO TBEC
81.0000 mg | DELAYED_RELEASE_TABLET | Freq: Every day | ORAL | Status: DC
  Administered 2015-11-22 – 2015-11-25 (×4): 81 mg via ORAL
  Filled 2015-11-22 (×5): qty 1

## 2015-11-22 MED ORDER — FUROSEMIDE 10 MG/ML IJ SOLN
40.0000 mg | Freq: Once | INTRAMUSCULAR | Status: AC
  Administered 2015-11-22: 40 mg via INTRAVENOUS
  Filled 2015-11-22: qty 4

## 2015-11-22 MED ORDER — FUROSEMIDE 10 MG/ML IJ SOLN: 20.0000 mg | Freq: Once | INTRAMUSCULAR | Status: DC

## 2015-11-22 MED ORDER — SODIUM CHLORIDE 0.9 % IJ SOLN: 3.0000 mL | INTRAMUSCULAR | Status: DC | PRN

## 2015-11-22 MED ORDER — FERROUS SULFATE 325 (65 FE) MG PO TABS
325.0000 mg | ORAL_TABLET | Freq: Every day | ORAL | Status: DC
  Administered 2015-11-23: 325 mg via ORAL
  Filled 2015-11-22: qty 1

## 2015-11-22 MED ORDER — METOLAZONE 2.5 MG PO TABS
2.5000 mg | ORAL_TABLET | Freq: Every day | ORAL | Status: DC
  Administered 2015-11-22 – 2015-11-25 (×4): 2.5 mg via ORAL
  Filled 2015-11-22 (×4): qty 1

## 2015-11-22 MED ORDER — ONDANSETRON HCL 4 MG/2ML IJ SOLN
4.0000 mg | Freq: Four times a day (QID) | INTRAMUSCULAR | Status: DC | PRN
  Administered 2015-11-26: 4 mg via INTRAVENOUS
  Filled 2015-11-22: qty 2

## 2015-11-22 MED ORDER — ACETAMINOPHEN 325 MG PO TABS: 650.0000 mg | ORAL_TABLET | ORAL | Status: DC | PRN

## 2015-11-22 MED ORDER — ALLOPURINOL 100 MG PO TABS
100.0000 mg | ORAL_TABLET | Freq: Every day | ORAL | Status: DC
  Administered 2015-11-22 – 2015-12-05 (×14): 100 mg via ORAL
  Filled 2015-11-22 (×14): qty 1

## 2015-11-22 MED ORDER — ENOXAPARIN SODIUM 30 MG/0.3ML ~~LOC~~ SOLN
30.0000 mg | SUBCUTANEOUS | Status: DC
  Administered 2015-11-22 – 2015-11-25 (×4): 30 mg via SUBCUTANEOUS
  Filled 2015-11-22 (×4): qty 0.3

## 2015-11-22 MED ORDER — SODIUM CHLORIDE 0.9 % IJ SOLN
3.0000 mL | Freq: Two times a day (BID) | INTRAMUSCULAR | Status: DC
  Administered 2015-11-22 – 2015-12-05 (×15): 3 mL via INTRAVENOUS

## 2015-11-22 MED ORDER — NITROGLYCERIN 2 % TD OINT
1.0000 [in_us] | TOPICAL_OINTMENT | Freq: Once | TRANSDERMAL | Status: AC
  Administered 2015-11-22: 1 [in_us] via TOPICAL
  Filled 2015-11-22: qty 1

## 2015-11-22 MED ORDER — VERAPAMIL HCL ER 180 MG PO TBCR
180.0000 mg | EXTENDED_RELEASE_TABLET | Freq: Every day | ORAL | Status: DC
  Administered 2015-11-22 – 2015-12-05 (×14): 180 mg via ORAL
  Filled 2015-11-22 (×16): qty 1

## 2015-11-22 MED ORDER — POTASSIUM CHLORIDE CRYS ER 20 MEQ PO TBCR
40.0000 meq | EXTENDED_RELEASE_TABLET | Freq: Two times a day (BID) | ORAL | Status: DC
  Administered 2015-11-22 – 2015-12-05 (×26): 40 meq via ORAL
  Filled 2015-11-22 (×28): qty 2

## 2015-11-22 MED ORDER — SODIUM CHLORIDE 0.9 % IV SOLN: 250.0000 mL | INTRAVENOUS | Status: DC | PRN

## 2015-11-22 MED ORDER — IPRATROPIUM-ALBUTEROL 0.5-2.5 (3) MG/3ML IN SOLN: 3.0000 mL | Freq: Once | RESPIRATORY_TRACT | Status: DC

## 2015-11-22 MED ORDER — FUROSEMIDE 10 MG/ML IJ SOLN
40.0000 mg | INTRAMUSCULAR | Status: AC
  Administered 2015-11-22: 40 mg via INTRAVENOUS
  Filled 2015-11-22: qty 4

## 2015-11-22 MED ORDER — FUROSEMIDE 10 MG/ML IJ SOLN
80.0000 mg | Freq: Two times a day (BID) | INTRAMUSCULAR | Status: DC
  Administered 2015-11-22 – 2015-11-23 (×2): 80 mg via INTRAVENOUS
  Filled 2015-11-22 (×2): qty 8

## 2015-11-22 MED ORDER — CARVEDILOL 12.5 MG PO TABS
12.5000 mg | ORAL_TABLET | Freq: Two times a day (BID) | ORAL | Status: DC
  Administered 2015-11-22 – 2015-11-23 (×3): 12.5 mg via ORAL
  Filled 2015-11-22 (×3): qty 1

## 2015-11-22 NOTE — ED Notes (Signed)
Pt reports sob for several days was just released from hospital yesterday, has hx of CHF. Denies cp. Pt is a x 4.

## 2015-11-22 NOTE — ED Provider Notes (Signed)
CSN: [id number]     Arrival date & time 11/22/15  1246 History   First MD Initiated Contact with Patient 11/22/15 1328     Chief Complaint  Patient presents with  . Shortness of Breath     (Consider location/radiation/quality/duration/timing/severity/associated sxs/prior Treatment) HPI   Patient is a [age] showed female past medical history of CHF, hypertension and CAD who presents to the ED via EMS with complaint of shortness of breath. Patient reports she has felt short of breath for the past year but notes that has worsened over the past few weeks. She notes her shortness of breath worsens when laying supine or trying to ambulate. Endorses associated cough, wheezing and worsening leg swelling. Denies fever, chills, headache, chest pain, hemoptysis, abdominal pain, nausea, vomiting, diarrhea, numbness, tingling, weakness, lightheadedness, dizziness. Patient states she was seen in the ED [facility], discharged home and [place], discharged to nursing facility and states she was recently released from the nursing home yesterday. Patient states she has been taking her medications as prescribed. Patient's sister is at bedside and reports that she thinks the patient has not been taking her medications as prescribed. She notes the patient is hard of hearing and often miss hears the instructions given to her by her PCP regarding medication changes. She notes she thinks the patient has been taking her potassium pill instead of taking her diuretic. Sr. reports patient has had difficulty walking at home due to shortness of breath.  Past Medical History  Diagnosis Date  . Hypertension   . Bradycardia     s/p pacemaker in 2010  . Chronic kidney disease   . Gout   . Pacemaker 2010  . Arthritis     OSTEO  . Gout   . CHF (congestive heart failure) [facility](HCC)    Past Surgical History  Procedure Laterality Date  . Pacemaker  insertion      2010  . Knee surgery    . Abdominal hysterectomy      at age 74  . Insert / replace / remove pacemaker  2010  . Left heart catheterization with coronary angiogram N/A 11/06/2014    Procedure: LEFT HEART CATHETERIZATION WITH CORONARY ANGIOGRAM;  Surgeon: Lennette Biharihomas A Kelly, MD;  [facility];  Service: Cardiovascular;  Laterality: N/A;  . Cardiac catheterization N/A 09/19/2015    Procedure: Right Heart Cath;  Surgeon: Laurey Moralealton S McLean, MD;  [facility];  Service: Cardiovascular;  Laterality: N/A;   Family History  Problem Relation Age of Onset  . Hypertension Mother   . CAD Sister     unkown age   Social History  Substance Use Topics  . Smoking status: Never Smoker   . Smokeless tobacco: Never Used  . Alcohol Use: No   OB History    No data available     Review of Systems  Respiratory: Positive for cough, shortness of breath and wheezing.   Cardiovascular: Positive for leg swelling.  All other systems reviewed and are negative.     Allergies  Codeine  Home Medications   Prior to Admission medications   Medication Sig Start Date End Date Taking? Authorizing Provider  allopurinol (ZYLOPRIM) 100 MG tablet Take 100 mg by mouth daily.   Yes Historical Provider, MD  aspirin 81 MG tablet Take 81 mg by mouth daily.   Yes Historical Provider, MD  carvedilol (COREG) 12.5 MG tablet Take  1 tablet (12.5 mg total) by mouth 2 (two) times daily with a meal. 08/08/15  Yes Clydia Llano, MD  ferrous sulfate 325 (65 FE) MG tablet Take 325 mg by mouth daily with breakfast.   Yes Historical Provider, MD  Potassium Chloride ER 20 MEQ TBCR Take 40 mEq by mouth 2 (two) times daily. 10/04/15  Yes Laurey Morale, MD  torsemide (DEMADEX) 20 MG tablet Take 2 tablets (40 mg total) by mouth 2 (two) times daily. Take two 20 mg tablets twice a day Patient taking differently: Take 2-40 mg by mouth See admin instructions.  in am,  in pm 10/04/15  Yes Laurey Morale, MD  verapamil (CALAN-SR) 180 MG CR tablet Take 180 mg by mouth daily. 09/06/15  Yes Historical Provider, MD   BP 163/79 mmHg  Pulse 70  Temp(Src) 98.7 F (37.1 C) (Oral)  Resp 15  SpO2 99% Physical Exam  Constitutional: She is oriented to person, place, and time. She appears well-developed and well-nourished.  HENT:  Head: Normocephalic and atraumatic.  Mouth/Throat: Oropharynx is clear and moist. No oropharyngeal exudate.  Eyes: Conjunctivae and EOM are normal. Right eye exhibits no discharge. Left eye exhibits no discharge. No scleral icterus.  Neck: Normal range of motion. Neck supple.  Cardiovascular: Normal rate, regular rhythm, normal heart sounds and intact distal pulses.   Pulmonary/Chest: Effort normal. She has no wheezes. She has no rales. She exhibits no tenderness.  Mildly decreased breath sounds throughout lung fields. No hypoxia or increased WOB, speaking in somewhat partial sentences stopping to catch her breath intermittently, SpO2 98% on RA.   Abdominal: Soft. Bowel sounds are normal. She exhibits no distension and no mass. There is no tenderness. There is no rebound and no guarding.  Musculoskeletal: She exhibits edema.  2+ pitting edema noted to BLE extending up to thigh  Neurological: She is alert and oriented to person, place, and time.  Skin: Skin is warm and dry.  Left breast with nonpitting edema and appears to be larger than right breast, no nipple inversion or discharge, no skin changes, mild tenderness.  Nursing note and vitals reviewed.   ED Course  Procedures (including critical care time) Labs Review Labs Reviewed  BASIC METABOLIC PANEL - Abnormal; Notable for the following:    BUN 38 (*)    Creatinine, Ser 2.34 (*)    GFR calc non Af Amer 19 (*)    GFR calc Af Amer 22 (*)    All other components within normal limits  CBC - Abnormal; Notable for the following:    RBC 3.57 (*)    Hemoglobin 10.2 (*)    HCT 32.5 (*)    RDW 16.8 (*)     Platelets 130 (*)    All other components within normal limits  BRAIN NATRIURETIC PEPTIDE - Abnormal; Notable for the following:    B Natriuretic Peptide 1370.0 (*)    All other components within normal limits  I-STAT TROPOININ, ED    Imaging Review Dg Chest 2 View  11/22/2015  CLINICAL DATA:  Shortness of Breath EXAM: CHEST  2 VIEW COMPARISON:  September 20, 2015 FINDINGS: There is no edema or consolidation. Heart is enlarged, stable. Prominence of the main pulmonary outflow tract is also a stable finding. There appears to be normal tapering of the pulmonary arteries more distally. Pacemaker leads are attached to the right atrium and middle cardiac vein. No pneumothorax. No apparent adenopathy. There is arthropathy in both shoulders as well as  degenerative change in the thoracic spine. IMPRESSION: Cardiomegaly. Prominence of the main pulmonary outflow tract potentially may indicate a degree of pulmonary arterial hypertension. No edema or consolidation. Stable pacemaker lead positions. 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 Electronically Signed   By: Bretta Bang III M.D.   On: 11/22/2015 13:49   I have personally reviewed and evaluated these images and lab results as part of my medical decision-making.   EKG Interpretation None      MDM   Final diagnoses:  Acute on chronic congestive heart failure, unspecified congestive heart failure type [facility])    Patient presents with worsening shortness of breath, cough, wheezing and leg swelling. History of CHF. She was recently released home from a nursing facility yesterday after a hospitalization for CHF exacerbation. VSS. Exam revealed mild decreased breath sounds throughout and patient appears to have increased work of breathing, oxygen 98% on room air, 2+ pitting edema noted to bilateral lower extremities. Chest x-ray revealed cardiomegaly and pulmonary arterial hypertension. BNP 1370. Troponin 0.04. Patient appears to be in acute CHF  exacerbation. Patient given Nitropaste and IV Lasix. Consult to hospitalist for admission regarding CHF exacerbation, agrees to admission. Orders place for telemetry bed. Consult at heart failure team, they report they will see the patient. Discussed results and plan for admission with patient.    Satira Sark [place], [place] 11/22/15 1553  Laurence Spates, MD 11/23/15 1101

## 2015-11-22 NOTE — Consult Note (Signed)
Advanced Heart Failure Team Consult Note  Referring Physician: Dr Sunnie Nielsen Primary Cardiologist:  Dr. Shirlee Latch   Reason for Consultation: A/C diastolic CHF   HPI:    Anita Wiley is a 74 y.o. female with history of diastolic CHF Echo 09/18/15 EF 16-10% with grade 2 DD, normal RV, PA peak pressure 73 mm Hg, HTN, gout, and bradycardia s/p Medtronic dual chamber pacemaker in 2010 who presented to [facility] with worsening SOB and pedal edema. Pertinent admission labs include K 3.7, Cr 2.34, BNP 1370, and troponin 0.04. CXR with cardiomegaly, prominent main pulmonary outflow tract but no edema or consolidation.   Admitted 09/11/15 with marked volume overload. Diuresed with IV lasix and transitioned to torsemide 40/20 mg. Had RHC. Discharge weight 188 pounds. No weight yet this admission.   Daughter at bedside states she was discharged from rehab facility yesterday, and despite orders from Dr Shirlee Latch for torsemide, she had been getting lasix for past several weeks. 3 pillow orthopnea (uses 2 chronically). + edema. SOB with any movement. Says her last weight at the facility was around 220.    Review of Systems: [y] = yes,  = no   General: Weight gain [ y]; Weight loss ; Anorexia ; Fatigue Cove.Etienne ]; Fever ; Chills ; Weakness Cove.Etienne ]  Cardiac: Chest pain/pressure ; Resting SOB Cove.Etienne ]; Exertional SOB Cove.Etienne ]; Orthopnea [ y]; Pedal Edema y; Palpitations ; Syncope ; Presyncope ; Paroxysmal nocturnal dyspnea[ ]   Pulmonary: Cough Cove.Etienne ]; Wheezing[ ] ; Hemoptysis[ ] ; Sputum ; Snoring   GI: Vomiting[ ] ; Dysphagia[ ] ; Melena[ ] ; Hematochezia ; Heartburn[ ] ; Abdominal pain ; Constipation ; Diarrhea ; BRBPR   GU: Hematuria[ ] ; Dysuria ; Nocturia[ ]   Vascular: Pain in legs with walking ; Pain in feet with lying flat ; Non-healing sores ; Stroke ; TIA ; Slurred speech ;  Neuro: Headaches[ ] ; Vertigo[ ] ; Seizures[ ] ; Paresthesias[ ] ;Blurred vision ; Diplopia  ; Vision changes   Ortho/Skin: Arthritis Cove.Etienne ]; Joint pain Cove.Etienne ]; Muscle pain ; Joint swelling ; Back Pain ; Rash   Psych: Depression[ ] ; Anxiety[ ]   Heme: Bleeding problems ; Clotting disorders ; Anemia   Endocrine: Diabetes ; Thyroid dysfunction[ ]   Home Medications Prior to Admission medications   Medication Sig Start Date End Date Taking? Authorizing Provider  allopurinol (ZYLOPRIM) 100 MG tablet Take 100 mg by mouth daily.   Yes Historical Provider, MD  aspirin 81 MG tablet Take 81 mg by mouth daily.   Yes Historical Provider, MD  carvedilol (COREG) 12.5 MG tablet Take 1 tablet (12.5 mg total) by mouth 2 (two) times daily with a meal. 08/08/15  Yes Clydia Llano, MD  ferrous sulfate 325 (65 FE) MG tablet Take 325 mg by mouth daily with breakfast.   Yes Historical Provider, MD  Potassium Chloride ER 20 MEQ TBCR Take 40 mEq by mouth 2 (two) times daily. 10/04/15  Yes Laurey Morale, MD  torsemide (DEMADEX) 20 MG tablet Take 2 tablets (40 mg total) by mouth 2 (two) times daily. Take two 20 mg tablets twice a day Patient taking differently: Take 2-40 mg by mouth See admin instructions.  in am,  in pm 10/04/15  Yes Laurey Morale, MD  verapamil (CALAN-SR) 180 MG CR tablet Take 180 mg by  mouth daily. 09/06/15  Yes Historical Provider, MD    Past Medical History: Past Medical History  Diagnosis Date  . Hypertension   . Bradycardia     s/p pacemaker in 2010  . Chronic kidney disease   . Gout   . Pacemaker 2010  . Arthritis     OSTEO  . Gout   . CHF (congestive heart failure) (HCC)     Past Surgical History: Past Surgical History  Procedure Laterality Date  . Pacemaker insertion      2010  . Knee surgery    . Abdominal hysterectomy      at age 62  . Insert / replace / remove pacemaker  2010  . Left heart catheterization with coronary angiogram N/A 11/06/2014    Procedure: LEFT HEART CATHETERIZATION WITH CORONARY ANGIOGRAM;  Surgeon: Lennette Bihari, MD;  Location: [facility] CATH LAB;  Service: Cardiovascular;  Laterality: N/A;  . Cardiac catheterization N/A 09/19/2015    Procedure: Right Heart Cath;  Surgeon: Laurey Morale, MD;  Location: [facility] INVASIVE CV LAB;  Service: Cardiovascular;  Laterality: N/A;    Family History: Family History  Problem Relation Age of Onset  . Hypertension Mother   . CAD Sister     unkown age    Social History: Social History   Social History  . Marital Status: Widowed    Spouse Name: N/A  . Number of Children: N/A  . Years of Education: N/A   Social History Main Topics  . Smoking status: Never Smoker   . Smokeless tobacco: Never Used  . Alcohol Use: No  . Drug Use: No  . Sexual Activity: Not Asked   Other Topics Concern  . None   Social History Narrative    Allergies:  Allergies  Allergen Reactions  . Codeine Itching    Objective:    Vital Signs:   Temp:  [98.7 F (37.1 C)] 98.7 F (37.1 C) (12/15 1256) Pulse Rate:  [42-213] 70 (12/15 1434) Resp:  [14-18] 15 (12/15 1434) BP: (128-163)/(67-87) 163/79 mmHg (12/15 1434) SpO2:  [89 %-99 %] 99 % (12/15 1434)    Weight change: There were no vitals filed for this visit.  Intake/Output:  No intake or output data in the 24 hours ending 11/22/15 1547   Physical Exam: General:  Elderly woman lying in bed. Dyspneic at rest HEENT: normal x for poor dentition Neck: supple. JVP to ear . Carotids 2+ bilat; no bruits. No lymphadenopathy or thyromegaly appreciated. Cor: PMI nondisplaced. Regular rate & rhythm. 2/6 TR  Lungs: basilar crackles  Abdomen: soft, nontender, ++ distended. No hepatosplenomegaly. No bruits or masses. Good bowel sounds. Extremities: no cyanosis, clubbing, rash, 3+  edema Neuro: alert & orientedx3, cranial nerves grossly intact. moves all 4 extremities w/o difficulty. Affect pleasant  Telemetry: AV pacing  Labs: Basic Metabolic Panel:  Recent Labs Lab 11/22/15 1308  NA 142  K 3.7  CL 104  CO2 29   GLUCOSE 95  BUN 38*  CREATININE 2.34*  CALCIUM 9.2    Liver Function Tests: No results for input(s): AST, ALT, ALKPHOS, BILITOT, PROT, ALBUMIN in the last 168 hours. No results for input(s): LIPASE, AMYLASE in the last 168 hours. No results for input(s): AMMONIA in the last 168 hours.  CBC:  Recent Labs Lab 11/22/15 1308  WBC 6.9  HGB 10.2*  HCT 32.5*  MCV 91.0  PLT 130*    Cardiac Enzymes: No results for input(s): CKTOTAL, CKMB, CKMBINDEX, TROPONINI in the last  168 hours.  BNP: BNP (last 3 results)  Recent Labs  09/11/15 1726 10/03/15 1259 11/22/15 1308  BNP 1228.8* 662.7* 1370.0*    ProBNP (last 3 results) No results for input(s): PROBNP in the last 8760 hours.   CBG: No results for input(s): GLUCAP in the last 168 hours.  Coagulation Studies: No results for input(s): LABPROT, INR in the last 72 hours.  Other results: EKG: AVpaced 67  with PVCs  Imaging: Dg Chest 2 View  11/22/2015  CLINICAL DATA:  Shortness of Breath EXAM: CHEST  2 VIEW COMPARISON:  September 20, 2015 FINDINGS: There is no edema or consolidation. Heart is enlarged, stable. Prominence of the main pulmonary outflow tract is also a stable finding. There appears to be normal tapering of the pulmonary arteries more distally. Pacemaker leads are attached to the right atrium and middle cardiac vein. No pneumothorax. No apparent adenopathy. There is arthropathy in both shoulders as well as degenerative change in the thoracic spine. IMPRESSION: Cardiomegaly. Prominence of the main pulmonary outflow tract potentially may indicate a degree of pulmonary arterial hypertension. No edema or consolidation. Stable pacemaker lead positions. 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 Electronically Signed   By: Bretta BangWilliam  Woodruff III M.D.   On: 11/22/2015 13:49      Medications:     Current Medications: . furosemide  20 mg Intravenous Once  . furosemide  40 mg Intravenous STAT     Infusions:       Assessment/Plan   Mady HaagensenVenna Camilo is a 74 y.o. female with history of diastolic CHF Echo 09/18/15 EF 30-86%60-65% with grade 2 DD, normal RV, PA peak pressure 73 mm Hg, HTN, gout, and bradycardia s/p Medtronic dual chamber pacemaker in 2010 [facility] worsening SOB and pedal edema. HF team consulted for aggressive diuresis and because she follows in the HF clinic.  1) Acute on chronic diastolic HF 2) Acute respiratory failure 3) SSS s/p MDT PM 4) Pulmonary HTN   Length of Stay: 0  [name] 11/22/2015, 3:47 PM  Advanced Heart Failure Team Pager [phone] (M-F; 7a - 4p)  Please contact [facility] for night-coverage after hours (4p -7a ) and weekends on [email or web address]  Patient seen and examined with Otilio SaberAndy Tillery, PA-C. We discussed all aspects of the encounter. I agree with the assessment and plan as stated above.   She is profoundly volume overloaded likely 30-40 pounds on board in the setting of inadequate diuretic dosing. Respiratory status is tenuous. Agree with admission for IV diuresis with lasix 80 IV bid. Would also give daily metolazone 2.5 mg. Watch electrolytes and respiratory status closely. Place Foley for now given very limited mobility.   Bensimhon, Daniel,MD 4:16 PM

## 2015-11-22 NOTE — Progress Notes (Signed)
Patient arrived in the unit accompanied by NT via bed. Patient on O2 2L with SOB. O2  100%,. Orientation given to the unit. Patient verbalized understanding.

## 2015-11-22 NOTE — H&P (Signed)
[facility] History and Physical  Anita Wiley ZOX:[id number] DOB: [birth year] DOA: 11/22/2015  Referring physician: nadeau PCP: Angela Adam, MD   Chief Complaint: sob, worsening LEE  HPI: Anita Wiley is a pleasant 74 y.o. female past medical history that includes CHF, hypertension, CAD, presents to the emergency department with chief complaint of gradual worsening shortness of breath and lower extremity edema. Initial evaluation in the emergency department reveals acute respiratory failure related to acute on chronic heart failure.  Patient reports last 2-3 weeks gradual onset of worsening shortness of breath and lower extremity edema. Daughter is at bedside and said patient was discharged yesterday from a rehabilitation facility after 3 weeks day from her prior hospitalization. Remote hospitalization patient was evaluated by Dr. Ronelle Nigh of the heart failure team. Daughter believes at discharge Dr. Shirlee Latch wanted patient to be on torsemide but she received Lasix at the facility. She reports over to 3 week. Gradual worsening of lower extremity edema and shortness of breath. Patient reports worsening orthopnea requiring 3 pillows instead of her usual 2. She denies chest pain palpitation fever chills headache dizziness syncope or near-syncope. She denies any abdominal pain nausea vomiting.  In the emergency department oxygen saturation level 98% on room air (she does not require oxygen at home), elevated BNP, she is afebrile hemodynamically stable.  The ED she is given nitro paste and 40 mg of Lasix intravenously  Review of Systems:  10 point review of systems complete and all systems are negative except as indicated in the history of present illness  Past Medical History  Diagnosis Date  . Hypertension   . Bradycardia     s/p pacemaker in 2010  . Chronic kidney disease   . Gout   . Pacemaker 2010  . Arthritis     OSTEO  . Gout   . CHF (congestive heart failure) [facility])    Past  Surgical History  Procedure Laterality Date  . Pacemaker insertion      2010  . Knee surgery    . Abdominal hysterectomy      at age 35  . Insert / replace / remove pacemaker  2010  . Left heart catheterization with coronary angiogram N/A 11/06/2014    Procedure: LEFT HEART CATHETERIZATION WITH CORONARY ANGIOGRAM;  Surgeon: Lennette Bihari, MD;  Location: [facility] CATH LAB;  Service: Cardiovascular;  Laterality: N/A;  . Cardiac catheterization N/A 09/19/2015    Procedure: Right Heart Cath;  Surgeon: Laurey Morale, MD;  Location: [facility] INVASIVE CV LAB;  Service: Cardiovascular;  Laterality: N/A;   Social History:  reports that she has never smoked. She has never used smokeless tobacco. She reports that she does not drink alcohol or use illicit drugs. She recently discharged from rehabilitation facility after 3 weeks day from her previous hospitalization a month ago. She staying with her daughter. She uses a all her no recent falls fairly independent with ADLs. Able to make wants and needs known Allergies  Allergen Reactions  . Codeine Itching    Family History  Problem Relation Age of Onset  . Hypertension Mother   . CAD Sister     unkown age     Prior to Admission medications   Medication Sig Start Date End Date Taking? Authorizing Provider  allopurinol (ZYLOPRIM) 100 MG tablet Take 100 mg by mouth daily.   Yes Historical Provider, MD  aspirin 81 MG tablet Take 81 mg by mouth daily.   Yes Historical Provider, MD  carvedilol (COREG) 12.5 MG tablet  Take 1 tablet (12.5 mg total) by mouth 2 (two) times daily with a meal. 08/08/15  Yes Clydia LlanoMutaz Elmahi, MD  ferrous sulfate 325 (65 FE) MG tablet Take 325 mg by mouth daily with breakfast.   Yes Historical Provider, MD  Potassium Chloride ER 20 MEQ TBCR Take 40 mEq by mouth 2 (two) times daily. 10/04/15  Yes Laurey Moralealton S McLean, MD  torsemide (DEMADEX) 20 MG tablet Take 2 tablets (40 mg total) by mouth 2 (two) times daily. Take two 20 mg tablets twice a  day Patient taking differently: Take 2-40 mg by mouth See admin instructions. 40mg  in am, 20mg  in pm 10/04/15  Yes Laurey Moralealton S McLean, MD  verapamil (CALAN-SR) 180 MG CR tablet Take 180 mg by mouth daily. 09/06/15  Yes Historical Provider, MD   Physical Exam: Filed Vitals:   11/22/15 1351 11/22/15 1353 11/22/15 1430 11/22/15 1434  BP: 128/67  163/79 163/79  Pulse:  42 213 70  Temp:      TempSrc:      Resp: 14 16 16 15   SpO2:  98% 89% 99%    Wt Readings from Last 3 Encounters:  10/03/15 82.01 kg (180 lb 12.8 oz)  09/22/15 85.367 kg (188 lb 3.2 oz)  08/08/15 86.546 kg (190 lb 12.8 oz)    General:  Appears calm and comfortable, obese Eyes: PERRL, normal lids, irises & conjunctiva ENT: grossly normal hearing, lips & tongue mucous membranes of her mouth are pink and moist Neck: no LAD, masses or thyromegaly Cardiovascular: RRR, no m/r/g. 2+ lower extremity pitting edema bilaterally right greater than left up to just above the knees  Respiratory: Mild increased work of breathing with conversation. Uses a dominant role accessory muscles breath sounds quite diminished throughout on crackles bases Abdomen: soft, ntnd, obese positive bowel sounds no guarding or rebounding Skin: no rash or induration seen on limited exam Musculoskeletal: grossly normal tone BUE/BLE Psychiatric: grossly normal mood and affect, speech fluent and appropriate Neurologic: grossly non-focal. speech clear facial symmetry somewhat hard of hearing follows commands           Labs on Admission:  Basic Metabolic Panel:  Recent Labs Lab 11/22/15 1308  NA 142  K 3.7  CL 104  CO2 29  GLUCOSE 95  BUN 38*  CREATININE 2.34*  CALCIUM 9.2   Liver Function Tests: No results for input(s): AST, ALT, ALKPHOS, BILITOT, PROT, ALBUMIN in the last 168 hours. No results for input(s): LIPASE, AMYLASE in the last 168 hours. No results for input(s): AMMONIA in the last 168 hours. CBC:  Recent Labs Lab 11/22/15 1308  WBC  6.9  HGB 10.2*  HCT 32.5*  MCV 91.0  PLT 130*   Cardiac Enzymes: No results for input(s): CKTOTAL, CKMB, CKMBINDEX, TROPONINI in the last 168 hours.  BNP (last 3 results)  Recent Labs  09/11/15 1726 10/03/15 1259 11/22/15 1308  BNP 1228.8* 662.7* 1370.0*    ProBNP (last 3 results) No results for input(s): PROBNP in the last 8760 hours.  CBG: No results for input(s): GLUCAP in the last 168 hours.  Radiological Exams on Admission: Dg Chest 2 View  11/22/2015  CLINICAL DATA:  Shortness of Breath EXAM: CHEST  2 VIEW COMPARISON:  September 20, 2015 FINDINGS: There is no edema or consolidation. Heart is enlarged, stable. Prominence of the main pulmonary outflow tract is also a stable finding. There appears to be normal tapering of the pulmonary arteries more distally. Pacemaker leads are attached to the right atrium and  middle cardiac vein. No pneumothorax. No apparent adenopathy. There is arthropathy in both shoulders as well as degenerative change in the thoracic spine. IMPRESSION: Cardiomegaly. Prominence of the main pulmonary outflow tract potentially may indicate a degree of pulmonary arterial hypertension. No edema or consolidation. Stable pacemaker lead positions. 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 Electronically Signed   By: Bretta Bang III M.D.   On: 11/22/2015 13:49    EKG: Independently reviewed. Ventricular-paced rhythm with Premature atrial complexes with Abberant conduction  Assessment/Plan Principal Problem:   Acute respiratory failure (HCC) Active Problems:   S/P placement of cardiac pacemaker   HTN (hypertension)   Acute renal failure superimposed on stage 3 chronic kidney disease (HCC)   Coronary artery disease, non-occlusive by cath   CHF exacerbation (HCC)  #1. Acute respiratory failure secondary to acute on chronic combination diastolic/systolic heart failure. May be related to confusion around medications at last discharge. Chart review indicates  patient discharged on Demadex but daughter believes she was receiving Lasix at the rehabilitation facility. -Admit to telemetry -Continue IV Lasix -Oxygen supplementation as indicated -Monitor intake and output -Obtain daily weights -Continue beta blocker from home. -Of note patient admitted in October of this year with same and required Lasix drip for optimal diuresis. -Request heart failure team consult -Of note chart review indicates discharge weight 188 pounds her weight on October 26 was 178 pounds  #2. Acute on chronic renal failure superimposed on stage III chronic kidney disease. Likely related above -Monitor urine output -Hold nephrotoxins as able -Current creatinine is close to baseline  #3. Acute on chronic diastolic heart failure. Elevated BNP -Chart review indicates EF 50-60% with grade 3 diastolic dysfunction -Will provide IV Lasix -Request heart failure consult for management of aggressive diuresis  -See #1  #4. Hypertension. Controlled in the emergency department and on high-end of normal -Continue home medications -Monitor closely  #5. history of symptomatic bradycardia status post Medtronic dual-chamber PPM -EKG as noted above -Review indicatescath in November 2015 was normal coronaries   Heart failure team  Code Status: DNR DVT Prophylaxis: Family Communication: daughter at bedside Disposition Plan: home  Time spent: 55 minutes  [facility] M [facility]

## 2015-11-23 DIAGNOSIS — I5043 Acute on chronic combined systolic (congestive) and diastolic (congestive) heart failure: Secondary | ICD-10-CM | POA: Diagnosis present

## 2015-11-23 DIAGNOSIS — M2550 Pain in unspecified joint: Secondary | ICD-10-CM | POA: Diagnosis not present

## 2015-11-23 DIAGNOSIS — J9601 Acute respiratory failure with hypoxia: Secondary | ICD-10-CM | POA: Diagnosis not present

## 2015-11-23 DIAGNOSIS — E876 Hypokalemia: Secondary | ICD-10-CM | POA: Diagnosis not present

## 2015-11-23 DIAGNOSIS — N183 Chronic kidney disease, stage 3 (moderate): Secondary | ICD-10-CM | POA: Diagnosis not present

## 2015-11-23 DIAGNOSIS — N184 Chronic kidney disease, stage 4 (severe): Secondary | ICD-10-CM

## 2015-11-23 DIAGNOSIS — I5033 Acute on chronic diastolic (congestive) heart failure: Secondary | ICD-10-CM | POA: Diagnosis not present

## 2015-11-23 DIAGNOSIS — Z66 Do not resuscitate: Secondary | ICD-10-CM | POA: Diagnosis not present

## 2015-11-23 DIAGNOSIS — I272 Other secondary pulmonary hypertension: Secondary | ICD-10-CM | POA: Diagnosis present

## 2015-11-23 DIAGNOSIS — I480 Paroxysmal atrial fibrillation: Secondary | ICD-10-CM | POA: Diagnosis not present

## 2015-11-23 DIAGNOSIS — R29898 Other symptoms and signs involving the musculoskeletal system: Secondary | ICD-10-CM | POA: Diagnosis not present

## 2015-11-23 DIAGNOSIS — R0602 Shortness of breath: Secondary | ICD-10-CM | POA: Diagnosis present

## 2015-11-23 DIAGNOSIS — N179 Acute kidney failure, unspecified: Secondary | ICD-10-CM | POA: Diagnosis not present

## 2015-11-23 DIAGNOSIS — J9620 Acute and chronic respiratory failure, unspecified whether with hypoxia or hypercapnia: Secondary | ICD-10-CM | POA: Diagnosis present

## 2015-11-23 DIAGNOSIS — I10 Essential (primary) hypertension: Secondary | ICD-10-CM | POA: Diagnosis not present

## 2015-11-23 DIAGNOSIS — I509 Heart failure, unspecified: Secondary | ICD-10-CM | POA: Diagnosis not present

## 2015-11-23 DIAGNOSIS — Z79899 Other long term (current) drug therapy: Secondary | ICD-10-CM | POA: Diagnosis not present

## 2015-11-23 DIAGNOSIS — Z7982 Long term (current) use of aspirin: Secondary | ICD-10-CM | POA: Diagnosis not present

## 2015-11-23 DIAGNOSIS — Z950 Presence of cardiac pacemaker: Secondary | ICD-10-CM | POA: Diagnosis not present

## 2015-11-23 DIAGNOSIS — I2510 Atherosclerotic heart disease of native coronary artery without angina pectoris: Secondary | ICD-10-CM | POA: Diagnosis present

## 2015-11-23 DIAGNOSIS — M109 Gout, unspecified: Secondary | ICD-10-CM | POA: Diagnosis present

## 2015-11-23 DIAGNOSIS — D509 Iron deficiency anemia, unspecified: Secondary | ICD-10-CM | POA: Diagnosis present

## 2015-11-23 DIAGNOSIS — R1013 Epigastric pain: Secondary | ICD-10-CM | POA: Diagnosis not present

## 2015-11-23 DIAGNOSIS — K869 Disease of pancreas, unspecified: Secondary | ICD-10-CM | POA: Diagnosis not present

## 2015-11-23 DIAGNOSIS — I130 Hypertensive heart and chronic kidney disease with heart failure and stage 1 through stage 4 chronic kidney disease, or unspecified chronic kidney disease: Secondary | ICD-10-CM | POA: Diagnosis present

## 2015-11-23 LAB — BASIC METABOLIC PANEL
Anion gap: 8 (ref 5–15)
BUN: 39 mg/dL — AB (ref 6–20)
CHLORIDE: 103 mmol/L (ref 101–111)
CO2: 29 mmol/L (ref 22–32)
CREATININE: 2.41 mg/dL — AB (ref 0.44–1.00)
Calcium: 8.9 mg/dL (ref 8.9–10.3)
GFR calc Af Amer: 21 mL/min — ABNORMAL LOW (ref >60)
GFR calc non Af Amer: 18 mL/min — ABNORMAL LOW (ref >60)
GLUCOSE: 82 mg/dL (ref 65–99)
Potassium: 3.7 mmol/L (ref 3.5–5.1)
Sodium: 140 mmol/L (ref 135–145)

## 2015-11-23 LAB — TROPONIN I
Troponin I: 0.04 ng/mL — ABNORMAL HIGH (ref <0.031)
Troponin I: 0.05 ng/mL — ABNORMAL HIGH (ref <0.031)

## 2015-11-23 LAB — MAGNESIUM: Magnesium: 1.9 mg/dL (ref 1.7–2.4)

## 2015-11-23 LAB — GLUCOSE, CAPILLARY: Glucose-Capillary: 79 mg/dL (ref 65–99)

## 2015-11-23 MED ORDER — CETYLPYRIDINIUM CHLORIDE 0.05 % MT LIQD
7.0000 mL | Freq: Two times a day (BID) | OROMUCOSAL | Status: DC
  Administered 2015-11-23 – 2015-12-05 (×21): 7 mL via OROMUCOSAL

## 2015-11-23 MED ORDER — FUROSEMIDE 10 MG/ML IJ SOLN
20.0000 mg/h | INTRAVENOUS | Status: DC
  Administered 2015-11-23 – 2015-11-25 (×2): 10 mg/h via INTRAVENOUS
  Administered 2015-11-26 – 2015-11-29 (×4): 15 mg/h via INTRAVENOUS
  Administered 2015-11-29 – 2015-12-03 (×6): 20 mg/h via INTRAVENOUS
  Filled 2015-11-23 (×30): qty 25

## 2015-11-23 NOTE — Progress Notes (Addendum)
[facility] Progress Note   Antonetta Clanton  RUE:[id number]  DOB: 13-Sep-1941  DOA: 11/22/2015 PCP: Angela Adam, MD  Brief narrative: Anita Wiley is a 74 y.o. female with diastolic heart failure, chronic kidney disease stage IV, hypertension, pacemaker who presents to the hospital for shortness of breath and is found to have acute diastolic heart failure. He was discharged home from a rehabilitation facility just yesterday and although furosemide was changed to torsemide on discharge, she continued to receive furosemide over the past 3 weeks and subsequently became volume overloaded.   Subjective: No shortness of breath at rest. Mild dry cough. No chest pain, no palpitations  Assessment/Plan: Principal Problem:   Acute respiratory failure/ Acute on chronic diastolic heart failure -Heart failure team assisting with management  Active Problems:   S/P placement of cardiac pacemaker -Status post interrogation this morning-has had occasional episodes of atrial fibrillation with controlled ventricular rate    HTN (hypertension) -Continue carvedilol and verapamil  Chronic kidney disease stage IV -Follow creatinine with diuretics  Coronary artery disease, non-occlusive by cath --Continue aspirin  Gout --Continue allopurinol  Iron deficiency anemia --Continue ferrous sulfate   Code Status:     Code Status Orders        Start     Ordered   11/22/15 1707  Do not attempt resuscitation (DNR)   Continuous    Question Answer Comment  In the event of cardiac or respiratory ARREST Do not call a "code blue"   In the event of cardiac or respiratory ARREST Do not perform Intubation, CPR, defibrillation or ACLS   In the event of cardiac or respiratory ARREST Use medication by any route, position, wound care, and other measures to relive pain and suffering. May use oxygen, suction and manual treatment of airway obstruction as needed for comfort.      11/22/15 1706     Family  Communication:  Disposition Plan: PT eval DVT prophylaxis: Lovenox Consultants: Heart Failure team  Antibiotics: Anti-infectives    None      Objective: Filed Weights   11/22/15 1643 11/23/15 0536 11/23/15 0900  Weight: 98.521 kg (217 lb 3.2 oz) 105.008 kg (231 lb 8 oz) 97.9 kg (215 lb 13.3 oz)    Intake/Output Summary (Last 24 hours) at 11/23/15 1312 Last data filed at 11/23/15 1111  Gross per 24 hour  Intake    663 ml  Output   1600 ml  Net   -937 ml     Vitals Filed Vitals:   11/23/15 0536 11/23/15 0900 11/23/15 0944 11/23/15 1112  BP: 112/55   112/51  Pulse: 68  61 66  Temp: 97.9 F (36.6 C)   97.9 F (36.6 C)  TempSrc: Oral   Oral  Resp: 18   16  Height:      Weight: 105.008 kg (231 lb 8 oz) 97.9 kg (215 lb 13.3 oz)    SpO2: 100%  100% 100%    Exam:  General:  Pt is alert, not in acute distress  HEENT: No icterus, No thrush, oral mucosa moist  Cardiovascular: regular rate and rhythm, S1/S2 No murmur  Respiratory: : Crackles at bilateral bases-oxygen saturation 100% on 2 L of oxygen  Abdomen: Soft, +Bowel sounds, non tender, non distended, no guarding  MSK: No cyanosis or clubbing + pitting edema  Data Reviewed: Basic Metabolic Panel:  Recent Labs Lab 11/22/15 1308 11/23/15 0602  NA 142 140  K 3.7 3.7  CL 104 103  CO2 29 29  GLUCOSE  95 82  BUN 38* 39*  CREATININE 2.34* 2.41*  CALCIUM 9.2 8.9  MG  --  1.9   Liver Function Tests: No results for input(s): AST, ALT, ALKPHOS, BILITOT, PROT, ALBUMIN in the last 168 hours. No results for input(s): LIPASE, AMYLASE in the last 168 hours. No results for input(s): AMMONIA in the last 168 hours. CBC:  Recent Labs Lab 11/22/15 1308  WBC 6.9  HGB 10.2*  HCT 32.5*  MCV 91.0  PLT 130*   Cardiac Enzymes:  Recent Labs Lab 11/22/15 1830 11/22/15 2357 11/23/15 0602  TROPONINI 0.05* 0.04* 0.05*   BNP (last 3 results)  Recent Labs  09/11/15 1726 10/03/15 1259 11/22/15 1308  BNP  1228.8* 662.7* 1370.0*    ProBNP (last 3 results) No results for input(s): PROBNP in the last 8760 hours.  CBG:  Recent Labs Lab 11/22/15 2128 11/23/15 0659  GLUCAP 175* 79    No results found for this or any previous visit (from the past 240 hour(s)).   Studies: Dg Chest 2 View  11/22/2015  CLINICAL DATA:  Shortness of Breath EXAM: CHEST  2 VIEW COMPARISON:  September 20, 2015 FINDINGS: There is no edema or consolidation. Heart is enlarged, stable. Prominence of the main pulmonary outflow tract is also a stable finding. There appears to be normal tapering of the pulmonary arteries more distally. Pacemaker leads are attached to the right atrium and middle cardiac vein. No pneumothorax. No apparent adenopathy. There is arthropathy in both shoulders as well as degenerative change in the thoracic spine. IMPRESSION: Cardiomegaly. Prominence of the main pulmonary outflow tract potentially may indicate a degree of pulmonary arterial hypertension. No edema or consolidation. Stable pacemaker lead positions. 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 Electronically Signed   By: Bretta BangWilliam  Woodruff III M.D.   On: 11/22/2015 13:49    Scheduled Meds:  Scheduled Meds: . allopurinol  100 mg Oral Daily  . antiseptic oral rinse  7 mL Mouth Rinse BID  . aspirin EC  81 mg Oral Daily  . carvedilol  12.5 mg Oral BID WC  . enoxaparin (LOVENOX) injection  30 mg Subcutaneous Q24H  . ferrous sulfate  325 mg Oral Q breakfast  . metolazone  2.5 mg Oral Daily  . potassium chloride SA  40 mEq Oral BID  . sodium chloride  3 mL Intravenous Q12H  . verapamil  180 mg Oral Daily   Continuous Infusions: . furosemide (LASIX) infusion 10 mg/hr (11/23/15 0945)    Time spent on care of this patient: 35 min   Shiya Fogelman, MD 11/23/2015, 1:12 PM  LOS: 1 day   [facility] Office  [phone] Pager - Text Page per [email or web address] If 7PM-7AM, please contact night-coverage [email or web address]

## 2015-11-23 NOTE — Progress Notes (Signed)
Advanced Heart Failure Rounding Note  PCP: @PCPNAME @ Primary Cardiologist:   Subjective:    States breathing is better this morning.  Was weighed once she arrived to 3E last night, and again this morning (with 15 lb difference). No CP. Denies lightheaded/dizziness.   Only out ~ 600 cc so far. Weight unclear, but at least 30 lbs up from baseline.  Will have reweigh.   Objective:   Weight Range: 231 lb 8 oz (105.008 kg) Body mass index is 39.72 kg/(m^2).   Vital Signs:   Temp:  [97.5 F (36.4 C)-98.7 F (37.1 C)] 97.9 F (36.6 C) (12/16 0536) Pulse Rate:  [42-213] 68 (12/16 0536) Resp:  [14-18] 18 (12/16 0536) BP: (112-163)/(52-99) 112/55 mmHg (12/16 0536) SpO2:  [89 %-100 %] 100 % (12/16 0536) Weight:  [217 lb 3.2 oz (98.521 kg)-231 lb 8 oz (105.008 kg)] 231 lb 8 oz (105.008 kg) (12/16 0536) Last BM Date: 11/22/15  Weight change: Filed Weights   11/22/15 1643 11/23/15 0536  Weight: 217 lb 3.2 oz (98.521 kg) 231 lb 8 oz (105.008 kg)    Intake/Output:   Intake/Output Summary (Last 24 hours) at 11/23/15 [id number] Last data filed at 11/23/15 0757  Gross per 24 hour  Intake    423 ml  Output   1000 ml  Net   -577 ml     Physical Exam: General: Elderly woman lying in bed. No acute distress HEENT: normal x for poor dentition, 02 3 lpm via [place]. Neck: supple. JVP to ear. Carotids 2+ bilat; no bruits. No thyromegaly or nodule noted.  Cor: PMI nondisplaced. RRR. 2/6 TR, widely split S2.  Lungs: Bibasilar crackles, mildly increase WOB Abdomen: Obese soft, NT, moderately distended, no HSM. No bruits or masses. +BS  Extremities: no cyanosis, clubbing, rash, 3+edema Neuro: alert & orientedx3, cranial nerves grossly intact. moves all 4 extremities w/o difficulty. Affect pleasant  Telemetry: AV paced in 60s  Labs: CBC  Recent Labs  11/22/15 1308  WBC 6.9  HGB 10.2*  HCT 32.5*  MCV 91.0  PLT 130*   Basic Metabolic Panel  Recent Labs  11/22/15 1308 11/23/15 0602  NA  142 140  K 3.7 3.7  CL 104 103  CO2 29 29  GLUCOSE 95 82  BUN 38* 39*  CREATININE 2.34* 2.41*  CALCIUM 9.2 8.9  MG  --  1.9   Liver Function Tests No results for input(s): AST, ALT, ALKPHOS, BILITOT, PROT, ALBUMIN in the last 72 hours. No results for input(s): LIPASE, AMYLASE in the last 72 hours. Cardiac Enzymes  Recent Labs  11/22/15 1830 11/22/15 2357 11/23/15 0602  TROPONINI 0.05* 0.04* 0.05*    BNP: BNP (last 3 results)  Recent Labs  09/11/15 1726 10/03/15 1259 11/22/15 1308  BNP 1228.8* 662.7* 1370.0*    ProBNP (last 3 results) No results for input(s): PROBNP in the last 8760 hours.   D-Dimer No results for input(s): DDIMER in the last 72 hours. Hemoglobin A1C No results for input(s): HGBA1C in the last 72 hours. Fasting Lipid Panel No results for input(s): CHOL, HDL, LDLCALC, TRIG, CHOLHDL, LDLDIRECT in the last 72 hours. Thyroid Function Tests No results for input(s): TSH, T4TOTAL, T3FREE, THYROIDAB in the last 72 hours.  Invalid input(s): FREET3  Other results:     Imaging/Studies:  Dg Chest 2 View  11/22/2015  CLINICAL DATA:  Shortness of Breath EXAM: CHEST  2 VIEW COMPARISON:  September 20, 2015 FINDINGS: There is no edema or consolidation. Heart is enlarged, stable. Prominence  of the main pulmonary outflow tract is also a stable finding. There appears to be normal tapering of the pulmonary arteries more distally. Pacemaker leads are attached to the right atrium and middle cardiac vein. No pneumothorax. No apparent adenopathy. There is arthropathy in both shoulders as well as degenerative change in the thoracic spine. IMPRESSION: Cardiomegaly. Prominence of the main pulmonary outflow tract potentially may indicate a degree of pulmonary arterial hypertension. No edema or consolidation. Stable pacemaker lead positions. 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 Electronically Signed   By: Bretta Bang III M.D.   On: 11/22/2015 13:49     Latest  Echo  Latest Cath   Medications:     Scheduled Medications: . allopurinol  100 mg Oral Daily  . antiseptic oral rinse  7 mL Mouth Rinse BID  . aspirin EC  81 mg Oral Daily  . carvedilol  12.5 mg Oral BID WC  . enoxaparin (LOVENOX) injection  30 mg Subcutaneous Q24H  . ferrous sulfate  325 mg Oral Q breakfast  . furosemide  80 mg Intravenous BID  . metolazone  2.5 mg Oral Daily  . potassium chloride SA  40 mEq Oral BID  . sodium chloride  3 mL Intravenous Q12H  . verapamil  180 mg Oral Daily     Infusions:     PRN Medications:  sodium chloride, acetaminophen, ondansetron (ZOFRAN) IV, sodium chloride   Assessment/Plan   Anita Wiley is a 74 y.o. female with history of diastolic CHF, HTN, gout, and bradycardia s/p Medtronic dual chamber pacemaker in 2010 who presented to [facility] with worsening SOB and pedal edema. HF team consulted with A/C diastolic CHF.   1. 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   She developed volume overload in the setting of getting the wrong diuretic and dose at a rehab facility recently. She remains markedly volume overloaded.  - Change to Lasix gtt and continue metolazone.  2. Acute on chronic respiratory failure - sats 89% yesterday.  Improved on 02 via [place]. 3. CKD stage IV: Monitor closely with diuresis 4. 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  5. 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 Continue to diurese. 6. HTN: BP stable on current meds.  Length of Stay: 1  Anita Freer PA-C 11/23/2015, 8:12 AM  Advanced Heart Failure Team Pager [phone] (M-F; 7a - 4p)  Please contact [facility] for night-coverage after hours (4p -7a ) and weekends on [email or web address]  Patient seen with PA, agree with the above note.  She is markedly volume overloaded in the setting of inadequate  diuretic dosing prior to admission.  She was in the hospital in [place] for a prolonged period in 11/16 but she cannot tell me why.   - Transition to Lasix gtt 10 mg/hr + metolazone 2.5 daily.  - Follow creatinine closely with CKD stage IV.  - Needs PT evaluation.   [name] 11/23/2015 12:03 PM

## 2015-11-23 NOTE — Evaluation (Signed)
Physical Therapy Evaluation Patient Details Name: Mady HaagensenVenna Lavalais [name]: [id number] DOB: 03/18/1941 Today's Date: 11/23/2015   History of Present Illness  74 y.o. female past medical history that includes CHF, hypertension, CAD, presents to the emergency department with chief complaint of gradual worsening shortness of breath and lower extremity edema. Initial evaluation in the emergency department reveals acute respiratory failure related to acute on chronic heart failure  Clinical Impression  Pt presents with decreased strenght, mobility and gait. Pt will benefit from skilled PT services to address deficits and increase functional mobility. Pt may need SNF at d/c as she lives alone and may not be able to safely d/c home alone.    Follow Up Recommendations SNF;Supervision/Assistance - 24 hour    Equipment Recommendations  None recommended by PT    Recommendations for Other Services       Precautions / Restrictions Precautions Precautions: Fall Restrictions Weight Bearing Restrictions: No      Mobility  Bed Mobility Overal bed mobility: Modified Independent             General bed mobility comments: increased time but able to perform with bedrails and HOB raised for supine to sit, flat for sit to supine  Transfers Overall transfer level: Needs assistance Equipment used: Rolling walker (2 wheeled) Transfers: Sit to/from Stand Sit to Stand: Min guard         General transfer comment: steadying assistance  Ambulation/Gait Ambulation/Gait assistance: Min guard Ambulation Distance (Feet): 30 Feet Assistive device: Rolling walker (2 wheeled)   Gait velocity: decreased   General Gait Details: increasing SOB with gait, 3/4 dyspnea, spO2 92% on 3L O2  Stairs            Wheelchair Mobility    Modified Rankin (Stroke Patients Only)       Balance                                             Pertinent Vitals/Pain Pain Assessment: No/denies  pain    Home Living Family/patient expects to be discharged to:: Private residence Living Arrangements: Alone Available Help at Discharge: Available PRN/intermittently;Family;Friend(s) Type of Home: Apartment Home Access: Stairs to enter Entrance Stairs-Rails: Can reach both Entrance Stairs-Number of Steps: 2 Home Layout: One level Home Equipment: Walker - 2 wheels      Prior Function Level of Independence: Independent with assistive device(s)               Hand Dominance        Extremity/Trunk Assessment               Lower Extremity Assessment: Generalized weakness      Cervical / Trunk Assessment: Kyphotic  Communication   Communication: No difficulties  Cognition Arousal/Alertness: Awake/alert Behavior During Therapy: WFL for tasks assessed/performed Overall Cognitive Status: Within Functional Limits for tasks assessed                      General Comments      Exercises        Assessment/Plan    PT Assessment Patient needs continued PT services  PT Diagnosis Difficulty walking;Generalized weakness   PT Problem List Decreased strength;Decreased activity tolerance;Decreased balance;Decreased mobility;Cardiopulmonary status limiting activity  PT Treatment Interventions DME instruction;Gait training;Stair training;Functional mobility training;Therapeutic activities;Patient/family education;Therapeutic exercise;Neuromuscular re-education;Balance training   PT Goals (Current goals can be found in  the Care Plan section) Acute Rehab PT Goals Patient Stated Goal: none stated PT Goal Formulation: With patient Time For Goal Achievement: 12/07/15 Potential to Achieve Goals: Good    Frequency Min 2X/week   Barriers to discharge Decreased caregiver support      Co-evaluation               End of Session Equipment Utilized During Treatment: Gait belt;Oxygen Activity Tolerance: Patient limited by fatigue Patient left: in bed;with call  bell/phone within reach;with bed alarm set Nurse Communication: Mobility status         Time: 1610-9604 PT Time Calculation (min) (ACUTE ONLY): 18 min   Charges:   PT Evaluation $Initial PT Evaluation Tier I: 1 Procedure     PT G Codes:        Charrise Lardner December 02, 2015, 2:50 PM

## 2015-11-24 DIAGNOSIS — Z950 Presence of cardiac pacemaker: Secondary | ICD-10-CM

## 2015-11-24 DIAGNOSIS — I2510 Atherosclerotic heart disease of native coronary artery without angina pectoris: Secondary | ICD-10-CM

## 2015-11-24 DIAGNOSIS — I10 Essential (primary) hypertension: Secondary | ICD-10-CM

## 2015-11-24 LAB — BASIC METABOLIC PANEL
Anion gap: 8 (ref 5–15)
BUN: 41 mg/dL — AB (ref 6–20)
CALCIUM: 8.9 mg/dL (ref 8.9–10.3)
CO2: 29 mmol/L (ref 22–32)
Chloride: 102 mmol/L (ref 101–111)
Creatinine, Ser: 2.44 mg/dL — ABNORMAL HIGH (ref 0.44–1.00)
GFR calc Af Amer: 21 mL/min — ABNORMAL LOW (ref >60)
GFR, EST NON AFRICAN AMERICAN: 18 mL/min — AB (ref >60)
GLUCOSE: 93 mg/dL (ref 65–99)
POTASSIUM: 3.9 mmol/L (ref 3.5–5.1)
SODIUM: 139 mmol/L (ref 135–145)

## 2015-11-24 MED ORDER — FERROUS SULFATE 325 (65 FE) MG PO TABS
325.0000 mg | ORAL_TABLET | Freq: Every day | ORAL | Status: DC
  Administered 2015-11-24 – 2015-12-05 (×12): 325 mg via ORAL
  Filled 2015-11-24 (×12): qty 1

## 2015-11-24 MED ORDER — CARVEDILOL 12.5 MG PO TABS
12.5000 mg | ORAL_TABLET | Freq: Two times a day (BID) | ORAL | Status: DC
  Administered 2015-11-24 – 2015-12-05 (×23): 12.5 mg via ORAL
  Filled 2015-11-24 (×27): qty 1

## 2015-11-24 NOTE — Progress Notes (Signed)
SUBJECTIVE: Feeling much better than yesterday, less short of breath. 1L urine output in bag, yet to be added to calculations.     Intake/Output Summary (Last 24 hours) at 11/24/15 1110 Last data filed at 11/24/15 0600  Gross per 24 hour  Intake  802.5 ml  Output    850 ml  Net  -47.5 ml    Current Facility-Administered Medications  Medication Dose Route Frequency Provider Last Rate Last Dose  . 0.9 %  sodium chloride infusion  250 mL Intravenous PRN Gwenyth Bender, NP      . acetaminophen (TYLENOL) tablet 650 mg  650 mg Oral Q4H PRN Gwenyth Bender, NP      . allopurinol (ZYLOPRIM) tablet 100 mg  100 mg Oral Daily Lesle Chris Black, NP   100 mg at 11/24/15 1031  . antiseptic oral rinse (CPC / CETYLPYRIDINIUM CHLORIDE 0.05%) solution 7 mL  7 mL Mouth Rinse BID Belkys A Regalado, MD   7 mL at 11/24/15 1032  . aspirin EC tablet 81 mg  81 mg Oral Daily Lesle Chris Black, NP   81 mg at 11/24/15 1031  . carvedilol (COREG) tablet 12.5 mg  12.5 mg Oral BID WC Calvert Cantor, MD   12.5 mg at 11/24/15 1031  . enoxaparin (LOVENOX) injection 30 mg  30 mg Subcutaneous Q24H Gwenyth Bender, NP   30 mg at 11/23/15 2017  . ferrous sulfate tablet 325 mg  325 mg Oral Q breakfast Calvert Cantor, MD   325 mg at 11/24/15 1031  . furosemide (LASIX) 250 mg in dextrose 5 % 250 mL (1 mg/mL) infusion  10 mg/hr Intravenous Continuous [name], PA-C 10 mL/hr at 11/23/15 0945 10 mg/hr at 11/23/15 0945  . metolazone (ZAROXOLYN) tablet 2.5 mg  2.5 mg Oral Daily [name], PA-C   2.5 mg at 11/24/15 1031  . ondansetron (ZOFRAN) injection 4 mg  4 mg Intravenous Q6H PRN Lesle Chris Black, NP      . potassium chloride SA (K-DUR,KLOR-CON) CR tablet 40 mEq  40 mEq Oral BID Gwenyth Bender, NP   40 mEq at 11/24/15 1031  . sodium chloride 0.9 % injection 3 mL  3 mL Intravenous Q12H Gwenyth Bender, NP   3 mL at 11/23/15 2212  . sodium chloride 0.9 % injection 3 mL  3 mL Intravenous PRN Gwenyth Bender, NP      .  verapamil (CALAN-SR) CR tablet 180 mg  180 mg Oral Daily Gwenyth Bender, NP   180 mg at 11/23/15 0944    Filed Vitals:   11/23/15 1446 11/23/15 1650 11/23/15 2004 11/24/15 0515  BP:  121/50 127/90 123/64  Pulse:  61 66 73  Temp:  97.8 F (36.6 C) 98.3 F (36.8 C) 98 F (36.7 C)  TempSrc:  Oral Oral Oral  Resp:  Height:      Weight:    213 lb 12.8 oz (96.979 kg)  SpO2: 92% 100% 100% 100%    PHYSICAL EXAM General: Elderly woman lying in bed. No acute distress HEENT: normal x for poor dentition, oxygen via [place]. Neck: supple. JVP to ear. No thyromegaly or nodule noted.  Cor: PMI nondisplaced. RRR. 2/6 TR, widely split S2.  Lungs: Bibasilar crackles, diminished throughout Abdomen: Obese soft, NT, moderately distended. Extremities: no cyanosis, clubbing, rash, 2+pitting pretibial edema Neuro: alert & orientedx3, cranial nerves grossly intact. moves all 4 extremities w/o difficulty. Affect  pleasant  TELEMETRY: Reviewed telemetry pt in AV pacing, 60 bpm range.  LABS: Basic Metabolic Panel:  Recent Labs  60/45/4012/16/16 0602 11/24/15 0344  NA 140 139  K 3.7 3.9  CL 103 102  CO2 29 29  GLUCOSE 82 93  BUN 39* 41*  CREATININE 2.41* 2.44*  CALCIUM 8.9 8.9  MG 1.9  --    Liver Function Tests: No results for input(s): AST, ALT, ALKPHOS, BILITOT, PROT, ALBUMIN in the last 72 hours. No results for input(s): LIPASE, AMYLASE in the last 72 hours. CBC:  Recent Labs  11/22/15 1308  WBC 6.9  HGB 10.2*  HCT 32.5*  MCV 91.0  PLT 130*   Cardiac Enzymes:  Recent Labs  11/22/15 1830 11/22/15 2357 11/23/15 0602  TROPONINI 0.05* 0.04* 0.05*   BNP: Invalid input(s): POCBNP D-Dimer: No results for input(s): DDIMER in the last 72 hours. Hemoglobin A1C: No results for input(s): HGBA1C in the last 72 hours. Fasting Lipid Panel: No results for input(s): CHOL, HDL, LDLCALC, TRIG, CHOLHDL, LDLDIRECT in the last 72 hours. Thyroid Function Tests: No results for input(s):  TSH, T4TOTAL, T3FREE, THYROIDAB in the last 72 hours.  Invalid input(s): FREET3 Anemia Panel: No results for input(s): VITAMINB12, FOLATE, FERRITIN, TIBC, IRON, RETICCTPCT in the last 72 hours.  RADIOLOGY: Dg Chest 2 View  11/22/2015  CLINICAL DATA:  Shortness of Breath EXAM: CHEST  2 VIEW COMPARISON:  September 20, 2015 FINDINGS: There is no edema or consolidation. Heart is enlarged, stable. Prominence of the main pulmonary outflow tract is also a stable finding. There appears to be normal tapering of the pulmonary arteries more distally. Pacemaker leads are attached to the right atrium and middle cardiac vein. No pneumothorax. No apparent adenopathy. There is arthropathy in both shoulders as well as degenerative change in the thoracic spine. IMPRESSION: Cardiomegaly. Prominence of the main pulmonary outflow tract potentially may indicate a degree of pulmonary arterial hypertension. No edema or consolidation. Stable pacemaker lead positions. 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 Electronically Signed   By: Bretta BangWilliam  Woodruff III M.D.   On: 11/22/2015 13:49      ASSESSMENT AND PLAN: 1. 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 She developed volume overload in the setting of getting the wrong diuretic and dose at a rehab facility recently. She remains markedly volume overloaded with elevated JVP to ear and pretibial edema, but is gradually improving.  - Continue Lasix gtt 10 mg/hr and metolazone 2.5 mg daily.   2. Acute on chronic respiratory failure - sats improving on O2 via [place].  3. CKD stage IV: Monitor closely with diuresis. BUN 41, creat 2.44 today.  4. 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 A-V sequential pacing with reported paroxysms of a fib   5. 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 Continue to diurese.  6. Essential HTN: BP stable on  current meds.   Prentice DockerSuresh Diron Haddon, M.D., F.A.C.C.

## 2015-11-24 NOTE — [place] FL2 (Signed)
[place] MEDICAID FL2 LEVEL OF CARE SCREENING TOOL     IDENTIFICATION  Patient Name: Anita Wiley Birthdate: 11/16/1941 Sex: female Admission Date (Current Location): 11/22/2015  [place]   Facility and Address:  [facility], [address], [place], [place] [address]      Provider Number: [id number]  Attending Physician Name and Address:  Marinda ElkAbraham Feliz Ortiz, MD  Relative Name and Phone Number:       Current Level of Care: Hospital Recommended Level of Care: Skilled Nursing Facility Prior Approval Number:    Date Approved/Denied:   PASRR Number:    Discharge Plan: SNF    Current Diagnoses: Patient Active Problem List   Diagnosis Date Noted  . CKD (chronic kidney disease) stage 4, GFR 15-29 ml/min (HCC) 11/23/2015  . Acute respiratory failure (HCC) 11/22/2015  . Acute on chronic diastolic heart failure (HCC) 11/22/2015  . Chronic diastolic CHF (congestive heart failure) (HCC) 10/03/2015  . CKD (chronic kidney disease), stage III 10/03/2015  . Acute on chronic congestive heart failure (HCC)   . Peripheral edema   . SOB (shortness of breath)   . CAP (community acquired pneumonia) 08/03/2015  . Acute respiratory distress (HCC) 08/03/2015  . Acute on chronic systolic congestive heart failure (HCC) 08/03/2015  . CHF exacerbation (HCC) 08/03/2015  . Gout   . NSTEMI (non-ST elevated myocardial infarction) (HCC) 11/06/2014  . Coronary artery disease, non-occlusive by cath 11/06/2014  . S/P placement of cardiac pacemaker   . HTN (hypertension)     Orientation RESPIRATION BLADDER Height & Weight    Self, Time, Situation, Place  O2 (2L) Continent 5\' 4"  (162.6 cm) 213 lbs.  BEHAVIORAL SYMPTOMS/MOOD NEUROLOGICAL BOWEL NUTRITION STATUS      Continent Diet (Heart Healthy)  AMBULATORY STATUS COMMUNICATION OF NEEDS Skin   Limited Assist Verbally Normal                       Personal Care Assistance Level of  Assistance  Bathing, Feeding, Dressing Bathing Assistance: Limited assistance Feeding assistance: Independent Dressing Assistance: Limited assistance     Functional Limitations Info  Sight, Hearing, Speech Sight Info: Adequate Hearing Info: Impaired Speech Info: Adequate    SPECIAL CARE FACTORS FREQUENCY  PT (By licensed PT), OT (By licensed OT)     PT Frequency: 3 OT Frequency: 3            Contractures Contractures Info: Not present    Additional Factors Info  Code Status, Allergies Code Status Info: DNR Allergies Info: Codeine           Current Medications (11/24/2015):  This is the current hospital active medication list Current Facility-Administered Medications  Medication Dose Route Frequency Provider Last Rate Last Dose  . 0.9 %  sodium chloride infusion  250 mL Intravenous PRN Gwenyth BenderKaren M Black, NP      . acetaminophen (TYLENOL) tablet 650 mg  650 mg Oral Q4H PRN Gwenyth BenderKaren M Black, NP      . allopurinol (ZYLOPRIM) tablet 100 mg  100 mg Oral Daily Lesle ChrisKaren M Black, NP   100 mg at 11/24/15 1031  . antiseptic oral rinse (CPC / CETYLPYRIDINIUM CHLORIDE 0.05%) solution 7 mL  7 mL Mouth Rinse BID Belkys A Regalado, MD   7 mL at 11/24/15 1032  . aspirin EC tablet 81 mg  81 mg Oral Daily Lesle ChrisKaren M Black, NP   81 mg at 11/24/15 1031  . carvedilol (  COREG) tablet 12.5 mg  12.5 mg Oral BID WC Calvert Cantor, MD   12.5 mg at 11/24/15 1031  . enoxaparin (LOVENOX) injection 30 mg  30 mg Subcutaneous Q24H Gwenyth Bender, NP   30 mg at 11/23/15 2017  . ferrous sulfate tablet 325 mg  325 mg Oral Q breakfast Calvert Cantor, MD   325 mg at 11/24/15 1031  . furosemide (LASIX) 250 mg in dextrose 5 % 250 mL (1 mg/mL) infusion  10 mg/hr Intravenous Continuous [name], PA-C 10 mL/hr at 11/23/15 0945 10 mg/hr at 11/23/15 0945  . metolazone (ZAROXOLYN) tablet 2.5 mg  2.5 mg Oral Daily [name], PA-C   2.5 mg at 11/24/15 1031  . ondansetron (ZOFRAN) injection 4 mg  4 mg  Intravenous Q6H PRN Lesle Chris Black, NP      . potassium chloride SA (K-DUR,KLOR-CON) CR tablet 40 mEq  40 mEq Oral BID Gwenyth Bender, NP   40 mEq at 11/24/15 1031  . sodium chloride 0.9 % injection 3 mL  3 mL Intravenous Q12H Gwenyth Bender, NP   3 mL at 11/23/15 2212  . sodium chloride 0.9 % injection 3 mL  3 mL Intravenous PRN Gwenyth Bender, NP      . verapamil (CALAN-SR) CR tablet 180 mg  180 mg Oral Daily Gwenyth Bender, NP   180 mg at 11/24/15 1259     Discharge Medications: Please see discharge summary for a list of discharge medications.  Relevant Imaging Results:  Relevant Lab Results:   Additional Information    Macario Golds, [place] [phone]

## 2015-11-24 NOTE — Progress Notes (Signed)
[facility] PROGRESS NOTE    Progress Note   Anita Wiley ZOX:[id number] DOB: [birth year] DOA: 11/22/2015 PCP: Angela Adam, MD   Brief Narrative:   Anita Wiley is an 74 y.o. female past medical history of chronic dystonic heart failure, CJD stage IV, with a pacemaker presents to the hospital with dyspnea was found to have be in acute diastolic heart failure.  Assessment/Plan:   Acute respiratory failure (HCC) due to Acute on chronic diastolic heart failure [facility]): The advanced heart failure team was consulted she was told her on IV Lasix and metolazone. She has had sluggish response, further management of her diuretics per cardiology. Reviewing her chart her weight has been as low as 88-86 kg.  S/P placement of cardiac pacemaker  Essential HTN (hypertension) Continue Coreg and verapamil, at goal.  Coronary artery disease, non-occlusive by cath Cont ASA.  CKD (chronic kidney disease) stage 4, GFR 15-29 ml/min (HCC) Continue to monitor kidney function. Back in August 2016 her creatinine was ranging around 1.3-1.5.  Gout: Continue on a paternal.  Iron deficiency anemia continue her sulfate.   DVT Prophylaxis - Lovenox ordered.  Family Communication: none Disposition Plan: Home unable to determined. Code Status:     Code Status Orders        Start     Ordered   11/22/15 1707  Do not attempt resuscitation (DNR)   Continuous    Question Answer Comment  In the event of cardiac or respiratory ARREST Do not call a "code blue"   In the event of cardiac or respiratory ARREST Do not perform Intubation, CPR, defibrillation or ACLS   In the event of cardiac or respiratory ARREST Use medication by any route, position, wound care, and other measures to relive pain and suffering. May use oxygen, suction and manual treatment of airway obstruction as needed for comfort.      11/22/15 1706        IV Access:    Peripheral IV   Procedures and diagnostic studies:    Dg Chest 2 View  11/22/2015  CLINICAL DATA:  Shortness of Breath EXAM: CHEST  2 VIEW COMPARISON:  September 20, 2015 FINDINGS: There is no edema or consolidation. Heart is enlarged, stable. Prominence of the main pulmonary outflow tract is also a stable finding. There appears to be normal tapering of the pulmonary arteries more distally. Pacemaker leads are attached to the right atrium and middle cardiac vein. No pneumothorax. No apparent adenopathy. There is arthropathy in both shoulders as well as degenerative change in the thoracic spine. IMPRESSION: Cardiomegaly. Prominence of the main pulmonary outflow tract potentially may indicate a degree of pulmonary arterial hypertension. No edema or consolidation. Stable pacemaker lead positions. 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 Electronically Signed   By: Bretta Bang III M.D.   On: 11/22/2015 13:49     Medical Consultants:    None.  Anti-Infectives:   Anti-infectives    None      Subjective:    Anita Wiley   Objective:    Filed Vitals:   11/23/15 1446 11/23/15 1650 11/23/15 2004 11/24/15 0515  BP:  121/50 127/90 123/64  Pulse:  61 66 73  Temp:  97.8 F (36.6 C) 98.3 F (36.8 C) 98 F (36.7 C)  TempSrc:  Oral Oral Oral  Resp:  Height:      Weight:    96.979 kg (213 lb 12.8 oz)  SpO2: 92% 100% 100% 100%    Intake/Output Summary (Last 24  hours) at 11/24/15 1136 Last data filed at 11/24/15 0600  Gross per 24 hour  Intake  802.5 ml  Output    650 ml  Net  152.5 ml   Filed Weights   11/23/15 0536 11/23/15 0900 11/24/15 0515  Weight: 105.008 kg (231 lb 8 oz) 97.9 kg (215 lb 13.3 oz) 96.979 kg (213 lb 12.8 oz)    Exam: Gen:  NAD Cardiovascular:  RRR, No M/R/G Chest and lungs:   CTAB Abdomen:  Abdomen soft, NT/ND, + BS Extremities:  No C/E/C   Data Reviewed:    Labs: Basic Metabolic Panel:  Recent Labs Lab 11/22/15 1308 11/23/15 0602 11/24/15 0344  NA 142 140 139  K 3.7 3.7 3.9  CL  104 103 102  CO2 29 29 29   GLUCOSE 95 82 93  BUN 38* 39* 41*  CREATININE 2.34* 2.41* 2.44*  CALCIUM 9.2 8.9 8.9  MG  --  1.9  --    GFR Estimated Creatinine Clearance: 22.2 mL/min (by C-G formula based on Cr of 2.44). Liver Function Tests: No results for input(s): AST, ALT, ALKPHOS, BILITOT, PROT, ALBUMIN in the last 168 hours. No results for input(s): LIPASE, AMYLASE in the last 168 hours. No results for input(s): AMMONIA in the last 168 hours. Coagulation profile No results for input(s): INR, PROTIME in the last 168 hours.  CBC:  Recent Labs Lab 11/22/15 1308  WBC 6.9  HGB 10.2*  HCT 32.5*  MCV 91.0  PLT 130*   Cardiac Enzymes:  Recent Labs Lab 11/22/15 1830 11/22/15 2357 11/23/15 0602  TROPONINI 0.05* 0.04* 0.05*   BNP (last 3 results) No results for input(s): PROBNP in the last 8760 hours. CBG:  Recent Labs Lab 11/22/15 2128 11/23/15 0659  GLUCAP 175* 79   D-Dimer: No results for input(s): DDIMER in the last 72 hours. Hgb A1c: No results for input(s): HGBA1C in the last 72 hours. Lipid Profile: No results for input(s): CHOL, HDL, LDLCALC, TRIG, CHOLHDL, LDLDIRECT in the last 72 hours. Thyroid function studies: No results for input(s): TSH, T4TOTAL, T3FREE, THYROIDAB in the last 72 hours.  Invalid input(s): FREET3 Anemia work up: No results for input(s): VITAMINB12, FOLATE, FERRITIN, TIBC, IRON, RETICCTPCT in the last 72 hours. Sepsis Labs:  Recent Labs Lab 11/22/15 1308  WBC 6.9   Microbiology No results found for this or any previous visit (from the past 240 hour(s)).   Medications:   . allopurinol  100 mg Oral Daily  . antiseptic oral rinse  7 mL Mouth Rinse BID  . aspirin EC  81 mg Oral Daily  . carvedilol  12.5 mg Oral BID WC  . enoxaparin (LOVENOX) injection  30 mg Subcutaneous Q24H  . ferrous sulfate  325 mg Oral Q breakfast  . metolazone  2.5 mg Oral Daily  . potassium chloride SA  40 mEq Oral BID  . sodium chloride  3 mL  Intravenous Q12H  . verapamil  180 mg Oral Daily   Continuous Infusions: . furosemide (LASIX) infusion 10 mg/hr (11/23/15 0945)    Time spent: 25 min   LOS: 2 days   Marinda ElkFELIZ ORTIZ, ABRAHAM  [facility] Pager [phone]  *Please refer to [email or web address], password [email or web address] to get updated schedule on who will round on this patient, as hospitalists switch teams weekly. If 7PM-7AM, please contact night-coverage at [email or web address], password [email or web address] for any overnight needs.  11/24/2015, 11:36 AM

## 2015-11-25 DIAGNOSIS — Z950 Presence of cardiac pacemaker: Secondary | ICD-10-CM | POA: Insufficient documentation

## 2015-11-25 LAB — BASIC METABOLIC PANEL
Anion gap: 8 (ref 5–15)
BUN: 42 mg/dL — AB (ref 6–20)
CALCIUM: 8.9 mg/dL (ref 8.9–10.3)
CO2: 30 mmol/L (ref 22–32)
CREATININE: 2.39 mg/dL — AB (ref 0.44–1.00)
Chloride: 100 mmol/L — ABNORMAL LOW (ref 101–111)
GFR calc non Af Amer: 19 mL/min — ABNORMAL LOW (ref >60)
GFR, EST AFRICAN AMERICAN: 22 mL/min — AB (ref >60)
Glucose, Bld: 101 mg/dL — ABNORMAL HIGH (ref 65–99)
Potassium: 4.2 mmol/L (ref 3.5–5.1)
Sodium: 138 mmol/L (ref 135–145)

## 2015-11-25 LAB — GLUCOSE, CAPILLARY: Glucose-Capillary: 143 mg/dL — ABNORMAL HIGH (ref 65–99)

## 2015-11-25 MED ORDER — METOLAZONE 2.5 MG PO TABS
2.5000 mg | ORAL_TABLET | Freq: Two times a day (BID) | ORAL | Status: DC
  Administered 2015-11-25 – 2015-12-03 (×16): 2.5 mg via ORAL
  Filled 2015-11-25 (×16): qty 1

## 2015-11-25 NOTE — Progress Notes (Signed)
SUBJECTIVE: Says her breathing has significantly improved from yesterday. IV infiltrated and may have not received Lasix infusion for some time. Regardless, bag just emptied for 700 cc with an additional 300 cc in bag. No chest pain.     Intake/Output Summary (Last 24 hours) at 11/25/15 1126 Last data filed at 11/25/15 1100  Gross per 24 hour  Intake    920 ml  Output   3300 ml  Net  -2380 ml    Current Facility-Administered Medications  Medication Dose Route Frequency Provider Last Rate Last Dose  . 0.9 %  sodium chloride infusion  250 mL Intravenous PRN Gwenyth Bender, NP      . acetaminophen (TYLENOL) tablet 650 mg  650 mg Oral Q4H PRN Gwenyth Bender, NP      . allopurinol (ZYLOPRIM) tablet 100 mg  100 mg Oral Daily Gwenyth Bender, NP   100 mg at 11/25/15 0931  . antiseptic oral rinse (CPC / CETYLPYRIDINIUM CHLORIDE 0.05%) solution 7 mL  7 mL Mouth Rinse BID Belkys A Regalado, MD   7 mL at 11/25/15 0932  . aspirin EC tablet 81 mg  81 mg Oral Daily Gwenyth Bender, NP   81 mg at 11/25/15 0931  . carvedilol (COREG) tablet 12.5 mg  12.5 mg Oral BID WC Calvert Cantor, MD   12.5 mg at 11/25/15 0931  . enoxaparin (LOVENOX) injection 30 mg  30 mg Subcutaneous Q24H Gwenyth Bender, NP   30 mg at 11/24/15 2036  . ferrous sulfate tablet 325 mg  325 mg Oral Q breakfast Calvert Cantor, MD   325 mg at 11/25/15 0931  . furosemide (LASIX) 250 mg in dextrose 5 % 250 mL (1 mg/mL) infusion  10 mg/hr Intravenous Continuous [name], PA-C 10 mL/hr at 11/23/15 0945 10 mg/hr at 11/23/15 0945  . metolazone (ZAROXOLYN) tablet 2.5 mg  2.5 mg Oral BID Marinda Elk, MD      . ondansetron [facility]) injection 4 mg  4 mg Intravenous Q6H PRN Lesle Chris Black, NP      . potassium chloride SA (K-DUR,KLOR-CON) CR tablet 40 mEq  40 mEq Oral BID Gwenyth Bender, NP   40 mEq at 11/25/15 0931  . sodium chloride 0.9 % injection 3 mL  3 mL Intravenous Q12H Gwenyth Bender, NP   3 mL at 11/24/15 2104  . sodium  chloride 0.9 % injection 3 mL  3 mL Intravenous PRN Gwenyth Bender, NP      . verapamil (CALAN-SR) CR tablet 180 mg  180 mg Oral Daily Gwenyth Bender, NP   180 mg at 11/25/15 0929    Filed Vitals:   11/24/15 1230 11/24/15 2008 11/25/15 0358 11/25/15 0929  BP: 114/58 129/48 97/72 114/45  Pulse: 68 62 62   Temp: 97.8 F (36.6 C) 98.4 F (36.9 C) 98.3 F (36.8 C)   TempSrc: Oral Oral Oral   Resp: Height:      Weight:   225 lb 8 oz (102.286 kg)   SpO2: 100% 90% 100%     PHYSICAL EXAM General: Elderly woman sitting in chair. No acute distress HEENT: normal x for poor dentition, oxygen via [place]. Neck: supple. JVP to ear. No thyromegaly or nodule noted.  Cor: PMI nondisplaced. RRR. 2/6 TR, widely split S2.  Lungs: Bibasilar crackles, diminished throughout Abdomen: Obese soft, NT, moderately distended. Extremities: no cyanosis, clubbing, rash, 2+pitting pretibial edema  Neuro: alert & orientedx3, cranial nerves grossly intact. moves all 4 extremities w/o difficulty. Affect pleasant  TELEMETRY: Reviewed telemetry pt in AV pacing, 60 bpm range.   LABS: Basic Metabolic Panel:  Recent Labs  16/09/9611/16/16 0602 11/24/15 0344 11/25/15 0250  NA 140 139 138  K 3.7 3.9 4.2  CL 103 102 100*  CO2 29 29 30   GLUCOSE 82 93 101*  BUN 39* 41* 42*  CREATININE 2.41* 2.44* 2.39*  CALCIUM 8.9 8.9 8.9  MG 1.9  --   --    Liver Function Tests: No results for input(s): AST, ALT, ALKPHOS, BILITOT, PROT, ALBUMIN in the last 72 hours. No results for input(s): LIPASE, AMYLASE in the last 72 hours. CBC:  Recent Labs  11/22/15 1308  WBC 6.9  HGB 10.2*  HCT 32.5*  MCV 91.0  PLT 130*   Cardiac Enzymes:  Recent Labs  11/22/15 1830 11/22/15 2357 11/23/15 0602  TROPONINI 0.05* 0.04* 0.05*   BNP: Invalid input(s): POCBNP D-Dimer: No results for input(s): DDIMER in the last 72 hours. Hemoglobin A1C: No results for input(s): HGBA1C in the last 72 hours. Fasting Lipid Panel: No  results for input(s): CHOL, HDL, LDLCALC, TRIG, CHOLHDL, LDLDIRECT in the last 72 hours. Thyroid Function Tests: No results for input(s): TSH, T4TOTAL, T3FREE, THYROIDAB in the last 72 hours.  Invalid input(s): FREET3 Anemia Panel: No results for input(s): VITAMINB12, FOLATE, FERRITIN, TIBC, IRON, RETICCTPCT in the last 72 hours.  RADIOLOGY: Dg Chest 2 View  11/22/2015  CLINICAL DATA:  Shortness of Breath EXAM: CHEST  2 VIEW COMPARISON:  September 20, 2015 FINDINGS: There is no edema or consolidation. Heart is enlarged, stable. Prominence of the main pulmonary outflow tract is also a stable finding. There appears to be normal tapering of the pulmonary arteries more distally. Pacemaker leads are attached to the right atrium and middle cardiac vein. No pneumothorax. No apparent adenopathy. There is arthropathy in both shoulders as well as degenerative change in the thoracic spine. IMPRESSION: Cardiomegaly. Prominence of the main pulmonary outflow tract potentially may indicate a degree of pulmonary arterial hypertension. No edema or consolidation. Stable pacemaker lead positions. 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 Electronically Signed   By: Bretta BangWilliam  Woodruff III M.D.   On: 11/22/2015 13:49      ASSESSMENT AND PLAN: 1. 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 She developed volume overload in the setting of getting the wrong diuretic and dose at a rehab facility recently. She remains markedly volume overloaded with elevated JVP to ear and pretibial edema, but is gradually improving.  - IV infiltrated and may have not received Lasix infusion for some time. Regardless, bag just emptied for 700 cc with an additional 300 cc in bag.  - Continue Lasix gtt 10 mg/hr and metolazone 2.5 mg daily.   2. Acute on chronic respiratory failure - sats improving on O2 via [place].  3. CKD stage IV: Monitor closely  with diuresis. BUN 42, creat 2.39 today.  4. 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 A-V sequential pacing with reported paroxysms of a fib  5. 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 Continue to diurese.  6. Essential HTN: BP stable on current meds.    Prentice DockerSuresh Muriel Wilber, M.D., F.A.C.C.

## 2015-11-25 NOTE — Progress Notes (Addendum)
[facility] PROGRESS NOTE    Progress Note   Kynsley Fayette ZOX:[id number] DOB: 10/01/1941 DOA: 11/22/2015 PCP: Angela AdamAHMED,SYED A, MD   Brief Narrative:   Anita Wiley Ohlrich is an 74 y.o. femaMady Haagensenle past medical history of chronic dystonic heart failure, CJD stage IV, with a pacemaker presents to the hospital with dyspnea was found to have be in acute diastolic heart failure.  Assessment/Plan:   Acute respiratory failure (HCC) due to Acute on chronic diastolic heart failure [facility](HCC): The advanced heart failure team was consulted, on IV Lasix  Gtt and metolazone. She remains significantly fluid overloaded. Improve response to diuretic therapy, cardiology on board. Reviewing her chart her weight has been as low as 88-86 kg. Needs to have daily standing weights.  S/P placement of cardiac pacemaker  Essential HTN (hypertension) Continue Coreg and verapamil, at goal.  Coronary artery disease, non-occlusive by cath Cont ASA.  CKD (chronic kidney disease) stage 4, GFR 15-29 ml/min (HCC) Continue to monitor kidney function. On admission renal function was 2.4 Back in August 2016 her creatinine was ranging around 1.3-1.5.  Gout: Continue on a allopurinol.  Iron deficiency anemia: continue her sulfate.    DVT Prophylaxis - Lovenox ordered.  Family Communication: none Disposition Plan: Home unable to determined. Code Status:     Code Status Orders        Start     Ordered   11/22/15 1707  Do not attempt resuscitation (DNR)   Continuous    Question Answer Comment  In the event of cardiac or respiratory ARREST Do not call a "code blue"   In the event of cardiac or respiratory ARREST Do not perform Intubation, CPR, defibrillation or ACLS   In the event of cardiac or respiratory ARREST Use medication by any route, position, wound care, and other measures to relive pain and suffering. May use oxygen, suction and manual treatment of airway obstruction as needed for comfort.      11/22/15  1706        IV Access:    Peripheral IV   Procedures and diagnostic studies:   No results found.   Medical Consultants:    None.  Anti-Infectives:   Anti-infectives    None      Subjective:    Anita Wiley Pfiester she relates her abdomen feels better.  Objective:    Filed Vitals:   11/24/15 1230 11/24/15 2008 11/25/15 0358 11/25/15 0929  BP: 114/58 129/48 97/72 114/45  Pulse: 68 62 62   Temp: 97.8 F (36.6 C) 98.4 F (36.9 C) 98.3 F (36.8 C)   TempSrc: Oral Oral Oral   Resp: 18 17 18    Height:      Weight:   102.286 kg (225 lb 8 oz)   SpO2: 100% 90% 100%     Intake/Output Summary (Last 24 hours) at 11/25/15 0946 Last data filed at 11/25/15 0852  Gross per 24 hour  Intake    920 ml  Output   2600 ml  Net  -1680 ml   Filed Weights   11/23/15 0900 11/24/15 0515 11/25/15 0358  Weight: 97.9 kg (215 lb 13.3 oz) 96.979 kg (213 lb 12.8 oz) 102.286 kg (225 lb 8 oz)    Exam: Gen:  NAD Cardiovascular:  RRR, +JVD. Chest and lungs:   CTAB Abdomen:  Abdomen soft, NT/ND, + BS Extremities:  Lower ext edema.   Data Reviewed:    Labs: Basic Metabolic Panel:  Recent Labs Lab 11/22/15 1308 11/23/15 0602 11/24/15 0344 11/25/15 0250  NA 142 140 139 138  K 3.7 3.7 3.9 4.2  CL 104 103 102 100*  CO2 GLUCOSE 95 82 93 101*  BUN 38* 39* 41* 42*  CREATININE 2.34* 2.41* 2.44* 2.39*  CALCIUM 9.2 8.9 8.9 8.9  MG  --  1.9  --   --    GFR Estimated Creatinine Clearance: 23.3 mL/min (by C-G formula based on Cr of 2.39). Liver Function Tests: No results for input(s): AST, ALT, ALKPHOS, BILITOT, PROT, ALBUMIN in the last 168 hours. No results for input(s): LIPASE, AMYLASE in the last 168 hours. No results for input(s): AMMONIA in the last 168 hours. Coagulation profile No results for input(s): INR, PROTIME in the last 168 hours.  CBC:  Recent Labs Lab 11/22/15 1308  WBC 6.9  HGB 10.2*  HCT 32.5*  MCV 91.0  PLT 130*   Cardiac  Enzymes:  Recent Labs Lab 11/22/15 1830 11/22/15 2357 11/23/15 0602  TROPONINI 0.05* 0.04* 0.05*   BNP (last 3 results) No results for input(s): PROBNP in the last 8760 hours. CBG:  Recent Labs Lab 11/22/15 2128 11/23/15 0659  GLUCAP 175* 79   D-Dimer: No results for input(s): DDIMER in the last 72 hours. Hgb A1c: No results for input(s): HGBA1C in the last 72 hours. Lipid Profile: No results for input(s): CHOL, HDL, LDLCALC, TRIG, CHOLHDL, LDLDIRECT in the last 72 hours. Thyroid function studies: No results for input(s): TSH, T4TOTAL, T3FREE, THYROIDAB in the last 72 hours.  Invalid input(s): FREET3 Anemia work up: No results for input(s): VITAMINB12, FOLATE, FERRITIN, TIBC, IRON, RETICCTPCT in the last 72 hours. Sepsis Labs:  Recent Labs Lab 11/22/15 1308  WBC 6.9   Microbiology No results found for this or any previous visit (from the past 240 hour(s)).   Medications:   . allopurinol  100 mg Oral Daily  . antiseptic oral rinse  7 mL Mouth Rinse BID  . aspirin EC  81 mg Oral Daily  . carvedilol  12.5 mg Oral BID WC  . enoxaparin (LOVENOX) injection  30 mg Subcutaneous Q24H  . ferrous sulfate  325 mg Oral Q breakfast  . metolazone  2.5 mg Oral Daily  . potassium chloride SA  40 mEq Oral BID  . sodium chloride  3 mL Intravenous Q12H  . verapamil  180 mg Oral Daily   Continuous Infusions: . furosemide (LASIX) infusion 10 mg/hr (11/23/15 0945)    Time spent: 25 min   LOS: 3 days   Marinda Elk  [facility] Pager [phone]  *Please refer to [email or web address], password [email or web address] to get updated schedule on who will round on this patient, as hospitalists switch teams weekly. If 7PM-7AM, please contact night-coverage at [email or web address], password [email or web address] for any overnight needs.  11/25/2015, 9:46 AM

## 2015-11-26 DIAGNOSIS — I480 Paroxysmal atrial fibrillation: Secondary | ICD-10-CM

## 2015-11-26 DIAGNOSIS — N184 Chronic kidney disease, stage 4 (severe): Secondary | ICD-10-CM

## 2015-11-26 LAB — BASIC METABOLIC PANEL
ANION GAP: 8 (ref 5–15)
BUN: 45 mg/dL — ABNORMAL HIGH (ref 6–20)
CALCIUM: 8.8 mg/dL — AB (ref 8.9–10.3)
CO2: 33 mmol/L — AB (ref 22–32)
Chloride: 97 mmol/L — ABNORMAL LOW (ref 101–111)
Creatinine, Ser: 2.27 mg/dL — ABNORMAL HIGH (ref 0.44–1.00)
GFR calc Af Amer: 23 mL/min — ABNORMAL LOW (ref >60)
GFR calc non Af Amer: 20 mL/min — ABNORMAL LOW (ref >60)
GLUCOSE: 94 mg/dL (ref 65–99)
Potassium: 4.2 mmol/L (ref 3.5–5.1)
Sodium: 138 mmol/L (ref 135–145)

## 2015-11-26 LAB — GLUCOSE, CAPILLARY
GLUCOSE-CAPILLARY: 99 mg/dL (ref 65–99)
Glucose-Capillary: 103 mg/dL — ABNORMAL HIGH (ref 65–99)
Glucose-Capillary: 101 mg/dL — ABNORMAL HIGH (ref 65–99)

## 2015-11-26 MED ORDER — RIVAROXABAN 15 MG PO TABS
15.0000 mg | ORAL_TABLET | Freq: Every day | ORAL | Status: DC
  Administered 2015-11-26 – 2015-12-05 (×10): 15 mg via ORAL
  Filled 2015-11-26 (×10): qty 1

## 2015-11-26 NOTE — Progress Notes (Signed)
[facility] PROGRESS NOTE    Progress Note   Anita Wiley ZOX:[id number] DOB: 07/12/1941 DOA: 11/22/2015 PCP: Angela AdamAHMED,SYED A, MD   Brief Narrative:   Anita HaagensenVenna Wiley is an 74 y.o. female past medical history of chronic dystonic heart failure, CJD stage IV, with a pacemaker presents to the hospital with dyspnea was found to have be in acute diastolic heart failure.  Assessment/Plan:   Acute respiratory failure (HCC) due to Acute on chronic diastolic heart failure [facility](HCC): The advanced heart failure team was consulted, on IV Lasix Gtt and metolazone. She remains significantly fluid overloaded. She has not responded as expected to increase in diuretic therapy, cardiology on board. Reviewing her chart her weight has been as low as 88-86 kg.  S/P placement of cardiac pacemaker  Essential HTN (hypertension) Continue Coreg and verapamil, at goal.  Coronary artery disease, non-occlusive by cath Cont ASA.  CKD (chronic kidney disease) stage 4, GFR 15-29 ml/min (HCC) Continue to monitor kidney function. On admission renal function was 2.4 Back in August 2016 her creatinine was ranging around 1.3-1.5.  Gout: Continue on a allopurinol.  Iron deficiency anemia: continue her sulfate.    DVT Prophylaxis - Lovenox ordered.  Family Communication: none Disposition Plan: Home unable to determined. Code Status:     Code Status Orders        Start     Ordered   11/22/15 1707  Do not attempt resuscitation (DNR)   Continuous    Question Answer Comment  In the event of cardiac or respiratory ARREST Do not call a "code blue"   In the event of cardiac or respiratory ARREST Do not perform Intubation, CPR, defibrillation or ACLS   In the event of cardiac or respiratory ARREST Use medication by any route, position, wound care, and other measures to relive pain and suffering. May use oxygen, suction and manual treatment of airway obstruction as needed for comfort.      11/22/15 1706         IV Access:    Peripheral IV   Procedures and diagnostic studies:   No results found.   Medical Consultants:    None.  Anti-Infectives:   Anti-infectives    None      Subjective:    Anita Wiley she relates no new complaints would like to go home for Christmas.  Objective:    Filed Vitals:   11/25/15 2016 11/26/15 0509 11/26/15 0810 11/26/15 0827  BP: 120/61 113/53  123/53  Pulse: 60 62  60  Temp: 98.2 F (36.8 C) 98.2 F (36.8 C)    TempSrc: Oral Oral    Resp: 18 18  18   Height:      Weight:  100.925 kg (222 lb 8 oz) 97 kg (213 lb 13.5 oz)   SpO2: 96% 99%  100%    Intake/Output Summary (Last 24 hours) at 11/26/15 1041 Last data filed at 11/26/15 0946  Gross per 24 hour  Intake 3145.17 ml  Output   2800 ml  Net 345.17 ml   Filed Weights   11/25/15 0358 11/26/15 0509 11/26/15 0810  Weight: 102.286 kg (225 lb 8 oz) 100.925 kg (222 lb 8 oz) 97 kg (213 lb 13.5 oz)    Exam: Gen:  NAD Cardiovascular:  RRR, +JVD. Chest and lungs:   CTAB Abdomen:  Abdomen soft, NT/ND, + BS Extremities:  Lower ext edema.   Data Reviewed:    Labs: Basic Metabolic Panel:  Recent Labs Lab 11/22/15 1308 11/23/15 0602 11/24/15 0344  11/25/15 0250 11/26/15 0332  NA 142 140 139 138 138  K 3.7 3.7 3.9 4.2 4.2  CL 104 103 102 100* 97*  CO2 33*  GLUCOSE 95 82 93 101* 94  BUN 38* 39* 41* 42* 45*  CREATININE 2.34* 2.41* 2.44* 2.39* 2.27*  CALCIUM 9.2 8.9 8.9 8.9 8.8*  MG  --  1.9  --   --   --    GFR Estimated Creatinine Clearance: 23.8 mL/min (by C-G formula based on Cr of 2.27). Liver Function Tests: No results for input(s): AST, ALT, ALKPHOS, BILITOT, PROT, ALBUMIN in the last 168 hours. No results for input(s): LIPASE, AMYLASE in the last 168 hours. No results for input(s): AMMONIA in the last 168 hours. Coagulation profile No results for input(s): INR, PROTIME in the last 168 hours.  CBC:  Recent Labs Lab 11/22/15 1308  WBC 6.9   HGB 10.2*  HCT 32.5*  MCV 91.0  PLT 130*   Cardiac Enzymes:  Recent Labs Lab 11/22/15 1830 11/22/15 2357 11/23/15 0602  TROPONINI 0.05* 0.04* 0.05*   BNP (last 3 results) No results for input(s): PROBNP in the last 8760 hours. CBG:  Recent Labs Lab 11/22/15 2128 11/23/15 0659 11/25/15 2115  GLUCAP 175* 79 143*   D-Dimer: No results for input(s): DDIMER in the last 72 hours. Hgb A1c: No results for input(s): HGBA1C in the last 72 hours. Lipid Profile: No results for input(s): CHOL, HDL, LDLCALC, TRIG, CHOLHDL, LDLDIRECT in the last 72 hours. Thyroid function studies: No results for input(s): TSH, T4TOTAL, T3FREE, THYROIDAB in the last 72 hours.  Invalid input(s): FREET3 Anemia work up: No results for input(s): VITAMINB12, FOLATE, FERRITIN, TIBC, IRON, RETICCTPCT in the last 72 hours. Sepsis Labs:  Recent Labs Lab 11/22/15 1308  WBC 6.9   Microbiology No results found for this or any previous visit (from the past 240 hour(s)).   Medications:   . allopurinol  100 mg Oral Daily  . antiseptic oral rinse  7 mL Mouth Rinse BID  . carvedilol  12.5 mg Oral BID WC  . ferrous sulfate  325 mg Oral Q breakfast  . metolazone  2.5 mg Oral BID  . potassium chloride SA  40 mEq Oral BID  . rivaroxaban  15 mg Oral Q1200  . sodium chloride  3 mL Intravenous Q12H  . verapamil  180 mg Oral Daily   Continuous Infusions: . furosemide (LASIX) infusion 15 mg/hr (11/26/15 0831)    Time spent: 25 min   LOS: 4 days   Marinda Elk  [facility] Pager [phone]  *Please refer to [email or web address], password [email or web address] to get updated schedule on who will round on this patient, as hospitalists switch teams weekly. If 7PM-7AM, please contact night-coverage at [email or web address], password [email or web address] for any overnight needs.  11/26/2015, 10:41 AM

## 2015-11-26 NOTE — Care Management Important Message (Signed)
Important Message  Patient Details  Name: Mady HaagensenVenna Grisanti [name]: [id number] Date of Birth: 01/03/1941   Medicare Important Message Given:  Yes    Agam Tuohy P Ellena Kamen 11/26/2015, 3:13 PM

## 2015-11-26 NOTE — Discharge Instructions (Signed)

## 2015-11-26 NOTE — Progress Notes (Signed)
Physical Therapy Treatment Patient Details Name: Anita Wiley [name]: [id number] DOB: 12/21/1940 Today's Date: 11/26/2015    History of Present Illness 74 y.o. female past medical history that includes CHF, hypertension, CAD, presents to the emergency department with chief complaint of gradual worsening shortness of breath and lower extremity edema. Initial evaluation in the emergency department reveals acute respiratory failure related to acute on chronic heart failure    PT Comments    Pt is getting up to walk with PT and noted some safety issues persisting.  Her plan continues to be to go home when done with therapy and will have family assistance to ease the transition after SNF.  Follow Up Recommendations  SNF     Equipment Recommendations  None recommended by PT    Recommendations for Other Services       Precautions / Restrictions Precautions Precautions: Fall Restrictions Weight Bearing Restrictions: No    Mobility  Bed Mobility Overal bed mobility: Needs Assistance Bed Mobility: Supine to Sit     Supine to sit: Min guard;Min assist     General bed mobility comments: used bed rail and gave support to scoot out onto edge of bed  Transfers Overall transfer level: Needs assistance Equipment used: Rolling walker (2 wheeled) Transfers: Sit to/from [employer] Sit to Stand: Min assist Stand pivot transfers: Min guard;Min assist       General transfer comment: mild assist to power up and to control descent with cued hand place ment  Ambulation/Gait Ambulation/Gait assistance: Min assist;Min guard Ambulation Distance (Feet): 150 Feet Assistive device: Rolling walker (2 wheeled) Gait Pattern/deviations: Decreased stride length;Wide base of support;Drifts right/left (awkward turns with walker, hitting edge of wheel on obstacle) Gait velocity: decreased Gait velocity interpretation: Below normal speed for age/gender General Gait Details: on 3L O2 with  no increased SOB with effort   Stairs            Wheelchair Mobility    Modified Rankin (Stroke Patients Only)       Balance Overall balance assessment: Needs assistance Sitting-balance support: Feet supported Sitting balance-Leahy Scale: Good     Standing balance support: Bilateral upper extremity supported Standing balance-Leahy Scale: Fair Standing balance comment: fair- dynamic balance support                    Cognition Arousal/Alertness: Awake/alert Behavior During Therapy: WFL for tasks assessed/performed Overall Cognitive Status: Within Functional Limits for tasks assessed                      Exercises      General Comments General comments (skin integrity, edema, etc.): Pt is demonstrating some control of walker but still unsafe, has family with her at home to assist.when more steady      Pertinent Vitals/Pain Pain Assessment: No/denies pain    Home Living                      Prior Function            PT Goals (current goals can now be found in the care plan section) Acute Rehab PT Goals Patient Stated Goal: walk on hallway Progress towards PT goals: Progressing toward goals    Frequency  Min 2X/week    PT Plan Current plan remains appropriate    Co-evaluation             End of Session Equipment Utilized During Treatment: Gait belt;Oxygen Activity Tolerance: Patient  limited by fatigue Patient left: in chair;with call bell/phone within reach     Time: 0930-0958 PT Time Calculation (min) (ACUTE ONLY): 28 min  Charges:  $Gait Training: 8-22 mins                    G Codes:      Ivar DrapeStout, Anita Wiley 11/26/2015, 1:10 PM   Anita Wiley, PT MS Acute Rehab Dept. Number: [facility] [phone] and [facility] [phone]

## 2015-11-26 NOTE — Progress Notes (Signed)
Advanced Heart Failure Rounding Note  Primary HF: Dr. Shirlee Latch    Subjective:    Patient comfortable at rest.  Very difficult to follow weights as it appears that she is getting weighed in the bed on the night shift.  Not sure I/Os accurate either despite foley catheter in place.    Objective:   Weight Range: 222 lb 8 oz (100.925 kg) Body mass index is 38.17 kg/(m^2).   Vital Signs:   Temp:  [98.2 F (36.8 C)-98.4 F (36.9 C)] 98.2 F (36.8 C) (12/19 0509) Pulse Rate:  [60-64] 62 (12/19 0509) Resp:  [18] 18 (12/19 0509) BP: (113-121)/(45-64) 113/53 mmHg (12/19 0509) SpO2:  [96 %-99 %] 99 % (12/19 0509) Weight:  [222 lb 8 oz (100.925 kg)] 222 lb 8 oz (100.925 kg) (12/19 0509) Last BM Date: 11/22/15  Weight change: Filed Weights   11/24/15 0515 11/25/15 0358 11/26/15 0509  Weight: 213 lb 12.8 oz (96.979 kg) 225 lb 8 oz (102.286 kg) 222 lb 8 oz (100.925 kg)    Intake/Output:   Intake/Output Summary (Last 24 hours) at 11/26/15 0757 Last data filed at 11/26/15 0600  Gross per 24 hour  Intake   2640 ml  Output   2400 ml  Net    240 ml     Physical Exam: General: Chronically ill and elderly appearing. NAD HEENT: normal except for poor dentition, 02 3 lpm via [place]. Neck: supple. JVP to ear. Carotids 2+ bilat; no bruits. No thyromegaly noted.  Cor: PMI nondisplaced. RRR. 2/6 TR, widely split S2.  Lungs: Bibasilar crackles, diminished breath sounds Abdomen: Obese soft, NT, + distention, no HSM. No bruits or masses. +BS  Extremities: no cyanosis, clubbing, rash, 2+ edema to knees bilaterally.  Neuro: alert & orientedx3, cranial nerves grossly intact. moves all 4 extremities w/o difficulty. Affect pleasant  Telemetry: Reviewed personally, AV paced in 60s  Labs: CBC No results for input(s): WBC, NEUTROABS, HGB, HCT, MCV, PLT in the last 72 hours. Basic Metabolic Panel  Recent Labs  11/25/15 0250 11/26/15 0332  NA 138 138  K 4.2 4.2  CL 100* 97*  CO2 30 33*  GLUCOSE  101* 94  BUN 42* 45*  CREATININE 2.39* 2.27*  CALCIUM 8.9 8.8*   Liver Function Tests No results for input(s): AST, ALT, ALKPHOS, BILITOT, PROT, ALBUMIN in the last 72 hours. No results for input(s): LIPASE, AMYLASE in the last 72 hours. Cardiac Enzymes No results for input(s): CKTOTAL, CKMB, CKMBINDEX, TROPONINI in the last 72 hours.  BNP: BNP (last 3 results)  Recent Labs  09/11/15 1726 10/03/15 1259 11/22/15 1308  BNP 1228.8* 662.7* 1370.0*    ProBNP (last 3 results) No results for input(s): PROBNP in the last 8760 hours.   D-Dimer No results for input(s): DDIMER in the last 72 hours. Hemoglobin A1C No results for input(s): HGBA1C in the last 72 hours. Fasting Lipid Panel No results for input(s): CHOL, HDL, LDLCALC, TRIG, CHOLHDL, LDLDIRECT in the last 72 hours. Thyroid Function Tests No results for input(s): TSH, T4TOTAL, T3FREE, THYROIDAB in the last 72 hours.  Invalid input(s): FREET3  Other results:     Imaging/Studies:  No results found.  Latest Echo  Latest Cath   Medications:     Scheduled Medications: . allopurinol  100 mg Oral Daily  . antiseptic oral rinse  7 mL Mouth Rinse BID  . aspirin EC  81 mg Oral Daily  . carvedilol  12.5 mg Oral BID WC  . enoxaparin (LOVENOX) injection  30 mg Subcutaneous Q24H  . ferrous sulfate  325 mg Oral Q breakfast  . metolazone  2.5 mg Oral BID  . potassium chloride SA  40 mEq Oral BID  . sodium chloride  3 mL Intravenous Q12H  . verapamil  180 mg Oral Daily    Infusions: . furosemide (LASIX) infusion 10 mg/hr (11/25/15 1444)    PRN Medications: sodium chloride, acetaminophen, ondansetron (ZOFRAN) IV, sodium chloride   Assessment/Plan   Anita Wiley is a 74 y.o. female with history of diastolic CHF, HTN, gout, and bradycardia s/p Medtronic dual chamber pacemaker in 2010 [facility] SOB and pedal edema. HF team consulted with A/C diastolic CHF.   1. 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   She developed volume overload in the setting of getting the wrong diuretic and dose at a rehab facility recently. She remains markedly volume overloaded.  Creatinine stable to lower.  - Increase lasix gtt to 15 mg/hr and continue metolazone 2.5 mg BID.  2. Acute on chronic respiratory failure - Improved on 02 via [place]. 3. CKD stage IV: Creatinine trending down. - Monitor closely with diuresis 4. 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  5. 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  - Continue to diurese. 6. HTN: BP stable on current meds. 7. PAF: Noted on interrogation that she has been in and out of afib. - Will start on xarelto. Pharmacy consult for dosing.   Length of Stay: 4  [name] 11/26/2015, 7:57 AM  Advanced Heart Failure Team Pager [phone] (M-F; 7a - 4p)  Please contact [facility] for night-coverage after hours (4p -7a ) and weekends on [email or web address]  Patient seen with PA, agree with the above note.  Hard to follow diuresis with inaccurate weights but she is still volume overloaded.  - Increase Lasix gtt to 15 mg/hr and continue bid metolazone.  Creatinine so far stable.   PPM interrogation showed paroxysmal atrial fibrillation.  She denies recent falls or overt GI bleeding.  I will have her stop ASA and start renally-dosed Xarelto to lower CVA risk.  CHADSVASC at least 4.   Will mobilize, out of bed.   Marca AnconaDalton McLean 11/26/2015 8:27 AM

## 2015-11-26 NOTE — Progress Notes (Signed)
CARDIAC REHAB PHASE I   PRE:  Rate/Rhythm: 64 pacing    BP: sitting 114/41    SaO2: 100 2L  MODE:  Ambulation: 90 ft   POST:  Rate/Rhythm: 81 pacing    BP: sitting 103/49     SaO2: 100 2L  Pt needed mod assist with gait belt to stand from recliner. Fairly steady walking with RW but DOE. Rest x2 due SOB/fatigue in short distance. Return to recliner with feet elevated. VSS. Will continue to follow. [id number]   Anita Wiley, Anita Wiley Anita Wiley CES, ACSM 11/26/2015 2:19 PM

## 2015-11-26 NOTE — Progress Notes (Signed)
CSW met with patient today due to new SNF order.  Patient states that she was just released from [facility] in [place], [place] on 11/21/15 and returned home where she was staying with her sister at night.  She was brought to [facility] on 11/22/15 by her sister "because she said I was full of fluid."  Patient indicates that she has depleted her 20 full covered days of Medicare in the SNF and is aware that she would have co-pay days if she went back to SNF. Patient adamantly states she will not return to [facility] and also refuses CSW's offer to assist with locating any other SNF.  "I am going to my home and I will stay with my sister or brother at night as I planned."  She states she has multiple siblings who are all very supportive.  At d/c from the SNF- she indicates that they set up for [facility] to initiate services and ordered her a rolling walker.  She is currently using a walker from the facility that will have to be returned once she is given a permanent one.  Patient states she has a shower chair but not other DME at home.  Above information relayed to [facility] for follow when medically stable. Patient noted to be alert and oriented; she is emphatic that this will be her d/c plan.  CSW will sign off but will be available as needed.  Lorie Phenix. Pauline Good, [place]

## 2015-11-26 NOTE — Progress Notes (Signed)
ANTICOAGULATION CONSULT NOTE - Initial Consult  Pharmacy Consult for Xarelto Indication: atrial fibrillation  Allergies  Allergen Reactions  . Codeine Itching    Patient Measurements: Height: 5\' 4"  (162.6 cm) Weight: 213 lb 13.5 oz (97 kg) IBW/kg (Calculated) : 54.7  Vital Signs: Temp: 98.2 F (36.8 C) (12/19 0509) Temp Source: Oral (12/19 0509) BP: 123/53 mmHg (12/19 0827) Pulse Rate: 60 (12/19 0827)  Labs:  Recent Labs  11/24/15 0344 11/25/15 0250 11/26/15 0332  CREATININE 2.44* 2.39* 2.27*    Estimated Creatinine Clearance: 23.8 mL/min (by C-G formula based on Cr of 2.27).   Medical History: Past Medical History  Diagnosis Date  . Hypertension   . Bradycardia     s/p pacemaker in 2010  . Chronic kidney disease   . Gout   . Pacemaker 2010  . Arthritis     OSTEO  . Gout   . CHF (congestive heart failure) [facility](HCC)     Assessment: 74 year old female to begin Xarelto for Afib CrCl = 24 ml / min  Goal of Therapy:  Monitor platelets by anticoagulation protocol: Yes   Plan:  Xarelto 15 mg po daily Pharmacy to sign off  Thank you [name], PharmD [phone]  11/26/2015,9:49 AM

## 2015-11-27 ENCOUNTER — Telehealth: Payer: Self-pay | Admitting: *Deleted

## 2015-11-27 LAB — BASIC METABOLIC PANEL
ANION GAP: 10 (ref 5–15)
BUN: 47 mg/dL — ABNORMAL HIGH (ref 6–20)
CALCIUM: 9.4 mg/dL (ref 8.9–10.3)
CO2: 34 mmol/L — ABNORMAL HIGH (ref 22–32)
CREATININE: 2.21 mg/dL — AB (ref 0.44–1.00)
Chloride: 95 mmol/L — ABNORMAL LOW (ref 101–111)
GFR, EST AFRICAN AMERICAN: 24 mL/min — AB (ref >60)
GFR, EST NON AFRICAN AMERICAN: 20 mL/min — AB (ref >60)
Glucose, Bld: 91 mg/dL (ref 65–99)
Potassium: 4 mmol/L (ref 3.5–5.1)
SODIUM: 139 mmol/L (ref 135–145)

## 2015-11-27 LAB — CBC
HCT: 29.5 % — ABNORMAL LOW (ref 36.0–46.0)
HEMOGLOBIN: 9.2 g/dL — AB (ref 12.0–15.0)
MCH: 28.5 pg (ref 26.0–34.0)
MCHC: 31.2 g/dL (ref 30.0–36.0)
MCV: 91.3 fL (ref 78.0–100.0)
PLATELETS: 128 10*3/uL — AB (ref 150–400)
RBC: 3.23 MIL/uL — AB (ref 3.87–5.11)
RDW: 16.8 % — ABNORMAL HIGH (ref 11.5–15.5)
WBC: 5.9 10*3/uL (ref 4.0–10.5)

## 2015-11-27 LAB — GLUCOSE, CAPILLARY
GLUCOSE-CAPILLARY: 91 mg/dL (ref 65–99)
GLUCOSE-CAPILLARY: 94 mg/dL (ref 65–99)
GLUCOSE-CAPILLARY: 106 mg/dL — AB (ref 65–99)
Glucose-Capillary: 154 mg/dL — ABNORMAL HIGH (ref 65–99)

## 2015-11-27 NOTE — Progress Notes (Signed)
CARDIAC REHAB PHASE I   PRE:  Rate/Rhythm: 60 paced  BP:  Supine:   Sitting: 110/46  Standing:    SaO2: 100% 2L   95%RA  MODE:  Ambulation: 100 ft   POST:  Rate/Rhythm: 68 paced  BP:  Supine:   Sitting: 110/46  Standing:    SaO2: 93%RA   Put back to 2L 1234-1310 Had to get assistance to help stand pt even with rocking. Pt walked 100 ft on RA with gait belt use, rolling walker and asst x 1 with steady gait once up. Encouraged pursed lip breathing. Pt rested a couple of times. Some SOB at end of walk but sats at 93%. Put back on 2L. Recliner with call bell.   Luetta Nuttingharlene Goku [place], RN BSN  11/27/2015 1:07 PM

## 2015-11-27 NOTE — Research (Signed)
I followed up with patient for 2868-month REDS@ Discharge Study. Patient was hospitalized for heart failure. I thanked patient for participating in the Reds @ discharge Study.

## 2015-11-27 NOTE — Progress Notes (Signed)
Advanced Heart Failure Rounding Note  Primary HF: Dr. Shirlee LatchMcLean    Subjective:    Pt weight initially this morning 221, on recheck standing pt weight down to 208.  She diuresed well with Lasix gtt + metolazone.   Objective:   Weight Range: 208 lb 12.8 oz (94.711 kg) Body mass index is 35.82 kg/(m^2).   Vital Signs:   Temp:  [97.4 F (36.3 C)-98.5 F (36.9 C)] 98.5 F (36.9 C) (12/20 0428) Pulse Rate:  [60-61] 60 (12/20 0428) Resp:  [18] 18 (12/20 0428) BP: (114-129)/(52-57) 129/57 mmHg (12/20 0428) SpO2:  [99 %-100 %] 100 % (12/20 0428) Weight:  [208 lb 12.8 oz (94.711 kg)-221 lb 4.8 oz (100.381 kg)] 208 lb 12.8 oz (94.711 kg) (12/20 0720) Last BM Date: 11/26/15  Weight change: Filed Weights   11/26/15 0810 11/27/15 0428 11/27/15 0720  Weight: 213 lb 13.5 oz (97 kg) 221 lb 4.8 oz (100.381 kg) 208 lb 12.8 oz (94.711 kg)    Intake/Output:   Intake/Output Summary (Last 24 hours) at 11/27/15 0733 Last data filed at 11/27/15 0720  Gross per 24 hour  Intake 1367.42 ml  Output   3752 ml  Net -2384.58 ml     Physical Exam: General: Chronically ill and elderly appearing. NAD HEENT: normal except for poor dentition, 02 3 lpm via [place]. Neck: supple. JVP 10-11cm. Carotids 2+ bilat; no bruits. No thyromegaly or nodule noted.  Cor: PMI nondisplaced. RRR. 2/6 TR, widely split S2.  Lungs: Bibasilar crackles, diminished breath sounds Abdomen: Obese soft, non-tender, + distention, no HSM. No bruits or masses. Bowel sounds presents x 4.  Extremities: no cyanosis, clubbing, rash, 1+ edema to knees bilaterally.  Neuro: A&Ox3, cranial nerves grossly intact. moves all 4 extremities w/o difficulty. Affect pleasant  Telemetry: AV paced in 60s  Labs: CBC  Recent Labs  11/27/15 0535  WBC 5.9  HGB 9.2*  HCT 29.5*  MCV 91.3  PLT 128*   Basic Metabolic Panel  Recent Labs  11/26/15 0332 11/27/15 0535  NA 138 139  K 4.2 4.0  CL 97* 95*  CO2 33* 34*  GLUCOSE 94 91  BUN 45* 47*   CREATININE 2.27* 2.21*  CALCIUM 8.8* 9.4   Liver Function Tests No results for input(s): AST, ALT, ALKPHOS, BILITOT, PROT, ALBUMIN in the last 72 hours. No results for input(s): LIPASE, AMYLASE in the last 72 hours. Cardiac Enzymes No results for input(s): CKTOTAL, CKMB, CKMBINDEX, TROPONINI in the last 72 hours.  BNP: BNP (last 3 results)  Recent Labs  09/11/15 1726 10/03/15 1259 11/22/15 1308  BNP 1228.8* 662.7* 1370.0*    ProBNP (last 3 results) No results for input(s): PROBNP in the last 8760 hours.   D-Dimer No results for input(s): DDIMER in the last 72 hours. Hemoglobin A1C No results for input(s): HGBA1C in the last 72 hours. Fasting Lipid Panel No results for input(s): CHOL, HDL, LDLCALC, TRIG, CHOLHDL, LDLDIRECT in the last 72 hours. Thyroid Function Tests No results for input(s): TSH, T4TOTAL, T3FREE, THYROIDAB in the last 72 hours.  Invalid input(s): FREET3  Other results:     Imaging/Studies:  No results found.  Latest Echo  Latest Cath   Medications:     Scheduled Medications: . allopurinol  100 mg Oral Daily  . antiseptic oral rinse  7 mL Mouth Rinse BID  . carvedilol  12.5 mg Oral BID WC  . ferrous sulfate  325 mg Oral Q breakfast  . metolazone  2.5 mg Oral BID  .  potassium chloride SA  40 mEq Oral BID  . rivaroxaban  15 mg Oral Q1200  . sodium chloride  3 mL Intravenous Q12H  . verapamil  180 mg Oral Daily    Infusions: . furosemide (LASIX) infusion 15 mg/hr (11/26/15 1753)    PRN Medications: sodium chloride, acetaminophen, ondansetron (ZOFRAN) IV, sodium chloride   Assessment/Plan   Anita Wiley is a 74 y.o. female with history of diastolic CHF, HTN, gout, and bradycardia s/p Medtronic dual chamber pacemaker in 2010 who presented to [facility] with worsening SOB and pedal edema. HF team consulted with A/C diastolic CHF.   1. 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   She developed volume overload in the setting of getting the wrong diuretic and dose at a rehab facility recently. She remains volume overloaded but improving, weight down 5 lbs.  Creatinine stable.   - Continue lasix gtt 15 mg/hr and metolazone 2.5 mg BID.  - Recent baselines weights as low as 180.  Likely has 15-20 lbs of fluid still on board. 2. Acute on chronic respiratory failure: Improved on 02 via [place]. 3. CKD stage IV: Creatinine stable. - Monitor closely with diuresis 4. 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  5. 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  - Continue to diurese. 6. HTN: BP stable on current meds. 7. PAF: Noted on device interrogation that she has been in and out of afib.  This patient's CHA2DS2-VASc is at least 4 - Continue xarelto 15 mg.    Length of Stay: 5  Anita Freer PA-C 11/27/2015, 7:33 AM  Advanced Heart Failure Team Pager [phone] (M-F; 7a - 4p)  Please contact [facility] for night-coverage after hours (4p -7a ) and weekends on [email or web address]  Patient seen with PA, agree with the above note.  Weights difficult to interpret as she persistently gets weighed in the bed by night shift.  However, standing weight today shows that she's down about 5 lbs.  She diuresed well yesterday but is still volume overloaded.  Continue current regimen.   She is now on Xarelto for paroxysmal atrial fibrillation seen on device interrogation.   Short of breath with ambulation + balance issues.  PT recommends SNF but patient is fairly insistent on going home with help from family. She had a bad experience in a SNF in [place] recently.   Anita Wiley 11/27/2015 7:46 AM

## 2015-11-27 NOTE — Telephone Encounter (Signed)
I was doing 3 -month follow-up for [email or web address] . Patient was hospitalized for congestive heart failure. I spoke to the patient in her room. i thanked patient for participating in the REDS@ Discharge Study.

## 2015-11-27 NOTE — Progress Notes (Signed)
[facility] PROGRESS NOTE    Progress Note   Anita Wiley ZOX:[id number] DOB: 09-23-1941 DOA: 11/22/2015 PCP: Angela Adam, MD   Brief Narrative:   Anita Wiley is an 74 y.o. female past medical history of chronic dystonic heart failure, CJD stage IV, with a pacemaker presents to the hospital with dyspnea was found to have be in acute diastolic heart failure.  Assessment/Plan:   Acute respiratory failure (HCC) due to Acute on chronic diastolic heart failure [facility]): The advanced heart failure team was consulted, on IV Lasix Gtt and metolazone. She remains significantly fluid overloaded. Cardiology on board. Reviewing her chart her weight has been as low as 88-86 kg.  S/P placement of cardiac pacemaker  Essential HTN (hypertension) Continue Coreg and verapamil, at goal.  Coronary artery disease, non-occlusive by cath Cont ASA.  CKD (chronic kidney disease) stage 4, GFR 15-29 ml/min (HCC) Continue to monitor kidney function. On admission renal function was 2.4 Back in August 2016 her creatinine was ranging around 1.3-1.5.  Gout: Continue on a allopurinol.  Iron deficiency anemia: continue her sulfate.    DVT Prophylaxis - Lovenox ordered.  Family Communication: none Disposition Plan: Home unable to determined. Code Status:     Code Status Orders        Start     Ordered   11/22/15 1707  Do not attempt resuscitation (DNR)   Continuous    Question Answer Comment  In the event of cardiac or respiratory ARREST Do not call a "code blue"   In the event of cardiac or respiratory ARREST Do not perform Intubation, CPR, defibrillation or ACLS   In the event of cardiac or respiratory ARREST Use medication by any route, position, wound care, and other measures to relive pain and suffering. May use oxygen, suction and manual treatment of airway obstruction as needed for comfort.      11/22/15 1706        IV Access:    Peripheral IV   Procedures and diagnostic  studies:   No results found.   Medical Consultants:    None.  Anti-Infectives:   Anti-infectives    None      Subjective:    Anita Wiley she relates no new complains.  Objective:    Filed Vitals:   11/26/15 1321 11/26/15 2100 11/27/15 0428 11/27/15 0720  BP: 114/57 120/52 129/57   Pulse: 61 60 60   Temp: 97.4 F (36.3 C) 98 F (36.7 C) 98.5 F (36.9 C)   TempSrc: Oral Oral Oral   Resp: Height:      Weight:   100.381 kg (221 lb 4.8 oz) 94.711 kg (208 lb 12.8 oz)  SpO2: 99% 100% 100%     Intake/Output Summary (Last 24 hours) at 11/27/15 0903 Last data filed at 11/27/15 0720  Gross per 24 hour  Intake 622.25 ml  Output   3752 ml  Net -3129.75 ml   Filed Weights   11/26/15 0810 11/27/15 0428 11/27/15 0720  Weight: 97 kg (213 lb 13.5 oz) 100.381 kg (221 lb 4.8 oz) 94.711 kg (208 lb 12.8 oz)    Exam: Gen:  NAD Cardiovascular:  RRR. Chest and lungs:   CTAB Abdomen:  Abdomen soft, NT/ND, + BS Extremities:  Lower ext edema.   Data Reviewed:    Labs: Basic Metabolic Panel:  Recent Labs Lab 11/23/15 0602 11/24/15 0344 11/25/15 0250 11/26/15 0332 11/27/15 0535  NA 140 139 138 138 139  K 3.7 3.9 4.2  4.2 4.0  CL 103 102 100* 97* 95*  CO2 29 29 30  33* 34*  GLUCOSE 82 93 101* 94 91  BUN 39* 41* 42* 45* 47*  CREATININE 2.41* 2.44* 2.39* 2.27* 2.21*  CALCIUM 8.9 8.9 8.9 8.8* 9.4  MG 1.9  --   --   --   --    GFR Estimated Creatinine Clearance: 24.2 mL/min (by C-G formula based on Cr of 2.21). Liver Function Tests: No results for input(s): AST, ALT, ALKPHOS, BILITOT, PROT, ALBUMIN in the last 168 hours. No results for input(s): LIPASE, AMYLASE in the last 168 hours. No results for input(s): AMMONIA in the last 168 hours. Coagulation profile No results for input(s): INR, PROTIME in the last 168 hours.  CBC:  Recent Labs Lab 11/22/15 1308 11/27/15 0535  WBC 6.9 5.9  HGB 10.2* 9.2*  HCT 32.5* 29.5*  MCV 91.0 91.3  PLT 130*  128*   Cardiac Enzymes:  Recent Labs Lab 11/22/15 1830 11/22/15 2357 11/23/15 0602  TROPONINI 0.05* 0.04* 0.05*   BNP (last 3 results) No results for input(s): PROBNP in the last 8760 hours. CBG:  Recent Labs Lab 11/25/15 2115 11/26/15 1100 11/26/15 1647 11/26/15 2128 11/27/15 0618  GLUCAP 143* 103* 101* 99 91   D-Dimer: No results for input(s): DDIMER in the last 72 hours. Hgb A1c: No results for input(s): HGBA1C in the last 72 hours. Lipid Profile: No results for input(s): CHOL, HDL, LDLCALC, TRIG, CHOLHDL, LDLDIRECT in the last 72 hours. Thyroid function studies: No results for input(s): TSH, T4TOTAL, T3FREE, THYROIDAB in the last 72 hours.  Invalid input(s): FREET3 Anemia work up: No results for input(s): VITAMINB12, FOLATE, FERRITIN, TIBC, IRON, RETICCTPCT in the last 72 hours. Sepsis Labs:  Recent Labs Lab 11/22/15 1308 11/27/15 0535  WBC 6.9 5.9   Microbiology No results found for this or any previous visit (from the past 240 hour(s)).   Medications:   . allopurinol  100 mg Oral Daily  . antiseptic oral rinse  7 mL Mouth Rinse BID  . carvedilol  12.5 mg Oral BID WC  . ferrous sulfate  325 mg Oral Q breakfast  . metolazone  2.5 mg Oral BID  . potassium chloride SA  40 mEq Oral BID  . rivaroxaban  15 mg Oral Q1200  . sodium chloride  3 mL Intravenous Q12H  . verapamil  180 mg Oral Daily   Continuous Infusions: . furosemide (LASIX) infusion 15 mg/hr (11/26/15 1753)    Time spent: 15 min   LOS: 5 days   Marinda ElkFELIZ ORTIZ, Shankar Silber  [facility] Pager [phone]  *Please refer to [email or web address], password [email or web address] to get updated schedule on who will round on this patient, as hospitalists switch teams weekly. If 7PM-7AM, please contact night-coverage at [email or web address], password [email or web address] for any overnight needs.  11/27/2015, 9:03 AM

## 2015-11-28 ENCOUNTER — Inpatient Hospital Stay (HOSPITAL_COMMUNITY): Payer: Medicare Other

## 2015-11-28 DIAGNOSIS — R109 Unspecified abdominal pain: Secondary | ICD-10-CM | POA: Insufficient documentation

## 2015-11-28 DIAGNOSIS — I509 Heart failure, unspecified: Secondary | ICD-10-CM

## 2015-11-28 DIAGNOSIS — R1013 Epigastric pain: Secondary | ICD-10-CM

## 2015-11-28 LAB — BASIC METABOLIC PANEL
ANION GAP: 9 (ref 5–15)
BUN: 52 mg/dL — ABNORMAL HIGH (ref 6–20)
CALCIUM: 9 mg/dL (ref 8.9–10.3)
CO2: 36 mmol/L — ABNORMAL HIGH (ref 22–32)
Chloride: 96 mmol/L — ABNORMAL LOW (ref 101–111)
Creatinine, Ser: 2.17 mg/dL — ABNORMAL HIGH (ref 0.44–1.00)
GFR, EST AFRICAN AMERICAN: 24 mL/min — AB (ref >60)
GFR, EST NON AFRICAN AMERICAN: 21 mL/min — AB (ref >60)
GLUCOSE: 98 mg/dL (ref 65–99)
POTASSIUM: 3.9 mmol/L (ref 3.5–5.1)
SODIUM: 141 mmol/L (ref 135–145)

## 2015-11-28 LAB — GLUCOSE, CAPILLARY
GLUCOSE-CAPILLARY: 85 mg/dL (ref 65–99)
GLUCOSE-CAPILLARY: 84 mg/dL (ref 65–99)
GLUCOSE-CAPILLARY: 117 mg/dL — AB (ref 65–99)

## 2015-11-28 MED ORDER — ACETAZOLAMIDE ER 500 MG PO CP12
500.0000 mg | ORAL_CAPSULE | Freq: Two times a day (BID) | ORAL | Status: DC
  Administered 2015-11-28 – 2015-12-03 (×11): 500 mg via ORAL
  Filled 2015-11-28 (×12): qty 1

## 2015-11-28 NOTE — Progress Notes (Signed)
[facility] PROGRESS NOTE    Progress Note   Anita HaagensenVenna Schwager ZOX:[id number] DOB: 07/08/1941 DOA: 11/22/2015 PCP: Angela AdamAHMED,SYED A, MD   Brief Narrative:   Anita Wiley is an 74 y.o. female past medical history of chronic dystonic heart failure, CJD stage IV, with a pacemaker presents to the hospital with dyspnea was found to have be in acute diastolic heart failure.  Assessment/Plan:   Acute respiratory failure (HCC) due to Acute on chronic diastolic heart failure [facility](HCC): The advanced heart failure team was consulted, on IV Lasix Gtt and metolazone. She remains significantly fluid overloaded. Cardiology on board.weight cont to improve. Reviewing her chart her weight has been as low as 88-86 kg.  S/P placement of cardiac pacemaker  Essential HTN (hypertension) Continue Coreg and verapamil, at goal.  Coronary artery disease, non-occlusive by cath Cont ASA.  CKD (chronic kidney disease) stage 4, GFR 15-29 ml/min (HCC) Continue to monitor kidney function. On admission renal function was 2.4 Back in August 2016 her creatinine was ranging around 1.3-1.5.  Gout: Continue on a allopurinol.  Iron deficiency anemia: continue her sulfate.    DVT Prophylaxis - Lovenox ordered.  Family Communication: none Disposition Plan: Home 3-4 days Code Status:     Code Status Orders        Start     Ordered   11/22/15 1707  Do not attempt resuscitation (DNR)   Continuous    Question Answer Comment  In the event of cardiac or respiratory ARREST Do not call a "code blue"   In the event of cardiac or respiratory ARREST Do not perform Intubation, CPR, defibrillation or ACLS   In the event of cardiac or respiratory ARREST Use medication by any route, position, wound care, and other measures to relive pain and suffering. May use oxygen, suction and manual treatment of airway obstruction as needed for comfort.      11/22/15 1706        IV Access:    Peripheral IV   Procedures and  diagnostic studies:   No results found.   Medical Consultants:    None.  Anti-Infectives:   Anti-infectives    None      Subjective:    Anita HaagensenVenna Beyersdorf she relates no new complains.  Objective:    Filed Vitals:   11/27/15 0720 11/27/15 1126 11/27/15 2054 11/28/15 0713  BP:  121/50 110/52 112/49  Pulse:  61 60 60  Temp:  98 F (36.7 C) 98.6 F (37 C) 98.8 F (37.1 C)  TempSrc:  Oral Oral Oral  Resp:  18 18 20   Height:      Weight: 94.711 kg (208 lb 12.8 oz)   94.892 kg (209 lb 3.2 oz)  SpO2:  100% 98% 94%    Intake/Output Summary (Last 24 hours) at 11/28/15 0827 Last data filed at 11/28/15 0800  Gross per 24 hour  Intake   1650 ml  Output   2000 ml  Net   -350 ml   Filed Weights   11/27/15 0428 11/27/15 0720 11/28/15 0713  Weight: 100.381 kg (221 lb 4.8 oz) 94.711 kg (208 lb 12.8 oz) 94.892 kg (209 lb 3.2 oz)    Exam: Gen:  NAD Cardiovascular:  RRR. Chest and lungs:   CTAB Abdomen:  Abdomen soft, NT/ND, + BS Extremities:  2+ lower ext edema.   Data Reviewed:    Labs: Basic Metabolic Panel:  Recent Labs Lab 11/23/15 0602 11/24/15 0344 11/25/15 0250 11/26/15 0332 11/27/15 0535  NA 140 139 138  138 139  K 3.7 3.9 4.2 4.2 4.0  CL 103 102 100* 97* 95*  CO2 33* 34*  GLUCOSE 82 93 101* 94 91  BUN 39* 41* 42* 45* 47*  CREATININE 2.41* 2.44* 2.39* 2.27* 2.21*  CALCIUM 8.9 8.9 8.9 8.8* 9.4  MG 1.9  --   --   --   --    GFR Estimated Creatinine Clearance: 24.2 mL/min (by C-G formula based on Cr of 2.21). Liver Function Tests: No results for input(s): AST, ALT, ALKPHOS, BILITOT, PROT, ALBUMIN in the last 168 hours. No results for input(s): LIPASE, AMYLASE in the last 168 hours. No results for input(s): AMMONIA in the last 168 hours. Coagulation profile No results for input(s): INR, PROTIME in the last 168 hours.  CBC:  Recent Labs Lab 11/22/15 1308 11/27/15 0535  WBC 6.9 5.9  HGB 10.2* 9.2*  HCT 32.5* 29.5*  MCV 91.0 91.3    PLT 130* 128*   Cardiac Enzymes:  Recent Labs Lab 11/22/15 1830 11/22/15 2357 11/23/15 0602  TROPONINI 0.05* 0.04* 0.05*   BNP (last 3 results) No results for input(s): PROBNP in the last 8760 hours. CBG:  Recent Labs Lab 11/27/15 0618 11/27/15 1124 11/27/15 1619 11/27/15 2111 11/28/15 0658  GLUCAP 91 94 154* 106* 85   D-Dimer: No results for input(s): DDIMER in the last 72 hours. Hgb A1c: No results for input(s): HGBA1C in the last 72 hours. Lipid Profile: No results for input(s): CHOL, HDL, LDLCALC, TRIG, CHOLHDL, LDLDIRECT in the last 72 hours. Thyroid function studies: No results for input(s): TSH, T4TOTAL, T3FREE, THYROIDAB in the last 72 hours.  Invalid input(s): FREET3 Anemia work up: No results for input(s): VITAMINB12, FOLATE, FERRITIN, TIBC, IRON, RETICCTPCT in the last 72 hours. Sepsis Labs:  Recent Labs Lab 11/22/15 1308 11/27/15 0535  WBC 6.9 5.9   Microbiology No results found for this or any previous visit (from the past 240 hour(s)).   Medications:   . allopurinol  100 mg Oral Daily  . antiseptic oral rinse  7 mL Mouth Rinse BID  . carvedilol  12.5 mg Oral BID WC  . ferrous sulfate  325 mg Oral Q breakfast  . metolazone  2.5 mg Oral BID  . potassium chloride SA  40 mEq Oral BID  . rivaroxaban  15 mg Oral Q1200  . sodium chloride  3 mL Intravenous Q12H  . verapamil  180 mg Oral Daily   Continuous Infusions: . furosemide (LASIX) infusion 15 mg/hr (11/27/15 1424)    Time spent: 15 min   LOS: 6 days   Marinda Elk  [facility] Pager [phone]  *Please refer to [email or web address], password [email or web address] to get updated schedule on who will round on this patient, as hospitalists switch teams weekly. If 7PM-7AM, please contact night-coverage at [email or web address], password [email or web address] for any overnight needs.  11/28/2015, 8:27 AM

## 2015-11-28 NOTE — Progress Notes (Signed)
Advanced Heart Failure Rounding Note  Primary HF: Dr. Shirlee LatchMcLean    Subjective:    Weigh unchanged despite lasix gtt (15) + metolazone.  + ab pain. SOB with exertion. Does not want to go to facility.   Objective:   Weight Range: 209 lb 3.2 oz (94.892 kg) Body mass index is 35.89 kg/(m^2).   Vital Signs:   Temp:  [98 F (36.7 C)-98.8 F (37.1 C)] 98.8 F (37.1 C) (12/21 0713) Pulse Rate:  [60-61] 60 (12/21 0713) Resp:  [18-20] 20 (12/21 0713) BP: (110-121)/(49-52) 112/49 mmHg (12/21 0713) SpO2:  [94 %-100 %] 94 % (12/21 0713) Weight:  [209 lb 3.2 oz (94.892 kg)] 209 lb 3.2 oz (94.892 kg) (12/21 0713) Last BM Date: 11/26/15  Weight change: Filed Weights   11/27/15 0428 11/27/15 0720 11/28/15 0713  Weight: 221 lb 4.8 oz (100.381 kg) 208 lb 12.8 oz (94.711 kg) 209 lb 3.2 oz (94.892 kg)    Intake/Output:   Intake/Output Summary (Last 24 hours) at 11/28/15 [id number] Last data filed at 11/28/15 0800  Gross per 24 hour  Intake   1890 ml  Output   2000 ml  Net   -110 ml     Physical Exam: General: Chronically ill and elderly appearing. NAD. In [place] HEENT: normal except for poor dentition, 02 3 lpm via [place]. Neck: supple. JVP  To jaw  but hard to assess. Carotids 2+ bilat; no bruits. No thyromegaly or nodule noted.  Cor: PMI nondisplaced. RRR. 2/6 TR, widely split S2.  Lungs: Bibasilar crackles, diminished breath sounds Abdomen: Obese soft, non-tender, + distention, no HSM. No bruits or masses. Bowel sounds presents x 4.  Extremities: no cyanosis, clubbing, rash, 1+ edema to knees bilaterally.  Neuro: A&Ox3, cranial nerves grossly intact. moves all 4 extremities w/o difficulty. Affect pleasant  Telemetry: AV paced in 60s  Labs: CBC  Recent Labs  11/27/15 0535  WBC 5.9  HGB 9.2*  HCT 29.5*  MCV 91.3  PLT 128*   Basic Metabolic Panel  Recent Labs  11/26/15 0332 11/27/15 0535  NA 138 139  K 4.2 4.0  CL 97* 95*  CO2 33* 34*  GLUCOSE 94 91  BUN 45* 47*   CREATININE 2.27* 2.21*  CALCIUM 8.8* 9.4   Liver Function Tests No results for input(s): AST, ALT, ALKPHOS, BILITOT, PROT, ALBUMIN in the last 72 hours. No results for input(s): LIPASE, AMYLASE in the last 72 hours. Cardiac Enzymes No results for input(s): CKTOTAL, CKMB, CKMBINDEX, TROPONINI in the last 72 hours.  BNP: BNP (last 3 results)  Recent Labs  09/11/15 1726 10/03/15 1259 11/22/15 1308  BNP 1228.8* 662.7* 1370.0*    ProBNP (last 3 results) No results for input(s): PROBNP in the last 8760 hours.   D-Dimer No results for input(s): DDIMER in the last 72 hours. Hemoglobin A1C No results for input(s): HGBA1C in the last 72 hours. Fasting Lipid Panel No results for input(s): CHOL, HDL, LDLCALC, TRIG, CHOLHDL, LDLDIRECT in the last 72 hours. Thyroid Function Tests No results for input(s): TSH, T4TOTAL, T3FREE, THYROIDAB in the last 72 hours.  Invalid input(s): FREET3  Other results:     Imaging/Studies:  No results found.  Latest Echo  Latest Cath   Medications:     Scheduled Medications: . allopurinol  100 mg Oral Daily  . antiseptic oral rinse  7 mL Mouth Rinse BID  . carvedilol  12.5 mg Oral BID WC  . ferrous sulfate  325 mg Oral Q breakfast  . metolazone  2.5 mg Oral BID  . potassium chloride SA  40 mEq Oral BID  . rivaroxaban  15 mg Oral Q1200  . sodium chloride  3 mL Intravenous Q12H  . verapamil  180 mg Oral Daily    Infusions: . furosemide (LASIX) infusion 15 mg/hr (11/27/15 1424)    PRN Medications: sodium chloride, acetaminophen, ondansetron (ZOFRAN) IV, sodium chloride   Assessment/Plan   Anita Wiley is a 74 y.o. female with history of diastolic CHF, HTN, gout, and bradycardia s/p Medtronic dual chamber pacemaker in 2010 who presented to [facility] with worsening SOB and pedal edema. HF team consulted with A/C diastolic CHF.   1. 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   She developed volume overload in the setting of getting the wrong diuretic and dose at a rehab facility recently. She remains volume overloaded but improving, weight down 5 lbs.  Creatinine stable.   - Continue lasix gtt 15 mg/hr and metolazone 2.5 mg BID. CO2 trending up. Add 500 mg diamox twice a day.  CO2 36 BUN 2.17  2. Acute on chronic respiratory failure: Improved on 02 via [place]. 3. CKD stage IV: Creatinine stable. - Monitor closely with diuresis 4. 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  5. 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  - Continue to diurese. 6. HTN: BP stable on current meds. 7. PAF: Noted on device interrogation that she has been in and out of afib.  This patient's CHA2DS2-VASc is at least 4. - Continue xarelto 15 mg daily .    Length of Stay: 6  Amy Clegg NP-C  11/28/2015, 9:18 AM  Advanced Heart Failure Team Pager [phone] (M-F; 7a - 4p)  Please contact [facility] for night-coverage after hours (4p -7a ) and weekends on [email or web address]  Patient seen and examined with Tonye Becket, NP. We discussed all aspects of the encounter. I agree with the assessment and plan as stated above.   Volume status still looks up but diuresis has slowed. Agree with adding acetazolamide and assessing response. i am not sure that her dry weight is truly 185. Can consider RHC as needed.   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 Will proceed with noncontrast CT of ab and pelvis. D/w Dr. Robb Matar.   Yazen Rosko,MD 3:40 PM

## 2015-11-28 NOTE — Progress Notes (Signed)
CARDIAC REHAB PHASE I   PRE:  Rate/Rhythm: 60 Pacing  BP:  Supine:   Sitting: 118/51  Standing:    SaO2: 100 2L  MODE:  Ambulation: 120 ft   POST:  Rate/Rhythm: 63  BP:  Supine:   Sitting: 122/47  Standing:    SaO2: 96 RA [id number] Assisted X 1 used gait belt and walker to ambulate. Had to help  pt to stand using gait belt but was able to get her up with one assist. Once up pt steady and was able to walk 120 feet. VS stable. Pt back to recliner after walk with call light in reach. She states that she is feeling a little stronger today.  Melina CopaLisa Rahkim Rabalais RN 11/28/2015 1:19 PM

## 2015-11-29 ENCOUNTER — Encounter (HOSPITAL_COMMUNITY): Payer: Self-pay | Admitting: Radiology

## 2015-11-29 DIAGNOSIS — K8689 Other specified diseases of pancreas: Secondary | ICD-10-CM | POA: Insufficient documentation

## 2015-11-29 DIAGNOSIS — K869 Disease of pancreas, unspecified: Secondary | ICD-10-CM

## 2015-11-29 LAB — GLUCOSE, CAPILLARY
GLUCOSE-CAPILLARY: 102 mg/dL — AB (ref 65–99)
GLUCOSE-CAPILLARY: 98 mg/dL (ref 65–99)
Glucose-Capillary: 90 mg/dL (ref 65–99)
Glucose-Capillary: 115 mg/dL — ABNORMAL HIGH (ref 65–99)

## 2015-11-29 LAB — BASIC METABOLIC PANEL
Anion gap: 12 (ref 5–15)
BUN: 51 mg/dL — AB (ref 6–20)
CALCIUM: 9.1 mg/dL (ref 8.9–10.3)
CO2: 35 mmol/L — ABNORMAL HIGH (ref 22–32)
Chloride: 88 mmol/L — ABNORMAL LOW (ref 101–111)
Creatinine, Ser: 2.22 mg/dL — ABNORMAL HIGH (ref 0.44–1.00)
GFR calc Af Amer: 24 mL/min — ABNORMAL LOW (ref >60)
GFR, EST NON AFRICAN AMERICAN: 20 mL/min — AB (ref >60)
Glucose, Bld: 110 mg/dL — ABNORMAL HIGH (ref 65–99)
POTASSIUM: 3.5 mmol/L (ref 3.5–5.1)
SODIUM: 135 mmol/L (ref 135–145)

## 2015-11-29 NOTE — Progress Notes (Signed)
Advanced Heart Failure Rounding Note  Primary HF: Dr. Shirlee LatchMcLean    Subjective:    Acetazolamide added yesterday. She says she is peeing a lot but weight still up despite that and lasix gtt (15) + metolazone.  Remains SOB. Creatinine unchanged. Walked 120 feet with PT  CT of abdomen shows large cystic pancreatic mass.   Objective:   Weight Range: 95.709 kg (211 lb) Body mass index is 36.2 kg/(m^2).   Vital Signs:   Temp:  [97.8 F (36.6 C)-98.1 F (36.7 C)] 97.9 F (36.6 C) (12/22 0503) Pulse Rate:  [59-63] 63 (12/22 0503) Resp:  [18-22] 18 (12/22 0503) BP: (111-140)/(48-68) 140/68 mmHg (12/22 0503) SpO2:  [96 %-98 %] 96 % (12/22 0503) Weight:  [95.709 kg (211 lb)] 95.709 kg (211 lb) (12/22 0503) Last BM Date: 11/27/15  Weight change: Filed Weights   11/27/15 0720 11/28/15 0713 11/29/15 0503  Weight: 94.711 kg (208 lb 12.8 oz) 94.892 kg (209 lb 3.2 oz) 95.709 kg (211 lb)    Intake/Output:   Intake/Output Summary (Last 24 hours) at 11/29/15 0915 Last data filed at 11/29/15 0855  Gross per 24 hour  Intake 1945.5 ml  Output   3900 ml  Net -1954.5 ml     Physical Exam: General: Chronically ill and elderly appearing. NAD. In chiar HEENT: normal except for poor dentition, 02 3 lpm via [place]. Neck: supple. JVP  To jaw . Carotids 2+ bilat; no bruits. No thyromegaly or nodule noted.  Cor: PMI nondisplaced. RRR. 2/6 TR, widely split S2.  Lungs: Bibasilar crackles, diminished breath sounds Abdomen: Obese soft, non-tender, + distention, no HSM. No bruits or masses. Bowel sounds presents x 4.  Extremities: no cyanosis, clubbing, rash, 2+ edema to knees bilaterally.  Neuro: A&Ox3, cranial nerves grossly intact. moves all 4 extremities w/o difficulty. Affect pleasant  Telemetry: AV paced in 60s  Labs: CBC  Recent Labs  11/27/15 0535  WBC 5.9  HGB 9.2*  HCT 29.5*  MCV 91.3  PLT 128*   Basic Metabolic Panel  Recent Labs  11/28/15 1034 11/29/15 0420  NA 141 135  K  3.9 3.5  CL 96* 88*  CO2 36* 35*  GLUCOSE 98 110*  BUN 52* 51*  CREATININE 2.17* 2.22*  CALCIUM 9.0 9.1   Liver Function Tests No results for input(s): AST, ALT, ALKPHOS, BILITOT, PROT, ALBUMIN in the last 72 hours. No results for input(s): LIPASE, AMYLASE in the last 72 hours. Cardiac Enzymes No results for input(s): CKTOTAL, CKMB, CKMBINDEX, TROPONINI in the last 72 hours.  BNP: BNP (last 3 results)  Recent Labs  09/11/15 1726 10/03/15 1259 11/22/15 1308  BNP 1228.8* 662.7* 1370.0*    ProBNP (last 3 results) No results for input(s): PROBNP in the last 8760 hours.   D-Dimer No results for input(s): DDIMER in the last 72 hours. Hemoglobin A1C No results for input(s): HGBA1C in the last 72 hours. Fasting Lipid Panel No results for input(s): CHOL, HDL, LDLCALC, TRIG, CHOLHDL, LDLDIRECT in the last 72 hours. Thyroid Function Tests No results for input(s): TSH, T4TOTAL, T3FREE, THYROIDAB in the last 72 hours.  Invalid input(s): FREET3  Other results:     Imaging/Studies:  Ct Abdomen Pelvis Wo Contrast  11/29/2015  CLINICAL DATA:  Abdominal pain and history of pancreatic mass EXAM: CT ABDOMEN AND PELVIS WITHOUT CONTRAST TECHNIQUE: Multidetector CT imaging of the abdomen and pelvis was performed following the standard protocol without IV contrast. COMPARISON:  None. FINDINGS: Lower chest and abdominal wall: Anasarca. Cardiomegaly. Trace  bilateral pleural effusion. Partly visualized pacer leads in the right heart. Umbilical hernia containing fat and small ascitic fluid. Right inguinal hernia containing ascitic fluid Hepatobiliary: No focal liver abnormality.Layering sludge gallstones. No evidence of acute cholecystitis. Pancreas: Lobulated mass in the pancreatic head and neck with extensive pre pancreatic component. There is peripheral calcifications and central low-density. The mass measures roughly 6 x 4 x 7 cm. 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 There is no  evidence of main duct enlargement. Spleen: Unremarkable. Adrenals/Urinary Tract: Negative adrenals. No hydronephrosis or stone. Lobulated renal cortical thinning. Unremarkable bladder. Reproductive:Hysterectomy.  Symmetric adnexae. Stomach/Bowel:  No obstruction. No appendicitis. Vascular/Lymphatic: No acute vascular abnormality. No mass or adenopathy. Peritoneal: No ascites or pneumoperitoneum. Musculoskeletal: No acute abnormalities. Advanced facet arthropathy with grade 1 L4-5 anterolisthesis. IMPRESSION: The reported pancreatic mass measures 6 x 4 x 7 cm and has a cystic appearance. If there is available outside imaging, would gladly make comparison. MRI (without contrast due to renal failure) could determine the internal architecture. The mass is also amenable to EUS with potential aspiration. Electronically Signed   By: Marnee Spring M.D.   On: 11/29/2015 04:11    Latest Echo  Latest Cath   Medications:     Scheduled Medications: . acetaZOLAMIDE  500 mg Oral Q12H  . allopurinol  100 mg Oral Daily  . antiseptic oral rinse  7 mL Mouth Rinse BID  . carvedilol  12.5 mg Oral BID WC  . ferrous sulfate  325 mg Oral Q breakfast  . metolazone  2.5 mg Oral BID  . potassium chloride SA  40 mEq Oral BID  . rivaroxaban  15 mg Oral Q1200  . sodium chloride  3 mL Intravenous Q12H  . verapamil  180 mg Oral Daily    Infusions: . furosemide (LASIX) infusion 15 mg/hr (11/29/15 0439)    PRN Medications: sodium chloride, acetaminophen, ondansetron (ZOFRAN) IV, sodium chloride   Assessment/Plan   Anita Wiley is a 74 y.o. female with history of diastolic CHF, HTN, gout, and bradycardia s/p Medtronic dual chamber pacemaker in 2010 who presented to [facility] with worsening SOB and pedal edema. HF team consulted with A/C diastolic CHF.   1. 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   She developed volume  overload in the setting of getting the wrong diuretic and dose at a rehab facility recently.  - She feels like she is peeing a lot but weight up. ? Accuracy. Will continue diamox and increase lasix gtrt to 20/hr. Will need Foley for safety. Creatinine stable - Can consider repeat RHC as needed 2. Acute on chronic respiratory failure: Improved on 02 via [place]. 3. CKD stage IV: Creatinine stable. - Monitor closely with diuresis. Currently stable 4. 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  5. 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  - Continue to diurese as able. May need repeat RHC 6. HTN: BP stable on current meds. 7. PAF: Noted on device interrogation that she has been in and out of afib.  This patient's CHA2DS2-VASc is at least 4. - Continue xarelto 15 mg daily .   8. Large pancreatic mass. I reviewed CT personally. Will defer management at this time to primary team. (I d/w Dr. Radonna Ricker) Consider MRI to further evaluate mostly for prognostic information as she is likely not candidate for any intervention.  Length of Stay: 7  Bensimhon, Daniel MD 11/29/2015, 9:15 AM  Advanced Heart Failure Team Pager [phone] (M-F; 7a - 4p)  Please contact [facility] for night-coverage after hours (4p -7a ) and weekends on [email or web address]

## 2015-11-29 NOTE — Progress Notes (Signed)
CARDIAC REHAB PHASE I   PRE:  Rate/Rhythm: 62 paced  BP:  Lying: 103/60       SaO2: 100 2L  MODE:  Ambulation: 8 ft   Pt lying in bed, agreeable to walk, required significant assistance to go from lying to sitting on edge of bed, required use of gait belt to stand. Pt states she is sad today, states her family just informed her that her sister passed away. Pt ambulated to door on 2L O2, slow, mildly unsteady gait, tolerated fair. Pt ambulated to door, states she felt tired, was met by physical therapy, who at that time assumed ambulation with pt. Pt will benefit from continued physical therapy as she is weak and has limited activity tolerance. Will continue to follow for additional ambulation/education.   [id number] Monge, Emily C, RN, BSN 11/29/2015 3:04 PM 

## 2015-11-29 NOTE — Progress Notes (Addendum)
[facility] PROGRESS NOTE    Progress Note   Anita Wiley [id number] DOB: 10/08/1941 DOA: 11/22/2015 PCP: Angela AdamAHMED,SYED A, MD   Brief Narrative:   Anita HaagensenVenna Wiley is an 74 y.o. female past medical history of chronic dystonic heart failure, CJD stage IV, with a pacemaker presents to the hospital with dyspnea was found to have be in acute diastolic heart failure.  Assessment/Plan:   Acute respiratory failure (HCC) due to Acute on chronic diastolic heart failure [facility](HCC): The advanced heart failure team was consulted, on IV Lasix Gtt and metolazone, demadex added on 12.21.2016. Cardiology on board. Her weight did not improved today. Reviewing her chart her weight has been as low as 88-86 kg. Further management per AHFT.  S/P placement of cardiac pacemaker  Essential HTN (hypertension) Continue Coreg and verapamil, at goal.  Coronary artery disease, non-occlusive by cath Cont ASA.  CKD (chronic kidney disease) stage 4, GFR 15-29 ml/min (HCC) Continue to monitor kidney function. On admission renal function was 2.4 Back in August 2016 her creatinine was ranging around 1.3-1.5.  Gout: Continue on a allopurinol.  Iron deficiency anemia: continue her sulfate.  Pancreatic mass: CT abd and pelvis showed, Cannot perform MRI due to pacer. Will d/w Cards and GI if EUS is an option, due to her multiple co morbidities.    DVT Prophylaxis - Lovenox ordered.  Family Communication: none Disposition Plan: Home 3-4 days Code Status:     Code Status Orders        Start     Ordered   11/22/15 1707  Do not attempt resuscitation (DNR)   Continuous    Question Answer Comment  In the event of cardiac or respiratory ARREST Do not call a "code blue"   In the event of cardiac or respiratory ARREST Do not perform Intubation, CPR, defibrillation or ACLS   In the event of cardiac or respiratory ARREST Use medication by any route, position, wound care, and other measures to relive pain and  suffering. May use oxygen, suction and manual treatment of airway obstruction as needed for comfort.      11/22/15 1706        IV Access:    Peripheral IV   Procedures and diagnostic studies:   Ct Abdomen Pelvis Wo Contrast  11/29/2015  CLINICAL DATA:  Abdominal pain and history of pancreatic mass EXAM: CT ABDOMEN AND PELVIS WITHOUT CONTRAST TECHNIQUE: Multidetector CT imaging of the abdomen and pelvis was performed following the standard protocol without IV contrast. COMPARISON:  None. FINDINGS: Lower chest and abdominal wall: Anasarca. Cardiomegaly. Trace bilateral pleural effusion. Partly visualized pacer leads in the right heart. Umbilical hernia containing fat and small ascitic fluid. Right inguinal hernia containing ascitic fluid Hepatobiliary: No focal liver abnormality.Layering sludge gallstones. No evidence of acute cholecystitis. Pancreas: Lobulated mass in the pancreatic head and neck with extensive pre pancreatic component. There is peripheral calcifications and central low-density. The mass measures roughly 6 x 4 x 7 cm. 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 There is no evidence of main duct enlargement. Spleen: Unremarkable. Adrenals/Urinary Tract: Negative adrenals. No hydronephrosis or stone. Lobulated renal cortical thinning. Unremarkable bladder. Reproductive:Hysterectomy.  Symmetric adnexae. Stomach/Bowel:  No obstruction. No appendicitis. Vascular/Lymphatic: No acute vascular abnormality. No mass or adenopathy. Peritoneal: No ascites or pneumoperitoneum. Musculoskeletal: No acute abnormalities. Advanced facet arthropathy with grade 1 L4-5 anterolisthesis. IMPRESSION: The reported pancreatic mass measures 6 x 4 x 7 cm and has a cystic appearance. If there is available outside imaging, would gladly  make comparison. MRI (without contrast due to renal failure) could determine the internal architecture. The mass is also amenable to EUS with potential aspiration. Electronically  Signed   By: Marnee Spring M.D.   On: 11/29/2015 04:11     Medical Consultants:    None.  Anti-Infectives:   Anti-infectives    None      Subjective:    Anita Wiley she relates no new complains.  Objective:    Filed Vitals:   11/28/15 1300 11/28/15 1949 11/29/15 0503 11/29/15 1037  BP: 111/48 118/57 140/68 104/51  Pulse: 59 60 63   Temp: 97.8 F (36.6 C) 98.1 F (36.7 C) 97.9 F (36.6 C)   TempSrc: Oral Oral Oral   Resp: Height:      Weight:   95.709 kg (211 lb)   SpO2: 98% 97% 96%     Intake/Output Summary (Last 24 hours) at 11/29/15 1051 Last data filed at 11/29/15 1039  Gross per 24 hour  Intake 2120.5 ml  Output   4000 ml  Net -1879.5 ml   Filed Weights   11/27/15 0720 11/28/15 0713 11/29/15 0503  Weight: 94.711 kg (208 lb 12.8 oz) 94.892 kg (209 lb 3.2 oz) 95.709 kg (211 lb)    Exam: Gen:  NAD Cardiovascular:  RRR. Chest and lungs:   CTAB Abdomen:  Abdomen soft, NT/ND, + BS Extremities:  2+ lower ext edema.   Data Reviewed:    Labs: Basic Metabolic Panel:  Recent Labs Lab 11/23/15 0602  11/25/15 0250 11/26/15 0332 11/27/15 0535 11/28/15 1034 11/29/15 0420  NA 140  < > 138 138 139 141 135  K 3.7  < > 4.2 4.2 4.0 3.9 3.5  CL 103  < > 100* 97* 95* 96* 88*  CO2 29  < > 30 33* 34* 36* 35*  GLUCOSE 82  < > 101* 94 91 98 110*  BUN 39*  < > 42* 45* 47* 52* 51*  CREATININE 2.41*  < > 2.39* 2.27* 2.21* 2.17* 2.22*  CALCIUM 8.9  < > 8.9 8.8* 9.4 9.0 9.1  MG 1.9  --   --   --   --   --   --   < > = values in this interval not displayed. GFR Estimated Creatinine Clearance: 24.2 mL/min (by C-G formula based on Cr of 2.22). Liver Function Tests: No results for input(s): AST, ALT, ALKPHOS, BILITOT, PROT, ALBUMIN in the last 168 hours. No results for input(s): LIPASE, AMYLASE in the last 168 hours. No results for input(s): AMMONIA in the last 168 hours. Coagulation profile No results for input(s): INR, PROTIME in the last 168  hours.  CBC:  Recent Labs Lab 11/22/15 1308 11/27/15 0535  WBC 6.9 5.9  HGB 10.2* 9.2*  HCT 32.5* 29.5*  MCV 91.0 91.3  PLT 130* 128*   Cardiac Enzymes:  Recent Labs Lab 11/22/15 1830 11/22/15 2357 11/23/15 0602  TROPONINI 0.05* 0.04* 0.05*   BNP (last 3 results) No results for input(s): PROBNP in the last 8760 hours. CBG:  Recent Labs Lab 11/27/15 2111 11/28/15 0658 11/28/15 1151 11/28/15 1642 11/29/15 0541  GLUCAP 106* 85 84 117* 102*   D-Dimer: No results for input(s): DDIMER in the last 72 hours. Hgb A1c: No results for input(s): HGBA1C in the last 72 hours. Lipid Profile: No results for input(s): CHOL, HDL, LDLCALC, TRIG, CHOLHDL, LDLDIRECT in the last 72 hours. Thyroid function studies: No results for input(s): TSH, T4TOTAL, T3FREE, THYROIDAB  in the last 72 hours.  Invalid input(s): FREET3 Anemia work up: No results for input(s): VITAMINB12, FOLATE, FERRITIN, TIBC, IRON, RETICCTPCT in the last 72 hours. Sepsis Labs:  Recent Labs Lab 11/22/15 1308 11/27/15 0535  WBC 6.9 5.9   Microbiology No results found for this or any previous visit (from the past 240 hour(s)).   Medications:   . acetaZOLAMIDE  500 mg Oral Q12H  . allopurinol  100 mg Oral Daily  . antiseptic oral rinse  7 mL Mouth Rinse BID  . carvedilol  12.5 mg Oral BID WC  . ferrous sulfate  325 mg Oral Q breakfast  . metolazone  2.5 mg Oral BID  . potassium chloride SA  40 mEq Oral BID  . rivaroxaban  15 mg Oral Q1200  . sodium chloride  3 mL Intravenous Q12H  . verapamil  180 mg Oral Daily   Continuous Infusions: . furosemide (LASIX) infusion 20 mg/hr (11/29/15 1039)    Time spent: 15 min   LOS: 7 days   Marinda Elk  [facility] Pager [phone]  *Please refer to [email or web address], password [email or web address] to get updated schedule on who will round on this patient, as hospitalists switch teams weekly. If 7PM-7AM, please contact night-coverage at [email or web address], password [email or web address]  for any overnight needs.  11/29/2015, 10:51 AM

## 2015-11-29 NOTE — Progress Notes (Signed)
Patient does not want to return to nursing facility and [facility] [place], [place]. Patient lives alone and her family members lives close by to assist in her care. Abelino DerrickB Taneah [facility] FairfaxRN,MHA,BSN [phone]

## 2015-11-29 NOTE — Care Management Important Message (Signed)
Important Message  Patient Details  Name: Anita HaagensenVenna Wiley [name]: [id number] Date of Birth: 01/29/1941   Medicare Important Message Given:  Yes    Ronalda Walpole P Magalie Almon 11/29/2015, 1:50 PM

## 2015-11-29 NOTE — Progress Notes (Signed)
Physical Therapy Treatment Patient Details Name: Anita Wiley [name]: [id number] DOB: 1941/07/01 Today's Date: 11/29/2015    History of Present Illness 74 y.o. female past medical history that includes CHF, hypertension, CAD, presents to the emergency department with chief complaint of gradual worsening shortness of breath and lower extremity edema. Initial evaluation in the emergency department reveals acute respiratory failure related to acute on chronic heart failure    PT Comments    Patient with decreased ambulation tolerance today with patient reporting that she is not feeling as strong today. Patient also stating that she is hoping to be able to D/C to home from the hospital. At this time, PT recommending SNF based upon current mobility. Will continue to follow and progress as tolerated.      Follow Up Recommendations  SNF     Equipment Recommendations  None recommended by PT    Recommendations for Other Services       Precautions / Restrictions Precautions Precautions: Fall Restrictions Weight Bearing Restrictions: No    Mobility  Bed Mobility               General bed mobility comments: just getting out of bed with cardiac rehab.   Transfers Overall transfer level: Needs assistance Equipment used: Rolling walker (2 wheeled) Transfers: Sit to/from Stand           General transfer comment: stand to sit with min assist and cues for hand placement.  Ambulation/Gait Ambulation/Gait assistance: Min assist Ambulation Distance (Feet): 60 Feet Assistive device: Rolling walker (2 wheeled) Gait Pattern/deviations: Step-through pattern;Decreased step length - right;Decreased step length - left Gait velocity: decreased   General Gait Details: on 2L O2, SpO2 99% in sitting after ambulation, 2 standing breaks.   Stairs            Wheelchair Mobility    Modified Rankin (Stroke Patients Only)       Balance Overall balance assessment: Needs assistance          Standing balance support: Bilateral upper extremity supported Standing balance-Leahy Scale: Poor Standing balance comment: using rw                    Cognition Arousal/Alertness: Awake/alert Behavior During Therapy: WFL for tasks assessed/performed Overall Cognitive Status: Within Functional Limits for tasks assessed                      Exercises      General Comments        Pertinent Vitals/Pain Pain Assessment: Faces Pain Score: 0-No pain    Home Living                      Prior Function            PT Goals (current goals can now be found in the care plan section) Progress towards PT goals: Progressing toward goals    Frequency  Min 2X/week    PT Plan Current plan remains appropriate    Co-evaluation             End of Session Equipment Utilized During Treatment: Gait belt;Oxygen Activity Tolerance: Patient limited by fatigue Patient left: in chair;with call bell/phone within reach     Time: 9629-5284 PT Time Calculation (min) (ACUTE ONLY): 18 min  Charges:  $Gait Training: 8-22 mins                    G Codes:  Christiane HaBenjamin J. Rhilynn Preyer, PT, CSCS Pager [phone] Office [phone]  11/29/2015, 4:02 PM

## 2015-11-30 DIAGNOSIS — I480 Paroxysmal atrial fibrillation: Secondary | ICD-10-CM | POA: Insufficient documentation

## 2015-11-30 LAB — BASIC METABOLIC PANEL
ANION GAP: 9 (ref 5–15)
BUN: 54 mg/dL — ABNORMAL HIGH (ref 6–20)
CALCIUM: 8.8 mg/dL — AB (ref 8.9–10.3)
CO2: 39 mmol/L — AB (ref 22–32)
Chloride: 86 mmol/L — ABNORMAL LOW (ref 101–111)
Creatinine, Ser: 2.28 mg/dL — ABNORMAL HIGH (ref 0.44–1.00)
GFR, EST AFRICAN AMERICAN: 23 mL/min — AB (ref >60)
GFR, EST NON AFRICAN AMERICAN: 20 mL/min — AB (ref >60)
GLUCOSE: 89 mg/dL (ref 65–99)
POTASSIUM: 3.4 mmol/L — AB (ref 3.5–5.1)
Sodium: 134 mmol/L — ABNORMAL LOW (ref 135–145)

## 2015-11-30 NOTE — Progress Notes (Signed)
CARDIAC REHAB PHASE I   Pt ambulating with PT. Upon returning to room reviewed CHF education with pt. Discussed CHF booklet and zone tool, daily weights, s/s heart failure, sodium and fluid restrictions. Pt verbalized understanding, very hard of hearing, difficult to education, able to perform some teach back, needs reinforcement of education as possible. Pt states she is sad following the death of her sister and hopes to be discharged in time to attend her funeral next Wednesday. Pt in recliner, call bell within reach. Will follow-up this afternoon as schedule permits for additional ambulation.    [id number] Joylene GrapesMonge, Nevena Rozenberg C, RN, BSN 11/30/2015 10:59 AM

## 2015-11-30 NOTE — Progress Notes (Addendum)
[facility] PROGRESS NOTE    Progress Note   Elisse Pennick WUJ:[id number] DOB: 1941/07/10 DOA: 11/22/2015 PCP: Angela Adam, MD   Brief Narrative:   Nihal Doan is an 74 y.o. female past medical history of chronic dystonic heart failure, CJD stage IV, with a pacemaker presents to the hospital with dyspnea was found to have be in acute diastolic heart failure.  Assessment/Plan:   Acute respiratory failure (HCC) due to Acute on chronic diastolic heart failure [facility]): The advanced heart failure team was consulted, on IV Lasix Gtt and metolazone, demadex added on 12.21.2016. Brisk diuresis, continue fluid restrictions and Strict I's and O's. Cardiology on board. Her weight is improving. Reviewing her chart her weight has been as low as 88-86 kg. Further management per AHFT.  S/P placement of cardiac pacemaker  Essential HTN (hypertension) Continue Coreg and verapamil, at goal. Blood pressures remained stable with diuresis.  Coronary artery disease, non-occlusive by cath Cont ASA. She's asymptomatic.  CKD (chronic kidney disease) stage 4, GFR 15-29 ml/min (HCC) Continue to monitor kidney function. On admission renal function was 2.4 Back in August 2016 her creatinine was ranging around 1.3-1.5. Continue to monitor creatinine.  Gout: Continue on a allopurinol.  Iron deficiency anemia: continue her sulfate.  Pancreatic mass: CT abd and pelvis showed, Cannot perform MRI due to pacer. Will d/w Cards and GI if EUS is an option, due to her multiple co morbidities.    DVT Prophylaxis - Lovenox ordered.  Family Communication: none Disposition Plan: Home 3 days Code Status:     Code Status Orders        Start     Ordered   11/22/15 1707  Do not attempt resuscitation (DNR)   Continuous    Question Answer Comment  In the event of cardiac or respiratory ARREST Do not call a "code blue"   In the event of cardiac or respiratory ARREST Do not perform Intubation, CPR,  defibrillation or ACLS   In the event of cardiac or respiratory ARREST Use medication by any route, position, wound care, and other measures to relive pain and suffering. May use oxygen, suction and manual treatment of airway obstruction as needed for comfort.      11/22/15 1706        IV Access:    Peripheral IV   Procedures and diagnostic studies:   Ct Abdomen Pelvis Wo Contrast  11/29/2015  CLINICAL DATA:  Abdominal pain and history of pancreatic mass EXAM: CT ABDOMEN AND PELVIS WITHOUT CONTRAST TECHNIQUE: Multidetector CT imaging of the abdomen and pelvis was performed following the standard protocol without IV contrast. COMPARISON:  None. FINDINGS: Lower chest and abdominal wall: Anasarca. Cardiomegaly. Trace bilateral pleural effusion. Partly visualized pacer leads in the right heart. Umbilical hernia containing fat and small ascitic fluid. Right inguinal hernia containing ascitic fluid Hepatobiliary: No focal liver abnormality.Layering sludge gallstones. No evidence of acute cholecystitis. Pancreas: Lobulated mass in the pancreatic head and neck with extensive pre pancreatic component. There is peripheral calcifications and central low-density. The mass measures roughly 6 x 4 x 7 cm. 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 There is no evidence of main duct enlargement. Spleen: Unremarkable. Adrenals/Urinary Tract: Negative adrenals. No hydronephrosis or stone. Lobulated renal cortical thinning. Unremarkable bladder. Reproductive:Hysterectomy.  Symmetric adnexae. Stomach/Bowel:  No obstruction. No appendicitis. Vascular/Lymphatic: No acute vascular abnormality. No mass or adenopathy. Peritoneal: No ascites or pneumoperitoneum. Musculoskeletal: No acute abnormalities. Advanced facet arthropathy with grade 1 L4-5 anterolisthesis. IMPRESSION: The reported pancreatic mass  measures 6 x 4 x 7 cm and has a cystic appearance. If there is available outside imaging, would gladly make comparison.  MRI (without contrast due to renal failure) could determine the internal architecture. The mass is also amenable to EUS with potential aspiration. Electronically Signed   By: Marnee Spring M.D.   On: 11/29/2015 04:11     Medical Consultants:    None.  Anti-Infectives:   Anti-infectives    None      Subjective:    Mady Haagensen she relates no new complains.  Objective:    Filed Vitals:   11/29/15 1207 11/29/15 1732 11/29/15 2100 11/30/15 0546  BP: 104/57 103/42 109/44 113/59  Pulse: 59 61 62 59  Temp: 98.2 F (36.8 C)  97.8 F (36.6 C) 97.9 F (36.6 C)  TempSrc: Oral  Oral Oral  Resp: Height:      Weight:    91.763 kg (202 lb 4.8 oz)  SpO2: 100%  100% 100%    Intake/Output Summary (Last 24 hours) at 11/30/15 0824 Last data filed at 11/30/15 0648  Gross per 24 hour  Intake 1122.76 ml  Output   4150 ml  Net -3027.24 ml   Filed Weights   11/28/15 0713 11/29/15 0503 11/30/15 0546  Weight: 94.892 kg (209 lb 3.2 oz) 95.709 kg (211 lb) 91.763 kg (202 lb 4.8 oz)    Exam: Gen:  NAD Cardiovascular:  RRR. Chest and lungs:   CTAB Abdomen:  Abdomen soft, NT/ND, + BS Extremities:  2+ lower ext edema.   Data Reviewed:    Labs: Basic Metabolic Panel:  Recent Labs Lab 11/26/15 0332 11/27/15 0535 11/28/15 1034 11/29/15 0420 11/30/15 0506  NA 138 139 141 135 134*  K 4.2 4.0 3.9 3.5 3.4*  CL 97* 95* 96* 88* 86*  CO2 33* 34* 36* 35* 39*  GLUCOSE 94 91 98 110* 89  BUN 45* 47* 52* 51* 54*  CREATININE 2.27* 2.21* 2.17* 2.22* 2.28*  CALCIUM 8.8* 9.4 9.0 9.1 8.8*   GFR Estimated Creatinine Clearance: 23 mL/min (by C-G formula based on Cr of 2.28). Liver Function Tests: No results for input(s): AST, ALT, ALKPHOS, BILITOT, PROT, ALBUMIN in the last 168 hours. No results for input(s): LIPASE, AMYLASE in the last 168 hours. No results for input(s): AMMONIA in the last 168 hours. Coagulation profile No results for input(s): INR, PROTIME in the last  168 hours.  CBC:  Recent Labs Lab 11/27/15 0535  WBC 5.9  HGB 9.2*  HCT 29.5*  MCV 91.3  PLT 128*   Cardiac Enzymes: No results for input(s): CKTOTAL, CKMB, CKMBINDEX, TROPONINI in the last 168 hours. BNP (last 3 results) No results for input(s): PROBNP in the last 8760 hours. CBG:  Recent Labs Lab 11/28/15 1642 11/29/15 0541 11/29/15 1211 11/29/15 1629 11/29/15 2115  GLUCAP 117* 102* 90 115* 98   D-Dimer: No results for input(s): DDIMER in the last 72 hours. Hgb A1c: No results for input(s): HGBA1C in the last 72 hours. Lipid Profile: No results for input(s): CHOL, HDL, LDLCALC, TRIG, CHOLHDL, LDLDIRECT in the last 72 hours. Thyroid function studies: No results for input(s): TSH, T4TOTAL, T3FREE, THYROIDAB in the last 72 hours.  Invalid input(s): FREET3 Anemia work up: No results for input(s): VITAMINB12, FOLATE, FERRITIN, TIBC, IRON, RETICCTPCT in the last 72 hours. Sepsis Labs:  Recent Labs Lab 11/27/15 0535  WBC 5.9   Microbiology No results found for this or any previous visit (from the past  240 hour(s)).   Medications:   . acetaZOLAMIDE  500 mg Oral Q12H  . allopurinol  100 mg Oral Daily  . antiseptic oral rinse  7 mL Mouth Rinse BID  . carvedilol  12.5 mg Oral BID WC  . ferrous sulfate  325 mg Oral Q breakfast  . metolazone  2.5 mg Oral BID  . potassium chloride SA  40 mEq Oral BID  . rivaroxaban  15 mg Oral Q1200  . sodium chloride  3 mL Intravenous Q12H  . verapamil  180 mg Oral Daily   Continuous Infusions: . furosemide (LASIX) infusion 20 mg/hr (11/29/15 2133)    Time spent: 15 min   LOS: 8 days   Marinda ElkFELIZ ORTIZ, ABRAHAM  [facility] Pager [phone]  *Please refer to [email or web address], password [email or web address] to get updated schedule on who will round on this patient, as hospitalists switch teams weekly. If 7PM-7AM, please contact night-coverage at [email or web address], password [email or web address] for any overnight needs.  11/30/2015, 8:24 AM

## 2015-11-30 NOTE — Progress Notes (Signed)
Advanced Heart Failure Rounding Note  Primary HF: Dr. Shirlee Latch    Subjective:    Brisk diuresis noted on Acetazolamide, lasix drip, and metolazone. Weight down 9 pounds.    Denies SOB  CT of abdomen shows large cystic pancreatic mass.   Objective:   Weight Range: 202 lb 4.8 oz (91.763 kg) Body mass index is 34.71 kg/(m^2).   Vital Signs:   Temp:  [97.8 F (36.6 C)-98.2 F (36.8 C)] 97.9 F (36.6 C) (12/23 0546) Pulse Rate:  [59-62] 59 (12/23 0546) Resp:  [17-18] 18 (12/23 0546) BP: (103-113)/(42-59) 113/59 mmHg (12/23 0546) SpO2:  [100 %] 100 % (12/23 0546) Weight:  [202 lb 4.8 oz (91.763 kg)] 202 lb 4.8 oz (91.763 kg) (12/23 0546) Last BM Date: 11/27/15  Weight change: Filed Weights   11/28/15 0713 11/29/15 0503 11/30/15 0546  Weight: 209 lb 3.2 oz (94.892 kg) 211 lb (95.709 kg) 202 lb 4.8 oz (91.763 kg)    Intake/Output:   Intake/Output Summary (Last 24 hours) at 11/30/15 0921 Last data filed at 11/30/15 4098  Gross per 24 hour  Intake 1122.76 ml  Output   3650 ml  Net -2527.24 ml     Physical Exam: General: Chronically ill and elderly appearing. NAD. In bed HEENT: normal except for poor dentition, 02 3 lpm via [place]. Neck: supple. JVP  9-10 . Carotids 2+ bilat; no bruits. No thyromegaly or nodule noted.  Cor: PMI nondisplaced. RRR. 2/6 TR, widely split S2.  Lungs: Bibasilar crackles, diminished breath sounds Abdomen: Obese soft, non-tender, + distention, no HSM. No bruits or masses. Bowel sounds presents x 4.  Extremities: no cyanosis, clubbing, rash, Trace -1+  edema to knees bilaterally.  Neuro: A&Ox3, cranial nerves grossly intact. moves all 4 extremities w/o difficulty. Affect pleasant  Telemetry: AV paced in 60s  Labs: CBC No results for input(s): WBC, NEUTROABS, HGB, HCT, MCV, PLT in the last 72 hours. Basic Metabolic Panel  Recent Labs  11/29/15 0420 11/30/15 0506  NA 135 134*  K 3.5 3.4*  CL 88* 86*  CO2 35* 39*  GLUCOSE 110* 89  BUN 51*  54*  CREATININE 2.22* 2.28*  CALCIUM 9.1 8.8*   Liver Function Tests No results for input(s): AST, ALT, ALKPHOS, BILITOT, PROT, ALBUMIN in the last 72 hours. No results for input(s): LIPASE, AMYLASE in the last 72 hours. Cardiac Enzymes No results for input(s): CKTOTAL, CKMB, CKMBINDEX, TROPONINI in the last 72 hours.  BNP: BNP (last 3 results)  Recent Labs  09/11/15 1726 10/03/15 1259 11/22/15 1308  BNP 1228.8* 662.7* 1370.0*    ProBNP (last 3 results) No results for input(s): PROBNP in the last 8760 hours.   D-Dimer No results for input(s): DDIMER in the last 72 hours. Hemoglobin A1C No results for input(s): HGBA1C in the last 72 hours. Fasting Lipid Panel No results for input(s): CHOL, HDL, LDLCALC, TRIG, CHOLHDL, LDLDIRECT in the last 72 hours. Thyroid Function Tests No results for input(s): TSH, T4TOTAL, T3FREE, THYROIDAB in the last 72 hours.  Invalid input(s): FREET3  Other results:     Imaging/Studies:  Ct Abdomen Pelvis Wo Contrast  11/29/2015  CLINICAL DATA:  Abdominal pain and history of pancreatic mass EXAM: CT ABDOMEN AND PELVIS WITHOUT CONTRAST TECHNIQUE: Multidetector CT imaging of the abdomen and pelvis was performed following the standard protocol without IV contrast. COMPARISON:  None. FINDINGS: Lower chest and abdominal wall: Anasarca. Cardiomegaly. Trace bilateral pleural effusion. Partly visualized pacer leads in the right heart. Umbilical hernia containing fat and  small ascitic fluid. Right inguinal hernia containing ascitic fluid Hepatobiliary: No focal liver abnormality.Layering sludge gallstones. No evidence of acute cholecystitis. Pancreas: Lobulated mass in the pancreatic head and neck with extensive pre pancreatic component. There is peripheral calcifications and central low-density. The mass measures roughly 6 x 4 x 7 cm. 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 There is no evidence of main duct enlargement. Spleen: Unremarkable.  Adrenals/Urinary Tract: Negative adrenals. No hydronephrosis or stone. Lobulated renal cortical thinning. Unremarkable bladder. Reproductive:Hysterectomy.  Symmetric adnexae. Stomach/Bowel:  No obstruction. No appendicitis. Vascular/Lymphatic: No acute vascular abnormality. No mass or adenopathy. Peritoneal: No ascites or pneumoperitoneum. Musculoskeletal: No acute abnormalities. Advanced facet arthropathy with grade 1 L4-5 anterolisthesis. IMPRESSION: The reported pancreatic mass measures 6 x 4 x 7 cm and has a cystic appearance. If there is available outside imaging, would gladly make comparison. MRI (without contrast due to renal failure) could determine the internal architecture. The mass is also amenable to EUS with potential aspiration. Electronically Signed   By: Marnee SpringJonathon  Watts M.D.   On: 11/29/2015 04:11    Latest Echo  Latest Cath   Medications:     Scheduled Medications: . acetaZOLAMIDE  500 mg Oral Q12H  . allopurinol  100 mg Oral Daily  . antiseptic oral rinse  7 mL Mouth Rinse BID  . carvedilol  12.5 mg Oral BID WC  . ferrous sulfate  325 mg Oral Q breakfast  . metolazone  2.5 mg Oral BID  . potassium chloride SA  40 mEq Oral BID  . rivaroxaban  15 mg Oral Q1200  . sodium chloride  3 mL Intravenous Q12H  . verapamil  180 mg Oral Daily    Infusions: . furosemide (LASIX) infusion 20 mg/hr (11/29/15 2133)    PRN Medications: sodium chloride, acetaminophen, ondansetron (ZOFRAN) IV, sodium chloride   Assessment/Plan   Mady HaagensenVenna Cermak is a 74 y.o. female with history of diastolic CHF, HTN, gout, and bradycardia s/p Medtronic dual chamber pacemaker in 2010 [facility] and pedal edema. HF team consulted with A/C diastolic CHF.   1. 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   She developed volume overload in the setting of getting the wrong diuretic and dose  at a rehab facility recently.  Brisk diuresis noted. Weight down 9 pounds. Continue diamox + lasix gtt @ 20 mg per hour + metolazone. Anticipate switching to torsemide 60 mg twice a day. (prior to admit torsemide 40 mg twice a day) - Can consider repeat RHC as needed 2. Acute on chronic respiratory failure: Improved on 02 via [place]. 3. CKD stage IV: Creatinine stable. - Monitor closely with diuresis. Currently stable 4. 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  5. 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  - Continue to diurese as able. May need repeat RHC 6. HTN: BP stable on current meds. 7. PAF: Noted on device interrogation that she has been in and out of afib.  This patient's CHA2DS2-VASc is at least 4. - Continue xarelto 15 mg daily .   8. Large pancreatic mass.  Will defer management at this time to primary team. (I d/w Dr. Radonna RickerFeliz) Consider MRI to further evaluate mostly for prognostic information as she is likely not candidate for any intervention.    Length of Stay: 8  Amy Clegg NP-C  11/30/2015, 9:21 AM  Advanced Heart Failure Team Pager [phone] (M-F; 7a - 4p)  Please contact [facility] for night-coverage after hours (4p -7a ) and weekends on [email or web address]   Patient seen and examined with Tonye Becket, NP. We discussed all aspects of the encounter. I agree with the assessment and plan as stated above.   Diuresis has picked up on current regimen. In looking back at previous flowsheets baseline weight 180-185 so we still have a way to go. Renal function stable. Will continue current regimen. Supp K+. Unable to get MRI for pancreatic mass due to PPM. Continue Xarelto for PAF.   Cadell Gabrielson,MD 3:32 PM

## 2015-11-30 NOTE — Progress Notes (Signed)
Physical Therapy Treatment Patient Details Name: Anita Wiley [name]: [id number] DOB: [birth year] Today's Date: 11/30/2015    History of Present Illness 74 y.o. female past medical history that includes CHF, hypertension, CAD, presents to the emergency department with chief complaint of gradual worsening shortness of breath and lower extremity edema. Initial evaluation in the emergency department reveals acute respiratory failure related to acute on chronic heart failure    PT Comments    Pt progressing with mobility, she tolerated increased overall gait distance of 58' with RW. SaO2 95% on RA walking, 98% on RA at rest, HR 60s with walking. 1-2/4 dyspnea walking. Mod assist for supine to sit.   Follow Up Recommendations  Home health PT (pt declines SNF, HHPT recommended)     Equipment Recommendations  None recommended by PT    Recommendations for Other Services       Precautions / Restrictions Precautions Precautions: Fall Restrictions Weight Bearing Restrictions: No    Mobility  Bed Mobility   Bed Mobility: Supine to Sit     Supine to sit: Mod assist;HOB elevated     General bed mobility comments: mod A to raise trunk, increased time, HOB up 30*  Transfers Overall transfer level: Needs assistance Equipment used: Rolling walker (2 wheeled) Transfers: Sit to/from Stand Sit to Stand: Min assist         General transfer comment: stand to sit with min assist and cues for hand placement.  Ambulation/Gait Ambulation/Gait assistance: Supervision Ambulation Distance (Feet): 95 Feet Assistive device: Rolling walker (2 wheeled) Gait Pattern/deviations: Step-through pattern Gait velocity: decreased   General Gait Details: SaO2 95% on RA with walking, HR 60, no LOB, 1 standing rest break   Stairs            Wheelchair Mobility    Modified Rankin (Stroke Patients Only)       Balance             Standing balance-Leahy Scale: Poor                       Cognition Arousal/Alertness: Awake/alert Behavior During Therapy: WFL for tasks assessed/performed Overall Cognitive Status: Within Functional Limits for tasks assessed                      Exercises      General Comments        Pertinent Vitals/Pain Pain Assessment: No/denies pain    Home Living                      Prior Function            PT Goals (current goals can now be found in the care plan section) Acute Rehab PT Goals Patient Stated Goal: walk on hallway PT Goal Formulation: With patient Time For Goal Achievement: 12/07/15 Potential to Achieve Goals: Good Progress towards PT goals: Progressing toward goals    Frequency  Min 2X/week    PT Plan Current plan remains appropriate    Co-evaluation             End of Session Equipment Utilized During Treatment: Gait belt Activity Tolerance: Patient tolerated treatment well Patient left: in chair;with call bell/phone within reach     Time: 1024-1046 PT Time Calculation (min) (ACUTE ONLY): 22 min  Charges:  $Gait Training: 8-22 mins                    G  Codes:      Tamala SerUhlenberg, Richey Doolittle Kistler 11/30/2015, 10:54 AM [phone]

## 2015-12-01 LAB — BASIC METABOLIC PANEL
ANION GAP: 13 (ref 5–15)
BUN: 61 mg/dL — ABNORMAL HIGH (ref 6–20)
CALCIUM: 8.7 mg/dL — AB (ref 8.9–10.3)
CO2: 36 mmol/L — AB (ref 22–32)
Chloride: 85 mmol/L — ABNORMAL LOW (ref 101–111)
Creatinine, Ser: 2.3 mg/dL — ABNORMAL HIGH (ref 0.44–1.00)
GFR calc non Af Amer: 19 mL/min — ABNORMAL LOW (ref >60)
GFR, EST AFRICAN AMERICAN: 23 mL/min — AB (ref >60)
Glucose, Bld: 93 mg/dL (ref 65–99)
POTASSIUM: 3.3 mmol/L — AB (ref 3.5–5.1)
Sodium: 134 mmol/L — ABNORMAL LOW (ref 135–145)

## 2015-12-01 LAB — CBC
HCT: 27.3 % — ABNORMAL LOW (ref 36.0–46.0)
HEMOGLOBIN: 8.7 g/dL — AB (ref 12.0–15.0)
MCH: 28.9 pg (ref 26.0–34.0)
MCHC: 31.9 g/dL (ref 30.0–36.0)
MCV: 90.7 fL (ref 78.0–100.0)
Platelets: 134 10*3/uL — ABNORMAL LOW (ref 150–400)
RBC: 3.01 MIL/uL — AB (ref 3.87–5.11)
RDW: 16.2 % — ABNORMAL HIGH (ref 11.5–15.5)
WBC: 5.8 10*3/uL (ref 4.0–10.5)

## 2015-12-01 LAB — HEPATIC FUNCTION PANEL
ALBUMIN: 2.8 g/dL — AB (ref 3.5–5.0)
ALK PHOS: 100 U/L (ref 38–126)
ALT: 17 U/L (ref 14–54)
AST: 26 U/L (ref 15–41)
BILIRUBIN INDIRECT: 0.5 mg/dL (ref 0.3–0.9)
Bilirubin, Direct: 0.2 mg/dL (ref 0.1–0.5)
TOTAL PROTEIN: 5.6 g/dL — AB (ref 6.5–8.1)
Total Bilirubin: 0.7 mg/dL (ref 0.3–1.2)

## 2015-12-01 LAB — GLUCOSE, CAPILLARY: Glucose-Capillary: 122 mg/dL — ABNORMAL HIGH (ref 65–99)

## 2015-12-01 NOTE — Progress Notes (Signed)
[facility] PROGRESS NOTE  Anita Wiley UEA:[id number] DOB: [birth year] DOA: 11/22/2015  PCP: Angela Adam, MD  Brief HPI: 74 year old female with a past medical history of chronic diastolic congestive heart failure, chronic kidney disease stage IV with a pacemaker, presented with shortness of breath and was hospitalized for further management of diastolic CHF.  Past medical history:  Past Medical History  Diagnosis Date  . Hypertension   . Bradycardia     s/p pacemaker in 2010  . Chronic kidney disease   . Gout   . Pacemaker 2010    MEDTRONIC VERSA PACEMAKER...NOT MRI SAFE  . Arthritis     OSTEO  . Gout   . CHF (congestive heart failure) [facility])     Consultants: Cardiology  Procedures: None  Antibiotics: None  Subjective: Patient feels better. Though she does remain short of breath. Eyes, any chest pain. No nausea or vomiting.  Objective: Vital Signs  Filed Vitals:   11/30/15 2042 12/01/15 0640 12/01/15 1010 12/01/15 1200  BP: 112/59 95/50 114/47 115/55  Pulse: 65 60  61  Temp: 98.7 F (37.1 C) 97.8 F (36.6 C)  98.4 F (36.9 C)  TempSrc: Oral Oral  Oral  Resp: Height:      Weight:  89.449 kg (197 lb 3.2 oz)    SpO2: 99% 98%  100%    Intake/Output Summary (Last 24 hours) at 12/01/15 1544 Last data filed at 12/01/15 1400  Gross per 24 hour  Intake 997.33 ml  Output   4300 ml  Net -3302.67 ml   Filed Weights   11/29/15 0503 11/30/15 0546 12/01/15 0640  Weight: 95.709 kg (211 lb) 91.763 kg (202 lb 4.8 oz) 89.449 kg (197 lb 3.2 oz)    General appearance: alert, cooperative, appears stated age and no distress Resp: Crackles bilateral bases. No wheezing, rhonchi. Cardio: regular rate and rhythm, S1, S2 normal, no murmur, click, rub or gallop GI: soft, non-tender; bowel sounds normal; no masses,  no organomegaly Extremities: Edema is present Neurologic: No focal deficits.  Lab Results:  Basic Metabolic Panel:  Recent Labs Lab  11/27/15 0535 11/28/15 1034 11/29/15 0420 11/30/15 0506 12/01/15 0351  NA 139 141 135 134* 134*  K 4.0 3.9 3.5 3.4* 3.3*  CL 95* 96* 88* 86* 85*  CO2 34* 36* 35* 39* 36*  GLUCOSE 91 98 110* 89 93  BUN 47* 52* 51* 54* 61*  CREATININE 2.21* 2.17* 2.22* 2.28* 2.30*  CALCIUM 9.4 9.0 9.1 8.8* 8.7*   Liver Function Tests:  Recent Labs Lab 12/01/15 1130  AST 26  ALT 17  ALKPHOS 100  BILITOT 0.7  PROT 5.6*  ALBUMIN 2.8*   CBC:  Recent Labs Lab 11/27/15 0535 12/01/15 0351  WBC 5.9 5.8  HGB 9.2* 8.7*  HCT 29.5* 27.3*  MCV 91.3 90.7  PLT 128* 134*   BNP (last 3 results)  Recent Labs  09/11/15 1726 10/03/15 1259 11/22/15 1308  BNP 1228.8* 662.7* 1370.0*    CBG:  Recent Labs Lab 11/28/15 1642 11/29/15 0541 11/29/15 1211 11/29/15 1629 11/29/15 2115  GLUCAP 117* 102* 90 115* 98    No results found for this or any previous visit (from the past 240 hour(s)).    Studies/Results: No results found.  Medications:  Scheduled: . acetaZOLAMIDE  500 mg Oral Q12H  . allopurinol  100 mg Oral Daily  . antiseptic oral rinse  7 mL Mouth Rinse BID  . carvedilol  12.5 mg Oral BID WC  .  ferrous sulfate  325 mg Oral Q breakfast  . metolazone  2.5 mg Oral BID  . potassium chloride SA  40 mEq Oral BID  . rivaroxaban  15 mg Oral Q1200  . sodium chloride  3 mL Intravenous Q12H  . verapamil  180 mg Oral Daily   Continuous: . furosemide (LASIX) infusion 20 mg/hr (12/01/15 1449)   NWG:NFAOZHPRN:sodium chloride, acetaminophen, ondansetron (ZOFRAN) IV, sodium chloride  Assessment/Plan:  Principal Problem:   Acute respiratory failure (HCC) Active Problems:   S/P placement of cardiac pacemaker   HTN (hypertension)   Coronary artery disease, non-occlusive by cath   CHF exacerbation (HCC)   Acute on chronic diastolic heart failure (HCC)   CKD (chronic kidney disease) stage 4, GFR 15-29 ml/min (HCC)   Cardiac pacemaker in situ   Abdominal pain   Pancreatic mass   PAF  (paroxysmal atrial fibrillation) (HCC)    Acute respiratory failure (HCC) due to Acute on chronic diastolic heart failure [facility](HCC): Patient being followed by heart failure team. Patient is on a Lasix infusion along with metolazone and acetazolamide. She appears to be diuresing. Weight is decreasing. Management per cardiology. Strict ins and outs and daily weights.  Pancreatic mass CT abd and pelvis showed a mass evolving the pancreatic head and neck. 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 Cannot perform MRI due to pacemaker. Patient has not currently stable to undergo any further workup in this hospital stay. She will need to be seen by GI as an outpatient to consider EUS.   S/P placement of cardiac pacemaker Stable  Essential HTN (hypertension) Continue Coreg and verapamil. Blood pressures remain stable with diuresis.  Coronary artery disease, non-occlusive by cath Continue ASA. She's asymptomatic.  CKD (chronic kidney disease) stage 4, GFR 15-29 ml/min (HCC) Continue to monitor kidney function. On admission renal function was 2.4. Back in August 2016 her creatinine was ranging around 1.3-1.5. Continue to monitor creatinine.  Gout Continue on allopurinol.  Iron deficiency anemia: Continue iron sulfate.  DVT Prophylaxis: On xarelto    Code Status: DO NOT RESUSCITATE  Family Communication: Discussed with the patient. No family at bedside  Disposition Plan: Continue current management.    LOS: 9 days   Uva CuLPeper HospitalKRISHNAN,Aiyannah Fayad  [facility] Pager [phone] 12/01/2015, 3:44 PM  If 7PM-7AM, please contact night-coverage at [email or web address], password [email or web address]

## 2015-12-01 NOTE — Progress Notes (Signed)
Advanced Heart Failure Rounding Note  Primary HF: Dr. Shirlee Latch    Subjective:    Continues to diurese well on Acetazolamide, lasix drip, and metolazone. Creatinine stable  Denies SOB. Her sister passed and needs to go home by Wednesday for funeral    Objective:   Weight Range: 89.449 kg (197 lb 3.2 oz) Body mass index is 33.83 kg/(m^2).   Vital Signs:   Temp:  [97.8 F (36.6 C)-98.7 F (37.1 C)] 97.8 F (36.6 C) (12/24 0640) Pulse Rate:  [60-65] 60 (12/24 0640) Resp:  [18] 18 (12/24 0640) BP: (95-114)/(41-59) 114/47 mmHg (12/24 1010) SpO2:  [98 %-99 %] 98 % (12/24 0640) Weight:  [89.449 kg (197 lb 3.2 oz)] 89.449 kg (197 lb 3.2 oz) (12/24 0640) Last BM Date: 11/29/15  Weight change: Filed Weights   11/29/15 0503 11/30/15 0546 12/01/15 0640  Weight: 95.709 kg (211 lb) 91.763 kg (202 lb 4.8 oz) 89.449 kg (197 lb 3.2 oz)    Intake/Output:   Intake/Output Summary (Last 24 hours) at 12/01/15 1215 Last data filed at 12/01/15 1100  Gross per 24 hour  Intake 997.33 ml  Output   4750 ml  Net -3752.67 ml     Physical Exam: General: Chronically ill and elderly appearing. NAD. In chair HEENT: normal except for poor dentition, 02 3 lpm via [place]. Neck: supple. JVP  10-12. Carotids 2+ bilat; no bruits. No thyromegaly or nodule noted.  Cor: PMI nondisplaced. RRR. 2/6 TR, widely split S2.  Lungs: cleardiminished breath sounds Abdomen: Obese soft, non-tender, + distention, no HSM. No bruits or masses. Bowel sounds presents x 4.  Extremities: no cyanosis, clubbing, rash, 1-2+  edema to knees bilaterally.  Neuro: A&Ox3, cranial nerves grossly intact. moves all 4 extremities w/o difficulty. Affect pleasant  Telemetry: AV paced in 60s  Labs: CBC  Recent Labs  12/01/15 0351  WBC 5.8  HGB 8.7*  HCT 27.3*  MCV 90.7  PLT 134*   Basic Metabolic Panel  Recent Labs  11/30/15 0506 12/01/15 0351  NA 134* 134*  K 3.4* 3.3*  CL 86* 85*  CO2 39* 36*  GLUCOSE 89 93    BUN 54* 61*  CREATININE 2.28* 2.30*  CALCIUM 8.8* 8.7*   Liver Function Tests  Recent Labs  12/01/15 1130  AST 26  ALT 17  ALKPHOS 100  BILITOT 0.7  PROT 5.6*  ALBUMIN 2.8*   No results for input(s): LIPASE, AMYLASE in the last 72 hours. Cardiac Enzymes No results for input(s): CKTOTAL, CKMB, CKMBINDEX, TROPONINI in the last 72 hours.  BNP: BNP (last 3 results)  Recent Labs  09/11/15 1726 10/03/15 1259 11/22/15 1308  BNP 1228.8* 662.7* 1370.0*    ProBNP (last 3 results) No results for input(s): PROBNP in the last 8760 hours.   D-Dimer No results for input(s): DDIMER in the last 72 hours. Hemoglobin A1C No results for input(s): HGBA1C in the last 72 hours. Fasting Lipid Panel No results for input(s): CHOL, HDL, LDLCALC, TRIG, CHOLHDL, LDLDIRECT in the last 72 hours. Thyroid Function Tests No results for input(s): TSH, T4TOTAL, T3FREE, THYROIDAB in the last 72 hours.  Invalid input(s): FREET3  Other results:     Imaging/Studies:  No results found.  Latest Echo  Latest Cath   Medications:     Scheduled Medications: . acetaZOLAMIDE  500 mg Oral Q12H  . allopurinol  100 mg Oral Daily  . antiseptic oral rinse  7 mL Mouth Rinse BID  . carvedilol  12.5 mg  Oral BID WC  . ferrous sulfate  325 mg Oral Q breakfast  . metolazone  2.5 mg Oral BID  . potassium chloride SA  40 mEq Oral BID  . rivaroxaban  15 mg Oral Q1200  . sodium chloride  3 mL Intravenous Q12H  . verapamil  180 mg Oral Daily    Infusions: . furosemide (LASIX) infusion 20 mg/hr (12/01/15 0141)    PRN Medications: sodium chloride, acetaminophen, ondansetron (ZOFRAN) IV, sodium chloride   Assessment/Plan   Anita Wiley is a 74 y.o. female with history of diastolic CHF, HTN, gout, and bradycardia s/p Medtronic dual chamber pacemaker in 2010 who [facility] with worsening SOB and pedal edema. HF team consulted with A/C diastolic CHF.   1. 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   She developed volume overload in the setting of getting the wrong diuretic and dose at a rehab facility recently.  -Diuresing very well on current regimen. Renal function stable. Will continue. Weight today 197. Recent baseline around 180-185 2. Acute on chronic respiratory failure: Improved on 02 via [place]. 3. CKD stage IV: Creatinine stable. - Monitor closely with diuresis. Currently stable 4. 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  5. 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  - Continue to diurese 6. HTN: BP stable on current meds. 7. PAF: Noted on device interrogation that she has been in and out of afib.  This patient's CHA2DS2-VASc is at least 4. - Continue xarelto 15 mg daily .   8. Large pancreatic mass.  Will defer management at this time to primary team. (I d/w Dr. Radonna RickerFeliz) Consider MRI to further evaluate mostly for prognostic information as she is likely not candidate for any intervention.   Dispo: her sister died recently. Needs to be out by Wednesday for funeral.   Length of Stay: 9  Arvilla MeresBensimhon, Christen Wardrop MD  12/01/2015, 12:15 PM  Advanced Heart Failure Team Pager [phone] (M-F; 7a - 4p)  Please contact [facility] for night-coverage after hours (4p -7a ) and weekends on [email or web address]

## 2015-12-01 NOTE — Progress Notes (Signed)
CARDIAC REHAB PHASE I   PRE:  Rate/Rhythm: 62  BP:   Sitting: 109/41     SaO2: 97% RA  MODE:  Ambulation: 110 ft   POST:  Rate/Rhythm: 68  BP:   Sitting: 114/47     SaO2: 97% RA [phone] Pt requires moderate assist with sitting up in bed and with standing.Once in standing pt is able to ambulate.Pt ambulated 13810ft with walker, IV pole,Foley catheter and 1 person assist. Pt handled walking very well. Pt increased distance by 10 feet with two quick 20 second standing rest breaks. SpO2 remained over 90%. Pt returned to room, sitting in recliner with VSS. Call bell, remote control and water within reach.  Kert Shackett D Tenaya Hilyer,MS,ACSM-RCEP 12/01/2015 10:30 AM

## 2015-12-02 LAB — BASIC METABOLIC PANEL
Anion gap: 12 (ref 5–15)
BUN: 62 mg/dL — AB (ref 6–20)
CHLORIDE: 82 mmol/L — AB (ref 101–111)
CO2: 40 mmol/L — AB (ref 22–32)
Calcium: 8.8 mg/dL — ABNORMAL LOW (ref 8.9–10.3)
Creatinine, Ser: 2.22 mg/dL — ABNORMAL HIGH (ref 0.44–1.00)
GFR calc non Af Amer: 20 mL/min — ABNORMAL LOW (ref >60)
GFR, EST AFRICAN AMERICAN: 24 mL/min — AB (ref >60)
Glucose, Bld: 94 mg/dL (ref 65–99)
POTASSIUM: 3.5 mmol/L (ref 3.5–5.1)
SODIUM: 134 mmol/L — AB (ref 135–145)

## 2015-12-02 LAB — GLUCOSE, CAPILLARY
GLUCOSE-CAPILLARY: 97 mg/dL (ref 65–99)
Glucose-Capillary: 104 mg/dL — ABNORMAL HIGH (ref 65–99)
Glucose-Capillary: 124 mg/dL — ABNORMAL HIGH (ref 65–99)

## 2015-12-02 NOTE — Progress Notes (Signed)
Advanced Heart Failure Rounding Note  Primary HF: Dr. Shirlee LatchMcLean    Subjective:    Continues to diurese well on Acetazolamide, lasix drip, and metolazone. Creatinine stable. Weight now 195.   Denies SOB. Her sister passed and needs to go home by Wednesday for funeral    Objective:   Weight Range: 88.724 kg (195 lb 9.6 oz) Body mass index is 33.56 kg/(m^2).   Vital Signs:   Temp:  [98.4 F (36.9 C)-98.8 F (37.1 C)] 98.8 F (37.1 C) (12/25 0547) Pulse Rate:  [59-60] 59 (12/25 0547) Resp:  [16-18] 16 (12/25 0547) BP: (115-118)/(42-48) 115/42 mmHg (12/25 0547) SpO2:  [97 %-99 %] 97 % (12/25 0547) Weight:  [88.724 kg (195 lb 9.6 oz)] 88.724 kg (195 lb 9.6 oz) (12/25 0547) Last BM Date: 11/29/15  Weight change: Filed Weights   11/30/15 0546 12/01/15 0640 12/02/15 0547  Weight: 91.763 kg (202 lb 4.8 oz) 89.449 kg (197 lb 3.2 oz) 88.724 kg (195 lb 9.6 oz)    Intake/Output:   Intake/Output Summary (Last 24 hours) at 12/02/15 1222 Last data filed at 12/02/15 1120  Gross per 24 hour  Intake 1574.33 ml  Output   5025 ml  Net -3450.67 ml     Physical Exam: General: Chronically ill and elderly appearing. NAD. Inbed about to get a bath HEENT: normal except for poor dentition, 02 3 lpm via [place]. Neck: supple. JVP  8-9 Carotids 2+ bilat; no bruits. No thyromegaly or nodule noted.  Cor: PMI nondisplaced. RRR. 2/6 TR, widely split S2.  Lungs: cleardiminished breath sounds Abdomen: Obese soft, non-tender, + mild distention, no HSM. No bruits or masses. Bowel sounds presents x 4.  Extremities: no cyanosis, clubbing, rash, 1+  edema to knees bilaterally.  Neuro: A&Ox3, cranial nerves grossly intact. moves all 4 extremities w/o difficulty. Affect pleasant  Telemetry: AV paced in 60s  Labs: CBC  Recent Labs  12/01/15 0351  WBC 5.8  HGB 8.7*  HCT 27.3*  MCV 90.7  PLT 134*   Basic Metabolic Panel  Recent Labs  12/01/15 0351 12/02/15 0305  NA 134* 134*  K 3.3* 3.5    CL 85* 82*  CO2 36* 40*  GLUCOSE 93 94  BUN 61* 62*  CREATININE 2.30* 2.22*  CALCIUM 8.7* 8.8*   Liver Function Tests  Recent Labs  12/01/15 1130  AST 26  ALT 17  ALKPHOS 100  BILITOT 0.7  PROT 5.6*  ALBUMIN 2.8*   No results for input(s): LIPASE, AMYLASE in the last 72 hours. Cardiac Enzymes No results for input(s): CKTOTAL, CKMB, CKMBINDEX, TROPONINI in the last 72 hours.  BNP: BNP (last 3 results)  Recent Labs  09/11/15 1726 10/03/15 1259 11/22/15 1308  BNP 1228.8* 662.7* 1370.0*    ProBNP (last 3 results) No results for input(s): PROBNP in the last 8760 hours.   D-Dimer No results for input(s): DDIMER in the last 72 hours. Hemoglobin A1C No results for input(s): HGBA1C in the last 72 hours. Fasting Lipid Panel No results for input(s): CHOL, HDL, LDLCALC, TRIG, CHOLHDL, LDLDIRECT in the last 72 hours. Thyroid Function Tests No results for input(s): TSH, T4TOTAL, T3FREE, THYROIDAB in the last 72 hours.  Invalid input(s): FREET3  Other results:     Imaging/Studies:  No results found.  Latest Echo  Latest Cath   Medications:     Scheduled Medications: . acetaZOLAMIDE  500 mg Oral Q12H  . allopurinol  100 mg Oral Daily  . antiseptic oral rinse  7 mL Mouth Rinse BID  . carvedilol  12.5 mg Oral BID WC  . ferrous sulfate  325 mg Oral Q breakfast  . metolazone  2.5 mg Oral BID  . potassium chloride SA  40 mEq Oral BID  . rivaroxaban  15 mg Oral Q1200  . sodium chloride  3 mL Intravenous Q12H  . verapamil  180 mg Oral Daily    Infusions: . furosemide (LASIX) infusion 20 mg/hr (12/02/15 0143)    PRN Medications: sodium chloride, acetaminophen, ondansetron (ZOFRAN) IV, sodium chloride   Assessment/Plan   Anita Wiley is a 74 y.o. female with history of diastolic CHF, HTN, gout, and bradycardia s/p Medtronic dual chamber pacemaker in 2010 who presented to [facility] with worsening SOB and pedal edema. HF team consulted with A/C diastolic CHF.    1. 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   She developed volume overload in the setting of getting the wrong diuretic and dose at a rehab facility recently.  -Diuresing very well on current regimen. Renal function stable. Will continue. Weight today 195. Recent baseline around 180-185 2. Acute on chronic respiratory failure: Improved on 02 via [place]. 3. CKD stage IV: Creatinine stable. - Monitor closely with diuresis. Currently stable 4. 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  5. 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  - Continue to diurese 6. HTN: BP stable on current meds. 7. PAF: Noted on device interrogation that she has been in and out of afib.  This patient's CHA2DS2-VASc is at least 4. - Continue xarelto 15 mg daily .   8. Large pancreatic mass.  Will defer management at this time to primary team. (I d/w Dr. Radonna Ricker) Consider MRI to further evaluate mostly for prognostic information as she is likely not candidate for any intervention.   Dispo: her sister died recently. Needs to be out by Wednesday for funeral. Will need SW to help coordinate. Ideally should would go back to SNF on d/c.    Length of Stay: 10  Arvilla Meres MD  12/02/2015, 12:22 PM  Advanced Heart Failure Team Pager [phone] (M-F; 7a - 4p)  Please contact [facility] for night-coverage after hours (4p -7a ) and weekends on [email or web address]

## 2015-12-02 NOTE — Progress Notes (Signed)
[facility] PROGRESS NOTE  Anita HaagensenVenna Wiley ZOX:[id number] DOB: 12/07/1941 DOA: 11/22/2015  PCP: Angela AdamAHMED,SYED A, MD  Brief HPI: 74 year old female with a past medical history of chronic diastolic congestive heart failure, chronic kidney disease stage IV with a pacemaker, presented with shortness of breath and was hospitalized for further management of diastolic CHF.  Past medical history:  Past Medical History  Diagnosis Date  . Hypertension   . Bradycardia     s/p pacemaker in 2010  . Chronic kidney disease   . Gout   . Pacemaker 2010    MEDTRONIC VERSA PACEMAKER...NOT MRI SAFE  . Arthritis     OSTEO  . Gout   . CHF (congestive heart failure) [facility](HCC)     Consultants: Cardiology  Procedures: None  Antibiotics: None  Subjective: Patient feels well. Denies any chest pain. Breathing is improving. She reported that her stepsister passed away recently. [place]asmo ScoreFuneral is on Wednesday.   Objective: Vital Signs  Filed Vitals:   12/01/15 1200 12/01/15 2007 12/01/15 2124 12/02/15 0547  BP: 115/55 118/48  115/42  Pulse: 61 59 60 59  Temp: 98.4 F (36.9 C) 98.4 F (36.9 C)  98.8 F (37.1 C)  TempSrc: Oral Oral  Oral  Resp: 18 18  16   Height:      Weight:    88.724 kg (195 lb 9.6 oz)  SpO2: 100% 99%  97%    Intake/Output Summary (Last 24 hours) at 12/02/15 0853 Last data filed at 12/02/15 0700  Gross per 24 hour  Intake 1214.33 ml  Output   4725 ml  Net -3510.67 ml   Filed Weights   11/30/15 0546 12/01/15 0640 12/02/15 0547  Weight: 91.763 kg (202 lb 4.8 oz) 89.449 kg (197 lb 3.2 oz) 88.724 kg (195 lb 9.6 oz)    General appearance: alert, cooperative, appears stated age and no distress Resp: Improved and entry bilaterally. Crackles bilateral bases. No wheezing, rhonchi. Cardio: regular rate and rhythm, S1, S2 normal, no murmur, click, rub or gallop GI: soft, non-tender; bowel sounds normal; no masses,  no organomegaly Extremities: Edema is present Neurologic: No focal  deficits.  Lab Results:  Basic Metabolic Panel:  Recent Labs Lab 11/28/15 1034 11/29/15 0420 11/30/15 0506 12/01/15 0351 12/02/15 0305  NA 141 135 134* 134* 134*  K 3.9 3.5 3.4* 3.3* 3.5  CL 96* 88* 86* 85* 82*  CO2 36* 35* 39* 36* 40*  GLUCOSE 98 110* 89 93 94  BUN 52* 51* 54* 61* 62*  CREATININE 2.17* 2.22* 2.28* 2.30* 2.22*  CALCIUM 9.0 9.1 8.8* 8.7* 8.8*   Liver Function Tests:  Recent Labs Lab 12/01/15 1130  AST 26  ALT 17  ALKPHOS 100  BILITOT 0.7  PROT 5.6*  ALBUMIN 2.8*   CBC:  Recent Labs Lab 11/27/15 0535 12/01/15 0351  WBC 5.9 5.8  HGB 9.2* 8.7*  HCT 29.5* 27.3*  MCV 91.3 90.7  PLT 128* 134*   BNP (last 3 results)  Recent Labs  09/11/15 1726 10/03/15 1259 11/22/15 1308  BNP 1228.8* 662.7* 1370.0*    CBG:  Recent Labs Lab 11/29/15 1211 11/29/15 1629 11/29/15 2115 12/01/15 2103 12/02/15 0550  GLUCAP 90 115* 98 122* 97    No results found for this or any previous visit (from the past 240 hour(s)).    Studies/Results: No results found.  Medications:  Scheduled: . acetaZOLAMIDE  500 mg Oral Q12H  . allopurinol  100 mg Oral Daily  . antiseptic oral rinse  7 mL Mouth  Rinse BID  . carvedilol  12.5 mg Oral BID WC  . ferrous sulfate  325 mg Oral Q breakfast  . metolazone  2.5 mg Oral BID  . potassium chloride SA  40 mEq Oral BID  . rivaroxaban  15 mg Oral Q1200  . sodium chloride  3 mL Intravenous Q12H  . verapamil  180 mg Oral Daily   Continuous: . furosemide (LASIX) infusion 20 mg/hr (12/02/15 0143)   ONG:EXBMWU chloride, acetaminophen, ondansetron (ZOFRAN) IV, sodium chloride  Assessment/Plan:  Principal Problem:   Acute respiratory failure (HCC) Active Problems:   S/P placement of cardiac pacemaker   HTN (hypertension)   Coronary artery disease, non-occlusive by cath   CHF exacerbation (HCC)   Acute on chronic diastolic heart failure (HCC)   CKD (chronic kidney disease) stage 4, GFR 15-29 ml/min (HCC)    Cardiac pacemaker in situ   Abdominal pain   Pancreatic mass   PAF (paroxysmal atrial fibrillation) (HCC)    Acute respiratory failure (HCC) due to Acute on chronic diastolic heart failure [facility]): Patient being followed by heart failure team. Patient is on a Lasix infusion along with metolazone and acetazolamide. She appears to be diuresing. Weight is decreasing. Management per cardiology. Strict ins and outs and daily weights.  Pancreatic mass CT abd and pelvis showed a mass evolving the pancreatic head and neck. 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 Cannot perform MRI due to pacemaker. Patient is not currently stable to undergo any further workup in this hospital stay. She will need to be seen by GI as an outpatient to consider EUS.   S/P placement of cardiac pacemaker Stable  Essential HTN (hypertension) Continue Coreg and verapamil. Blood pressures remain stable with diuresis.  Coronary artery disease, non-occlusive by cath Continue ASA. She's asymptomatic.  CKD (chronic kidney disease) stage 4, GFR 15-29 ml/min (HCC) Creatinine is slightly better this morning. On admission renal function was 2.4. Back in August 2016 her creatinine was ranging around 1.3-1.5. Continue to monitor creatinine.  Gout Continue on allopurinol.  Iron deficiency anemia: Continue iron sulfate.  DVT Prophylaxis: On xarelto    Code Status: DO NOT RESUSCITATE  Family Communication: Discussed with the patient. No family at bedside  Disposition Plan: Continue current management. Discharge when cleared by cardiology. Unfortunately, patient's stepsister passed away recently and her funeral is on Wednesday. Patient requesting to be present for this. CSW to facilitate if possible , especially as patient will need to be discharged to a skilled nursing facility.    LOS: 10 days   [facility]  [facility] Pager [phone] 12/02/2015, 8:53 AM  If 7PM-7AM, please contact night-coverage at [email or web address],  password [facility]

## 2015-12-03 ENCOUNTER — Inpatient Hospital Stay (HOSPITAL_COMMUNITY): Payer: Medicare Other

## 2015-12-03 DIAGNOSIS — M2550 Pain in unspecified joint: Secondary | ICD-10-CM

## 2015-12-03 DIAGNOSIS — R29898 Other symptoms and signs involving the musculoskeletal system: Secondary | ICD-10-CM | POA: Insufficient documentation

## 2015-12-03 LAB — CBC
HCT: 31 % — ABNORMAL LOW (ref 36.0–46.0)
HEMOGLOBIN: 10 g/dL — AB (ref 12.0–15.0)
MCH: 28.9 pg (ref 26.0–34.0)
MCHC: 32.3 g/dL (ref 30.0–36.0)
MCV: 89.6 fL (ref 78.0–100.0)
Platelets: 183 10*3/uL (ref 150–400)
RBC: 3.46 MIL/uL — AB (ref 3.87–5.11)
RDW: 15.9 % — ABNORMAL HIGH (ref 11.5–15.5)
WBC: 9.1 10*3/uL (ref 4.0–10.5)

## 2015-12-03 LAB — BASIC METABOLIC PANEL
Anion gap: 12 (ref 5–15)
BUN: 68 mg/dL — ABNORMAL HIGH (ref 6–20)
CHLORIDE: 81 mmol/L — AB (ref 101–111)
CO2: 43 mmol/L — ABNORMAL HIGH (ref 22–32)
CREATININE: 2.39 mg/dL — AB (ref 0.44–1.00)
Calcium: 9.1 mg/dL (ref 8.9–10.3)
GFR calc non Af Amer: 19 mL/min — ABNORMAL LOW (ref >60)
GFR, EST AFRICAN AMERICAN: 22 mL/min — AB (ref >60)
Glucose, Bld: 101 mg/dL — ABNORMAL HIGH (ref 65–99)
Potassium: 2.9 mmol/L — ABNORMAL LOW (ref 3.5–5.1)
SODIUM: 136 mmol/L (ref 135–145)

## 2015-12-03 LAB — GLUCOSE, CAPILLARY: GLUCOSE-CAPILLARY: 108 mg/dL — AB (ref 65–99)

## 2015-12-03 MED ORDER — PREDNISONE 20 MG PO TABS
20.0000 mg | ORAL_TABLET | Freq: Two times a day (BID) | ORAL | Status: DC
  Administered 2015-12-03: 20 mg via ORAL
  Filled 2015-12-03: qty 1

## 2015-12-03 MED ORDER — POTASSIUM CHLORIDE CRYS ER 20 MEQ PO TBCR
40.0000 meq | EXTENDED_RELEASE_TABLET | Freq: Once | ORAL | Status: AC
  Administered 2015-12-03: 40 meq via ORAL
  Filled 2015-12-03: qty 2

## 2015-12-03 MED ORDER — PREDNISONE 20 MG PO TABS
20.0000 mg | ORAL_TABLET | Freq: Two times a day (BID) | ORAL | Status: DC
  Administered 2015-12-04 – 2015-12-05 (×4): 20 mg via ORAL
  Filled 2015-12-03 (×4): qty 1

## 2015-12-03 MED ORDER — TORSEMIDE 20 MG PO TABS
40.0000 mg | ORAL_TABLET | Freq: Two times a day (BID) | ORAL | Status: DC
  Administered 2015-12-03: 40 mg via ORAL
  Filled 2015-12-03 (×2): qty 2

## 2015-12-03 NOTE — NC FL2 (Signed)
[place] MEDICAID FL2 LEVEL OF CARE SCREENING TOOL     IDENTIFICATION  Patient Name: Anita Wiley Birthdate: 12/25/1940 Sex: female Admission Date (Current Location): 11/22/2015  [facility] [place]   Facility and Address:  [facility] [place], [address], [place], [place] [address]      Provider Number: [id number]  Attending Physician Name and Address:  Osvaldo ShipperGokul Krishnan, MD  Relative Name and Phone Number:       Current Level of Care: Hospital Recommended Level of Care: Skilled Nursing Facility Prior Approval Number:    Date Approved/Denied:   PASRR Number:    Discharge Plan: SNF    Current Diagnoses: Patient Active Problem List   Diagnosis Date Noted  . Right arm weakness   . PAF (paroxysmal atrial fibrillation) (HCC)   . Pancreatic mass   . Abdominal pain   . Cardiac pacemaker in situ   . CKD (chronic kidney disease) stage 4, GFR 15-29 ml/min (HCC) 11/23/2015  . Acute respiratory failure (HCC) 11/22/2015  . Acute on chronic diastolic heart failure (HCC) 11/22/2015  . Chronic diastolic CHF (congestive heart failure) (HCC) 10/03/2015  . CKD (chronic kidney disease), stage III 10/03/2015  . Acute on chronic congestive heart failure (HCC)   . Peripheral edema   . SOB (shortness of breath)   . CAP (community acquired pneumonia) 08/03/2015  . Acute respiratory distress (HCC) 08/03/2015  . Acute on chronic systolic congestive heart failure (HCC) 08/03/2015  . CHF exacerbation (HCC) 08/03/2015  . Gout   . NSTEMI (non-ST elevated myocardial infarction) (HCC) 11/06/2014  . Coronary artery disease, non-occlusive by cath 11/06/2014  . S/P placement of cardiac pacemaker   . HTN (hypertension)     Orientation RESPIRATION BLADDER Height & Weight    Self, Situation, Time, Place  Normal Continent 5\' 4"  (162.6 cm) 187 lbs.  BEHAVIORAL SYMPTOMS/MOOD NEUROLOGICAL BOWEL NUTRITION STATUS      Continent Diet (Heart Healthy -  No beef)  AMBULATORY STATUS COMMUNICATION OF NEEDS Skin   Extensive Assist Verbally Other (Comment) (Rash-- lower mid abdomen)                       Personal Care Assistance Level of Assistance  Dressing, Bathing, Feeding Bathing Assistance: Limited assistance Feeding assistance: Independent Dressing Assistance: Limited assistance     Functional Limitations Info  Speech, Sight, Hearing Sight Info: Adequate Hearing Info: Adequate Speech Info: Adequate    SPECIAL CARE FACTORS FREQUENCY  PT (By licensed PT), OT (By licensed OT)     PT Frequency: 5 OT Frequency: 5            Contractures Contractures Info: Not present    Additional Factors Info  Code Status, Allergies Code Status Info: DNR Allergies Info: Codeine           Current Medications (12/03/2015):  This is the current hospital active medication list Current Facility-Administered Medications  Medication Dose Route Frequency Provider Last Rate Last Dose  . 0.9 %  sodium chloride infusion  250 mL Intravenous PRN Gwenyth BenderKaren M Black, NP      . acetaminophen (TYLENOL) tablet 650 mg  650 mg Oral Q4H PRN Gwenyth BenderKaren M Black, NP      . allopurinol (ZYLOPRIM) tablet 100 mg  100 mg Oral Daily Lesle ChrisKaren M Black, NP   100 mg at 12/03/15 1029  . antiseptic oral rinse (CPC / CETYLPYRIDINIUM CHLORIDE 0.05%) solution 7 mL  7 mL Mouth Rinse BID  Belkys A Regalado, MD   7 mL at 12/03/15 1000  . carvedilol (COREG) tablet 12.5 mg  12.5 mg Oral BID WC Calvert Cantor, MD   12.5 mg at 12/03/15 1641  . ferrous sulfate tablet 325 mg  325 mg Oral Q breakfast Calvert Cantor, MD   325 mg at 12/03/15 0827  . ondansetron (ZOFRAN) injection 4 mg  4 mg Intravenous Q6H PRN Gwenyth Bender, NP   4 mg at 11/26/15 1833  . potassium chloride SA (K-DUR,KLOR-CON) CR tablet 40 mEq  40 mEq Oral BID Gwenyth Bender, NP   40 mEq at 12/03/15 1030  . predniSONE (DELTASONE) tablet 20 mg  20 mg Oral BID WC Osvaldo Shipper, MD   20 mg at 12/03/15 1641  . Rivaroxaban (XARELTO)  tablet 15 mg  15 mg Oral Q1200 Marinda Elk, MD   15 mg at 12/03/15 1445  . sodium chloride 0.9 % injection 3 mL  3 mL Intravenous Q12H Lesle Chris Black, NP   3 mL at 12/01/15 1012  . sodium chloride 0.9 % injection 3 mL  3 mL Intravenous PRN Gwenyth Bender, NP      . torsemide [facility]) tablet 40 mg  40 mg Oral BID Dolores Patty, MD   40 mg at 12/03/15 1812  . verapamil (CALAN-SR) CR tablet 180 mg  180 mg Oral Daily Gwenyth Bender, NP   180 mg at 12/03/15 1055     Discharge Medications: Please see discharge summary for a list of discharge medications.  Relevant Imaging Results:  Relevant Lab Results:   Additional Information SSN:  [id number]  Anita Wiley, Lorri Frederick, LCSW

## 2015-12-03 NOTE — Consult Note (Signed)
Admission H&P    Chief Complaint: Weakness of right upper extremity.  HPI: Anita Wiley is an 74 y.o. female with a history of heart failure, hypertension, chronic kidney disease, bradycardia and pacemaker placement, and arthritis, admitted for management of exacerbation of her failure. Neurology consultation was requested for evaluation regarding reduced use of right upper extremity, of new onset. No change in speech as been noticed. Family members don't think that her face looks different from usual. She's also complaining of pain involving right upper extremity, particularly her right wrist, and to lesser extent pain involving right lower extremity. Patient has been having difficulty with use of her right upper extremity as well as with walking. She has had difficulty with using her rolling walker due to pain in her right upper extremity. She had a CT scan of the head earlier today which showed no acute intracranial abnormality. She's been taking aspirin 81 mg per day.  Past Medical History  Diagnosis Date  . Hypertension   . Bradycardia     s/p pacemaker in 2010  . Chronic kidney disease   . Gout   . Pacemaker 2010    MEDTRONIC VERSA PACEMAKER...NOT MRI SAFE  . Arthritis     OSTEO  . Gout   . CHF (congestive heart failure) [facility])     Past Surgical History  Procedure Laterality Date  . Pacemaker insertion      2010  . Knee surgery    . Abdominal hysterectomy      at age 65  . Insert / replace / remove pacemaker  2010    MEDTRONIC VERSA PACEMAKER....Marland KitchenNOT MRI SAFE  . Left heart catheterization with coronary angiogram N/A 11/06/2014    Procedure: LEFT HEART CATHETERIZATION WITH CORONARY ANGIOGRAM;  Surgeon: Troy Sine, MD;  Location: [facility] CATH LAB;  Service: Cardiovascular;  Laterality: N/A;  . Cardiac catheterization N/A 09/19/2015    Procedure: Right Heart Cath;  Surgeon: Larey Dresser, MD;  Location: [place] CV LAB;  Service: Cardiovascular;  Laterality: N/A;    Family  History  Problem Relation Age of Onset  . Hypertension Mother   . CAD Sister     unkown age   Social History:  reports that she has never smoked. She has never used smokeless tobacco. She reports that she does not drink alcohol or use illicit drugs.  Allergies:  Allergies  Allergen Reactions  . Codeine Itching    Medications Prior to Admission  Medication Sig Dispense Refill  . allopurinol (ZYLOPRIM) 100 MG tablet Take 100 mg by mouth daily.    Marland Kitchen aspirin 81 MG tablet Take 81 mg by mouth daily.    . carvedilol (COREG) 12.5 MG tablet Take 1 tablet (12.5 mg total) by mouth 2 (two) times daily with a meal. 60 tablet 0  . ferrous sulfate 325 (65 FE) MG tablet Take 325 mg by mouth daily with breakfast.    . Potassium Chloride ER 20 MEQ TBCR Take 40 mEq by mouth 2 (two) times daily. 120 tablet 3  . torsemide (DEMADEX) 20 MG tablet Take 2 tablets (40 mg total) by mouth 2 (two) times daily. Take two 20 mg tablets twice a day (Patient taking differently: Take 2-40 mg by mouth See admin instructions. 27m in am, 283min pm) 120 tablet 11  . verapamil (CALAN-SR) 180 MG CR tablet Take 180 mg by mouth daily.  5    ROS: History obtained from patient and patient's brother.  General ROS: negative for - chills,  fatigue, fever, night sweats, weight gain or weight loss Psychological ROS: negative for - behavioral disorder, hallucinations, memory difficulties, mood swings or suicidal ideation Ophthalmic ROS: negative for - blurry vision, double vision, eye pain or loss of vision ENT ROS: negative for - epistaxis, nasal discharge, oral lesions, sore throat, tinnitus or vertigo Allergy and Immunology ROS: negative for - hives or itchy/watery eyes Hematological and Lymphatic ROS: negative for - bleeding problems, bruising or swollen lymph nodes Endocrine ROS: negative for - galactorrhea, hair pattern changes, polydipsia/polyuria or temperature intolerance Respiratory ROS: Increasing shortness of breath  prior to admission Cardiovascular ROS: As noted in present illness Gastrointestinal ROS: negative for - abdominal pain, diarrhea, hematemesis, nausea/vomiting or stool incontinence Genito-Urinary ROS: negative for - dysuria, hematuria, incontinence or urinary frequency/urgency Musculoskeletal ROS: History of arthritis with joint pains him including gout involving multiple joints Neurological ROS: as noted in HPI Dermatological ROS: negative for rash and skin lesion changes  Physical Examination: Blood pressure 108/42, pulse 54, temperature 99.2 F (37.3 C), temperature source Oral, resp. rate 18, height 5' 4"  (1.626 m), weight 85.1 kg (187 lb 9.8 oz), SpO2 100 %.  HEENT-  Normocephalic, no lesions, without obvious abnormality.  Normal external eye and conjunctiva.  Normal TM's bilaterally.  Normal auditory canals and external ears. Normal external nose, mucus membranes and septum.  Normal pharynx. Neck supple with no masses, nodes, nodules or enlargement. Cardiovascular - regular rate and rhythm and systolic murmur: early systolic 2/6, crescendo throughout the precordium Lungs - chest clear, no wheezing, rales, normal symmetric air entry Abdomen - soft, non-tender; bowel sounds normal; no masses,  no organomegaly Extremities - 1+ edema distally and lower extremities  Neurologic Examination: Mental Status: Alert, oriented, thought content appropriate.  Speech fluent without evidence of aphasia. Able to follow commands without difficulty. Cranial Nerves: II-Visual fields were normal. III/IV/VI-Pupils were equal and reacted. Extraocular movements were full and conjugate.    V/VII-no facial numbness and no facial weakness. VIII-normal. X-mild dysarthria consistent with being edentulous. XI: trapezius strength/neck flexion strength normal bilaterally XII-midline tongue extension with normal strength. Motor: Reduced range of motion of right upper extremity, particularly distally due to severe  pain with movement at the wrist. Good range of motion at the shoulder without significant pain. Normal strength proximally of right upper extremity. Strength of right upper extremity difficult to assess because of pain and guarding. Normal strength of left upper extremity proximally and distally. Equal movement and strength of lower extremities although significant guarding with testing of lower extremity distally at the ankle. She had no drift of either lower extremity. Sensory: Normal throughout. Deep Tendon Reflexes: Trace to 1+ and symmetric. Plantars: Mute bilaterally Cerebellar: Normal finger-to-nose testing with use of left upper extremity; coordination of right upper extremity difficult to assess due to pain with movement. Carotid auscultation: Normal  Results for orders placed or performed during the hospital encounter of 11/22/15 (from the past 48 hour(s))  Glucose, capillary     Status: Abnormal   Collection Time: 12/01/15  9:03 PM  Result Value Ref Range   Glucose-Capillary 122 (H) 65 - 99 mg/dL  Basic metabolic panel     Status: Abnormal   Collection Time: 12/02/15  3:05 AM  Result Value Ref Range   Sodium 134 (L) 135 - 145 mmol/L   Potassium 3.5 3.5 - 5.1 mmol/L   Chloride 82 (L) 101 - 111 mmol/L   CO2 40 (H) 22 - 32 mmol/L   Glucose, Bld 94 65 - 99  mg/dL   BUN 62 (H) 6 - 20 mg/dL   Creatinine, Ser 2.22 (H) 0.44 - 1.00 mg/dL   Calcium 8.8 (L) 8.9 - 10.3 mg/dL   GFR calc non Af Amer 20 (L) >60 mL/min   GFR calc Af Amer 24 (L) >60 mL/min    Comment: (NOTE) The eGFR has been calculated using the CKD EPI equation. This calculation has not been validated in all clinical situations. eGFR's persistently <60 mL/min signify possible Chronic Kidney Disease.    Anion gap 12 5 - 15  Glucose, capillary     Status: None   Collection Time: 12/02/15  5:50 AM  Result Value Ref Range   Glucose-Capillary 97 65 - 99 mg/dL  Glucose, capillary     Status: Abnormal   Collection Time:  12/02/15 11:31 AM  Result Value Ref Range   Glucose-Capillary 104 (H) 65 - 99 mg/dL  Glucose, capillary     Status: Abnormal   Collection Time: 12/02/15  9:43 PM  Result Value Ref Range   Glucose-Capillary 124 (H) 65 - 99 mg/dL   Comment 1 Notify RN    Comment 2 Document in Chart   CBC     Status: Abnormal   Collection Time: 12/03/15  5:46 AM  Result Value Ref Range   WBC 9.1 4.0 - 10.5 K/uL   RBC 3.46 (L) 3.87 - 5.11 MIL/uL   Hemoglobin 10.0 (L) 12.0 - 15.0 g/dL   HCT 31.0 (L) 36.0 - 46.0 %   MCV 89.6 78.0 - 100.0 fL   MCH 28.9 26.0 - 34.0 pg   MCHC 32.3 30.0 - 36.0 g/dL   RDW 15.9 (H) 11.5 - 15.5 %   Platelets 183 150 - 400 K/uL  Basic metabolic panel     Status: Abnormal   Collection Time: 12/03/15  5:46 AM  Result Value Ref Range   Sodium 136 135 - 145 mmol/L   Potassium 2.9 (L) 3.5 - 5.1 mmol/L   Chloride 81 (L) 101 - 111 mmol/L   CO2 43 (H) 22 - 32 mmol/L   Glucose, Bld 101 (H) 65 - 99 mg/dL   BUN 68 (H) 6 - 20 mg/dL   Creatinine, Ser 2.39 (H) 0.44 - 1.00 mg/dL   Calcium 9.1 8.9 - 10.3 mg/dL   GFR calc non Af Amer 19 (L) >60 mL/min   GFR calc Af Amer 22 (L) >60 mL/min    Comment: (NOTE) The eGFR has been calculated using the CKD EPI equation. This calculation has not been validated in all clinical situations. eGFR's persistently <60 mL/min signify possible Chronic Kidney Disease.    Anion gap 12 5 - 15  Glucose, capillary     Status: Abnormal   Collection Time: 12/03/15  7:01 AM  Result Value Ref Range   Glucose-Capillary 108 (H) 65 - 99 mg/dL   Comment 1 Notify RN    Ct Head Wo Contrast  12/03/2015  CLINICAL DATA:  Generalized weakness. EXAM: CT HEAD WITHOUT CONTRAST TECHNIQUE: Contiguous axial images were obtained from the base of the skull through the vertex without intravenous contrast. COMPARISON:  None. FINDINGS: Brain: No evidence of acute infarction, hemorrhage, extra-axial collection, ventriculomegaly, or mass effect. There is mild brain parenchymal  atrophy and chronic small vessel disease changes. Vascular: No hyperdense vessel or unexpected calcification. Skull: Negative for fracture or focal lesion. Sinuses/Orbits: Mild mucosal thickening of the maxillary and ethmoid sinuses bilaterally. Other: None. IMPRESSION: No acute intracranial abnormality. Atrophy, chronic microvascular disease. Mild chronic pansinusitis.  Electronically Signed   By: Fidela Salisbury M.D.   On: 12/03/2015 14:00    Assessment/Plan 38-year-old lady admitted for her singing of heart failure, with reduced movement and use of right extremities, upper greater than lower. Patient does not appear to have clinical signs of acute stroke. Reduced movement and use of right extremities appears to be in very to pain on movement and guarding. Unfortunately an MRI of her brain cannot be obtained because she has a pacemaker.  Recommendations: 1. Occupational therapy consult for management of movement and use of right upper extremity 2. Continue physical therapy intervention, as well. 3. Repeat CT scan of her head in 2-3 days to rule out acute stroke, although unlikely, since an MRI cannot be obtained 4. No further neurodiagnostic studies are indicated at this point.  C.R. Nicole Kindred, MD [facility] [phone]  12/03/2015, 4:13 PM

## 2015-12-03 NOTE — Progress Notes (Signed)
Patient c/o right-sided weakness to arm and leg.  She reached with left hand for water cup with meds, even though she is right-handed.  Dr. Rito EhrlichKrishnan informed and CT of head ordered.

## 2015-12-03 NOTE — Progress Notes (Signed)
Talked to patient about DCP; she is now agreeable to SNF placement. Hulan Saasonna Soc Worker made aware. Abelino DerrickB Durante Violett Pershing General HospitalRN,MHA,BSN [phone]

## 2015-12-03 NOTE — Progress Notes (Signed)
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  Past medical history:  Past Medical History  Diagnosis Date  . Hypertension   . Bradycardia     s/p pacemaker in 2010  . Chronic kidney disease   . Gout   . Pacemaker 2010    MEDTRONIC VERSA PACEMAKER...NOT MRI SAFE  . Arthritis     OSTEO  . Gout   . CHF (congestive heart failure) [facility])     Consultants: Cardiology  Procedures: None  Antibiotics: None  Subjective: Patient denies any complaints. I was informed by the nurse that when physical therapy was working with the patient. They noticed that the patient was not using her right arm. She was complaining of pain apparently. Patient is a very poor historian due to her hearing impairment. She is unable to tell me what's wrong with her right arm and leg.   Objective: Vital Signs  Filed Vitals:   12/02/15 0547 12/02/15 1500 12/02/15 2032 12/03/15 0637  BP: 115/42 99/45 117/48 120/51  Pulse: 59 60 61 58  Temp: 98.8 F (37.1 C) 98.4 F (36.9 C) 98.7 F (37.1 C) 98.9 F (37.2 C)  TempSrc: Oral Oral Oral Oral  Resp: Height:      Weight: 88.724 kg (195 lb 9.6 oz)   85.1 kg (187 lb 9.8 oz)  SpO2: 97% 100% 100% 95%    Intake/Output Summary (Last 24 hours) at 12/03/15 0847 Last data filed at 12/03/15 8119  Gross per 24 hour  Intake 1779.67 ml  Output   5675 ml  Net -3895.33 ml   Filed Weights   12/01/15 0640 12/02/15 0547 12/03/15 0637  Weight: 89.449 kg (197 lb 3.2 oz) 88.724 kg (195 lb 9.6 oz) 85.1 kg (187 lb 9.8 oz)    General appearance: alert, cooperative, appears stated age and no distress Resp: Improved and entry bilaterally.  Crackles bilateral bases. No wheezing, rhonchi. Cardio: regular rate and rhythm, S1, S2 normal, no murmur, click, rub or gallop GI: soft, non-tender; bowel sounds normal; no masses,  no organomegaly Extremities: Edema is present Neurologic: No facial asymmetry. Tongue is midline. Moving her left arm and leg without any problems. She has limited ability of the right arm and leg. Upon moving the right arm. She winced and complained of pain.  Lab Results:  Basic Metabolic Panel:  Recent Labs Lab 11/29/15 0420 11/30/15 0506 12/01/15 0351 12/02/15 0305 12/03/15 0546  NA 135 134* 134* 134* 136  K 3.5 3.4* 3.3* 3.5 2.9*  CL 88* 86* 85* 82* 81*  CO2 35* 39* 36* 40* 43*  GLUCOSE 110* 89 93 94 101*  BUN 51* 54* 61* 62* 68*  CREATININE 2.22* 2.28* 2.30* 2.22* 2.39*  CALCIUM 9.1 8.8* 8.7* 8.8* 9.1   Liver Function Tests:  Recent Labs Lab 12/01/15 1130  AST 26  ALT 17  ALKPHOS 100  BILITOT 0.7  PROT 5.6*  ALBUMIN 2.8*   CBC:  Recent Labs Lab 11/27/15 0535 12/01/15 0351 12/03/15 0546  WBC 5.9 5.8 9.1  HGB 9.2* 8.7* 10.0*  HCT 29.5* 27.3* 31.0*  MCV 91.3 90.7 89.6  PLT 128* 134* 183   BNP (last 3 results)  Recent Labs  09/11/15  1726 10/03/15 1259 11/22/15 1308  BNP 1228.8* 662.7* 1370.0*    CBG:  Recent Labs Lab 12/01/15 2103 12/02/15 0550 12/02/15 1131 12/02/15 2143 12/03/15 0701  GLUCAP 122* 97 104* 124* 108*    No results found for this or any previous visit (from the past 240 hour(s)).    Studies/Results: No results found.  Medications:  Scheduled: . acetaZOLAMIDE  500 mg Oral Q12H  . allopurinol  100 mg Oral Daily  . antiseptic oral rinse  7 mL Mouth Rinse BID  . carvedilol  12.5 mg Oral BID WC  . ferrous sulfate  325 mg Oral Q breakfast  . metolazone  2.5 mg Oral BID  . potassium chloride SA  40 mEq Oral BID  . rivaroxaban  15 mg Oral Q1200  . sodium chloride  3 mL Intravenous Q12H  . verapamil  180 mg Oral Daily   Continuous: .  furosemide (LASIX) infusion 20 mg/hr (12/03/15 0435)   ZOX:WRUEAVPRN:sodium chloride, acetaminophen, ondansetron (ZOFRAN) IV, sodium chloride  Assessment/Plan:  Principal Problem:   Acute respiratory failure (HCC) Active Problems:   S/P placement of cardiac pacemaker   HTN (hypertension)   Coronary artery disease, non-occlusive by cath   CHF exacerbation (HCC)   Acute on chronic diastolic heart failure (HCC)   CKD (chronic kidney disease) stage 4, GFR 15-29 ml/min (HCC)   Cardiac pacemaker in situ   Abdominal pain   Pancreatic mass   PAF (paroxysmal atrial fibrillation) (HCC)    Questionable right arm pain versus weakness Etiology remains unclear. There is a history of gout. However, patient is also not moving her right lower extremity. Unclear as to the onset of her symptoms. She is a very poor historian. No facial asymmetry noted. Cannot obtain MRI brain due to pacemaker. Proceed with CT head for now.  Acute respiratory failure due to Acute on chronic diastolic heart failure Patient being followed by heart failure team. Patient has been transitioned to oral diuretics. She is also on metolazone as well as acetazolamide. She appears to be diuresing. Weight is decreasing. Management per cardiology. Strict ins and outs and daily weights.  Pancreatic mass CT abd and pelvis showed a mass evolving the pancreatic head and neck. 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 Cannot perform MRI due to pacemaker. Patient is not currently stable to undergo any further workup in this hospital stay. She will need to be seen by GI as an outpatient to consider EUS.   S/P placement of cardiac pacemaker Stable  Essential HTN (hypertension) Continue Coreg and verapamil. Blood pressures remain stable with diuresis.  Coronary artery disease, non-occlusive by cath Continue ASA. She's asymptomatic.  CKD (chronic kidney disease) stage 4, GFR 15-29 ml/min (HCC) Creatinine stable for the most part. This may be her new  baseline. On admission renal function was 2.4. Back in August 2016 her creatinine was ranging around 1.3-1.5.   Gout Continue on allopurinol.  Iron deficiency anemia: Continue iron sulfate.  DVT Prophylaxis: On xarelto    Code Status: DO NOT RESUSCITATE  Family Communication: Discussed with the patient. No family at bedside  Disposition Plan: Continue current management. Await CT scan of the head. Possible discharge to SNF tomorrow. Unfortunately, patient's stepsister passed away recently and her funeral is on Wednesday. Patient requesting to be present for this. CSW to facilitate if possible , especially as patient will need to be discharged to a skilled nursing facility.    LOS: 11 days   Fairview Developmental CenterKRISHNAN,Loann Chahal  [facility] Pager [phone] 12/03/2015,  8:47 AM  If 7PM-7AM, please contact night-coverage at [email or web address], password [facility]

## 2015-12-03 NOTE — Progress Notes (Addendum)
Advanced Heart Failure Rounding Note  Primary HF: Dr. Shirlee LatchMcLean    Subjective:    Continues to diurese well on Acetazolamide, lasix drip, and metolazone.Weight down another 8 pounds. Weight now 187 (has lost 38 pounds).Bun and Creatinine beginning to bump.   Denies SOB.     Objective:   Weight Range: 85.1 kg (187 lb 9.8 oz) Body mass index is 32.19 kg/(m^2).   Vital Signs:   Temp:  [98.4 F (36.9 C)-98.9 F (37.2 C)] 98.9 F (37.2 C) (12/26 0637) Pulse Rate:  [58-61] 58 (12/26 0637) Resp:  [20] 20 (12/26 0637) BP: (99-120)/(45-51) 120/51 mmHg (12/26 0637) SpO2:  [95 %-100 %] 95 % (12/26 0637) Weight:  [85.1 kg (187 lb 9.8 oz)] 85.1 kg (187 lb 9.8 oz) (12/26 0637) Last BM Date: 11/29/15  Weight change: Filed Weights   12/01/15 0640 12/02/15 0547 12/03/15 0637  Weight: 89.449 kg (197 lb 3.2 oz) 88.724 kg (195 lb 9.6 oz) 85.1 kg (187 lb 9.8 oz)    Intake/Output:   Intake/Output Summary (Last 24 hours) at 12/03/15 1052 Last data filed at 12/03/15 1040  Gross per 24 hour  Intake 1665.67 ml  Output   6825 ml  Net -5159.33 ml     Physical Exam: General: Chronically ill and elderly appearing. NAD. Inbed about to get a bath HEENT: normal except for poor dentition, 02 3 lpm via [place]. Neck: supple. JVP  8-9 Carotids 2+ bilat; no bruits. No thyromegaly or nodule noted.  Cor: PMI nondisplaced. RRR. 2/6 TR, widely split S2.  Lungs: cleardiminished breath sounds Abdomen: Obese soft, non-tender, + mild distention, no HSM. No bruits or masses. Bowel sounds presents x 4.  Extremities: no cyanosis, clubbing, rash, 1+  edema to knees bilaterally.  Neuro: A&Ox3, cranial nerves grossly intact. moves all 4 extremities w/o difficulty. Affect pleasant  Telemetry: AV paced in 60s  Labs: CBC  Recent Labs  12/01/15 0351 12/03/15 0546  WBC 5.8 9.1  HGB 8.7* 10.0*  HCT 27.3* 31.0*  MCV 90.7 89.6  PLT 134* 183   Basic Metabolic Panel  Recent Labs  12/02/15 0305  12/03/15 0546  NA 134* 136  K 3.5 2.9*  CL 82* 81*  CO2 40* 43*  GLUCOSE 94 101*  BUN 62* 68*  CREATININE 2.22* 2.39*  CALCIUM 8.8* 9.1   Liver Function Tests  Recent Labs  12/01/15 1130  AST 26  ALT 17  ALKPHOS 100  BILITOT 0.7  PROT 5.6*  ALBUMIN 2.8*   No results for input(s): LIPASE, AMYLASE in the last 72 hours. Cardiac Enzymes No results for input(s): CKTOTAL, CKMB, CKMBINDEX, TROPONINI in the last 72 hours.  BNP: BNP (last 3 results)  Recent Labs  09/11/15 1726 10/03/15 1259 11/22/15 1308  BNP 1228.8* 662.7* 1370.0*    ProBNP (last 3 results) No results for input(s): PROBNP in the last 8760 hours.   D-Dimer No results for input(s): DDIMER in the last 72 hours. Hemoglobin A1C No results for input(s): HGBA1C in the last 72 hours. Fasting Lipid Panel No results for input(s): CHOL, HDL, LDLCALC, TRIG, CHOLHDL, LDLDIRECT in the last 72 hours. Thyroid Function Tests No results for input(s): TSH, T4TOTAL, T3FREE, THYROIDAB in the last 72 hours.  Invalid input(s): FREET3  Other results:     Imaging/Studies:  No results found.  Latest Echo  Latest Cath   Medications:     Scheduled Medications: . acetaZOLAMIDE  500 mg Oral Q12H  . allopurinol  100 mg Oral Daily  .  antiseptic oral rinse  7 mL Mouth Rinse BID  . carvedilol  12.5 mg Oral BID WC  . ferrous sulfate  325 mg Oral Q breakfast  . metolazone  2.5 mg Oral BID  . potassium chloride SA  40 mEq Oral BID  . rivaroxaban  15 mg Oral Q1200  . sodium chloride  3 mL Intravenous Q12H  . verapamil  180 mg Oral Daily    Infusions: . furosemide (LASIX) infusion 20 mg/hr (12/03/15 0435)    PRN Medications: sodium chloride, acetaminophen, ondansetron (ZOFRAN) IV, sodium chloride   Assessment/Plan   Anita Wiley is a 74 y.o. female with history of diastolic CHF, HTN, gout, and bradycardia s/p Medtronic dual chamber pacemaker in 2010 who presented to [facility] with worsening SOB and pedal  edema. HF team consulted with A/C diastolic CHF.   1. 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   She developed volume overload in the setting of getting the wrong diuretic and dose at a rehab facility recently.  -Diuresing very well on current regimen. Renal function now starting to bump. Weight today 187 - very close to recent baseline of 180-185. Last d/c weight was 188. Will switch to po demadex 40 bid. Would like to see volume status remain stable on po diuretics but she wants to leave by Wednesday for family funeral. 2. Acute on chronic respiratory failure: Improved on 02 via [place]. 3. Acute on CKD stage IV:  --Creatinine up slightly. Switching to po diuretics 4. Hypokalemia, severe: Will supplement 5. 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  6. 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  - Improved with diuresis 7. HTN: BP stable on current meds. 8. PAF: Noted on device interrogation that she has been in and out of afib.  This patient's CHA2DS2-VASc is at least 4. - Continue xarelto 15 mg daily .   9. Large pancreatic mass.   -Unable to get MRI due to PPM. Not candidate for aggressive w/u at this point.   Length of Stay: 11  Arvilla Meres MD  12/03/2015, 10:52 AM  Advanced Heart Failure Team Pager [phone] (M-F; 7a - 4p)  Please contact [facility] for night-coverage after hours (4p -7a ) and weekends on [email or web address]

## 2015-12-03 NOTE — Progress Notes (Signed)
Physical Therapy Treatment Patient Details Name: Anita Wiley [name]: [id number] DOB: 08/25/1941 Today's Date: 12/03/2015    History of Present Illness 74 y.o. female past medical history that includes CHF, hypertension, CAD, presents to the emergency department with chief complaint of gradual worsening shortness of breath and lower extremity edema. Initial evaluation in the emergency department reveals acute respiratory failure related to acute on chronic heart failure    PT Comments    Pt continues to decline in her mobility.  She had significant difficulty just standing and transferring from the bed to the chair today.  She was unable to walk at all (last session she walked 60').  Pt with significant pain in her right arm/hand limiting her ability to use a RW and bil legs when positioning pillow to float heels.  Pt reports h/o gout to me.  RN made aware of change in assist level. Per chart, pt is agreeable to SNF.  Follow Up Recommendations  SNF     Equipment Recommendations  None recommended by PT    Recommendations for Other Services   NA     Precautions / Restrictions Precautions Precautions: Fall    Mobility  Bed Mobility Overal bed mobility: Needs Assistance Bed Mobility: Supine to Sit     Supine to sit: Mod assist;HOB elevated     General bed mobility comments: Mod assist to support trunk, progress legs EOB and help progress hips to side of bed.   Transfers Overall transfer level: Needs assistance Equipment used: Rolling walker (2 wheeled);None Transfers: Sit to/from [employer] Sit to Stand: Mod assist;From elevated surface Stand pivot transfers: Mod assist;From elevated surface       General transfer comment: Pt needed mod assist and multiple attempts both with and without RW.  Ultimately, transfer and standing worked better with RW from elevated bed.  Pt had difficulty grasping RW with right hand/arm due to pain and ultimately leaned on the grip  bar with her forearm until standing.  Premature sit to recliner where therapist had to physically pivot her to land on her sitting surface as she was unable to take a pivotal step around to the chair.   Ambulation/Gait             General Gait Details: unable today          Balance Overall balance assessment: Needs assistance Sitting-balance support: Feet supported;No upper extremity supported Sitting balance-Leahy Scale: Poor   Postural control: Posterior lean Standing balance support: Bilateral upper extremity supported Standing balance-Leahy Scale: Poor                      Cognition Arousal/Alertness: Lethargic Behavior During Therapy: Flat affect Overall Cognitive Status: No family/caregiver present to determine baseline cognitive functioning (difficulty following commands, slow to progress)                      Exercises General Exercises - Lower Extremity Long Arc Quad: Both;10 reps;Seated;AAROM Hip Flexion/Marching: AAROM;Both;10 reps;Seated Toe Raises: AROM;Both;10 reps        Pertinent Vitals/Pain Pain Assessment: Faces Faces Pain Scale: Hurts whole lot Pain Location: with movement/touch of right arm/left ankle.  Pt reports h/o gout Pain Descriptors / Indicators: Grimacing;Guarding Pain Intervention(s): Limited activity within patient's tolerance;Monitored during session;Repositioned;Other (comment) (RN made aware)           PT Goals (current goals can now be found in the care plan section) Acute Rehab PT Goals Patient Stated Goal: none  stated today Progress towards PT goals: Not progressing toward goals - comment (limited by pain and weakness today)    Frequency  Min 2X/week    PT Plan Discharge plan needs to be updated       End of Session   Activity Tolerance: Patient limited by fatigue;Patient limited by pain Patient left: in chair;with call bell/phone within reach;with chair alarm set     Time: [id number] PT Time  Calculation (min) (ACUTE ONLY): 52 min  Charges:  $Therapeutic Exercise: 8-22 mins $Therapeutic Activity: 23-37 mins                      Vivian Okelley B. Joelene Barriere, PT, DPT [phone]   12/03/2015, 11:54 AM

## 2015-12-04 LAB — BASIC METABOLIC PANEL
Anion gap: 16 — ABNORMAL HIGH (ref 5–15)
BUN: 82 mg/dL — ABNORMAL HIGH (ref 6–20)
CALCIUM: 9.2 mg/dL (ref 8.9–10.3)
CO2: 40 mmol/L — AB (ref 22–32)
CREATININE: 2.61 mg/dL — AB (ref 0.44–1.00)
Chloride: 81 mmol/L — ABNORMAL LOW (ref 101–111)
GFR calc non Af Amer: 17 mL/min — ABNORMAL LOW (ref >60)
GFR, EST AFRICAN AMERICAN: 19 mL/min — AB (ref >60)
Glucose, Bld: 116 mg/dL — ABNORMAL HIGH (ref 65–99)
Potassium: 3.2 mmol/L — ABNORMAL LOW (ref 3.5–5.1)
Sodium: 137 mmol/L (ref 135–145)

## 2015-12-04 LAB — GLUCOSE, CAPILLARY: Glucose-Capillary: 116 mg/dL — ABNORMAL HIGH (ref 65–99)

## 2015-12-04 MED ORDER — TORSEMIDE 20 MG PO TABS: 40.0000 mg | ORAL_TABLET | Freq: Two times a day (BID) | ORAL | Status: DC

## 2015-12-04 MED ORDER — TORSEMIDE 20 MG PO TABS
40.0000 mg | ORAL_TABLET | Freq: Two times a day (BID) | ORAL | Status: DC
  Administered 2015-12-05 (×2): 40 mg via ORAL
  Filled 2015-12-04 (×2): qty 2

## 2015-12-04 NOTE — Care Management Important Message (Signed)
Important Message  Patient Details  Name: Anita HaagensenVenna Wiley [name]: [id number] Date of Birth: 02/28/1941   Medicare Important Message Given:  Yes    Anita Wiley Anita Wiley 12/04/2015, 12:10 PM

## 2015-12-04 NOTE — Progress Notes (Signed)
Advanced Heart Failure Rounding Note  Primary HF: Dr. Shirlee LatchMcLean    Subjective:    Continues with transition to po torsemide 12/03/15. Weight down another 10 pounds. Weight now 177 (has lost 48 pounds). BUN and Creatinine continue to trend up. Will hold diuretics today and resume tomorrow.   Denies dizziness, lightheadedness, SOB, or CP.  R arm still bothering her.   Objective:   Weight Range: 177 lb 11.1 oz (80.6 kg) Body mass index is 30.49 kg/(m^2).   Vital Signs:   Temp:  [98.1 F (36.7 C)-99.5 F (37.5 C)] 98.1 F (36.7 C) (12/27 [id number]) Pulse Rate:  [54-75] 75 (12/27 0611) Resp:  [18] 18 (12/27 0611) BP: (108-113)/(42-50) 113/50 mmHg (12/27 0611) SpO2:  [95 %-100 %] 95 % (12/27 0611) Weight:  [177 lb 11.1 oz (80.6 kg)] 177 lb 11.1 oz (80.6 kg) (12/27 0611) Last BM Date: 12/03/15  Weight change: Filed Weights   12/02/15 0547 12/03/15 0637 12/04/15 0611  Weight: 195 lb 9.6 oz (88.724 kg) 187 lb 9.8 oz (85.1 kg) 177 lb 11.1 oz (80.6 kg)    Intake/Output:   Intake/Output Summary (Last 24 hours) at 12/04/15 0810 Last data filed at 12/04/15 0221  Gross per 24 hour  Intake 867.67 ml  Output   2675 ml  Net -1807.33 ml     Physical Exam: General: Chronically ill and elderly appearing. NAD.  HEENT: normal except for poor dentition, 02 3 lpm via [place]. Neck: supple. JVP thick, difficult to assess but appears 6-7 cm. Carotids 2+ bilat; no bruits. No thyromegaly or lymphadenopathy.   Cor: PMI nondisplaced. RRR. 2/6 TR, widely split S2.  Lungs: CTAB, though mildly diminished bases. Abdomen: Obese soft, NT, ND, no HSM. No bruits or masses. +BS  Extremities: no cyanosis, clubbing, rash, no edema. Bracing arm  Neuro: A&Ox3, cranial nerves grossly intact. moves all 4 extremities w/o difficulty. Affect pleasant  Telemetry: AV paced in 60s  Labs: CBC  Recent Labs  12/03/15 0546  WBC 9.1  HGB 10.0*  HCT 31.0*  MCV 89.6  PLT 183   Basic Metabolic Panel  Recent  Labs  41/32/4412/26/16 0546 12/04/15 0514  NA 136 137  K 2.9* 3.2*  CL 81* 81*  CO2 43* 40*  GLUCOSE 101* 116*  BUN 68* 82*  CREATININE 2.39* 2.61*  CALCIUM 9.1 9.2   Liver Function Tests  Recent Labs  12/01/15 1130  AST 26  ALT 17  ALKPHOS 100  BILITOT 0.7  PROT 5.6*  ALBUMIN 2.8*   No results for input(s): LIPASE, AMYLASE in the last 72 hours. Cardiac Enzymes No results for input(s): CKTOTAL, CKMB, CKMBINDEX, TROPONINI in the last 72 hours.  BNP: BNP (last 3 results)  Recent Labs  09/11/15 1726 10/03/15 1259 11/22/15 1308  BNP 1228.8* 662.7* 1370.0*    ProBNP (last 3 results) No results for input(s): PROBNP in the last 8760 hours.   D-Dimer No results for input(s): DDIMER in the last 72 hours. Hemoglobin A1C No results for input(s): HGBA1C in the last 72 hours. Fasting Lipid Panel No results for input(s): CHOL, HDL, LDLCALC, TRIG, CHOLHDL, LDLDIRECT in the last 72 hours. Thyroid Function Tests No results for input(s): TSH, T4TOTAL, T3FREE, THYROIDAB in the last 72 hours.  Invalid input(s): FREET3  Other results:     Imaging/Studies:  Ct Head Wo Contrast  12/03/2015  CLINICAL DATA:  Generalized weakness. EXAM: CT HEAD WITHOUT CONTRAST TECHNIQUE: Contiguous axial images were obtained from the base of the skull through  the vertex without intravenous contrast. COMPARISON:  None. FINDINGS: Brain: No evidence of acute infarction, hemorrhage, extra-axial collection, ventriculomegaly, or mass effect. There is mild brain parenchymal atrophy and chronic small vessel disease changes. Vascular: No hyperdense vessel or unexpected calcification. Skull: Negative for fracture or focal lesion. Sinuses/Orbits: Mild mucosal thickening of the maxillary and ethmoid sinuses bilaterally. Other: None. IMPRESSION: No acute intracranial abnormality. Atrophy, chronic microvascular disease. Mild chronic pansinusitis. Electronically Signed   By: Ted Mcalpine M.D.   On: 12/03/2015  14:00    Latest Echo  Latest Cath   Medications:     Scheduled Medications: . allopurinol  100 mg Oral Daily  . antiseptic oral rinse  7 mL Mouth Rinse BID  . carvedilol  12.5 mg Oral BID WC  . ferrous sulfate  325 mg Oral Q breakfast  . potassium chloride SA  40 mEq Oral BID  . predniSONE  20 mg Oral BID WC  . rivaroxaban  15 mg Oral Q1200  . sodium chloride  3 mL Intravenous Q12H  . torsemide  40 mg Oral BID  . verapamil  180 mg Oral Daily    Infusions:    PRN Medications: sodium chloride, acetaminophen, ondansetron (ZOFRAN) IV, sodium chloride   Assessment/Plan   Rashell Shambaugh is a 74 y.o. female with history of diastolic CHF, HTN, gout, and bradycardia s/p Medtronic dual chamber pacemaker in 2010 who presented to [facility] with worsening SOB and pedal edema. HF team consulted with A/C diastolic CHF.   1. 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   She developed volume overload in the setting of getting the wrong diuretic and dose at a rehab facility recently.  - Creatinine trending up over past few days. Weight today 177 - Below previous baseline of 180-185. Last d/c weight 188.  - Will hold torsemide today and resume tomorrow. She wants to leave by Wednesday for family funeral. 2. Acute on chronic respiratory failure: Improved on 02 via [place]. 3. Acute on CKD stage IV:  -- Creatinine trended up, 2.6 this am.  Will hold torsemide today.  4. Hypokalemia, severe:  -- Continue K supp. Should correct better today with holding of diuretics.  5. 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  6. 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  - Improved with diuresis 7. HTN: BP stable on current meds. 8. PAF: - Noted on device interrogation this admission. CHA2DS2-VASc is at least 4. - Continue xarelto 15 mg daily .   9. Large  pancreatic mass.   - Unable to get MRI due to PPM. Not candidate for aggressive w/u at this point.  10. RUE weakness - Neurology consulted. CT with no acute changes.  ? Related to gout flare and pain in R wrist, now on prednisone.   Length of Stay: [address]  [name] PA-C 12/04/2015, 8:10 AM  Advanced Heart Failure Team Pager [phone] (M-F; 7a - 4p)  Please contact [facility] for night-coverage after hours (4p -7a ) and weekends on [email or web address]  Patient seen and examined with Otilio Saber, PA-C. We discussed all aspects of the encounter. I agree with the assessment and plan as stated above.   She is dry. Renal function slightly worse. Will hold diuretics. If renal function better will start torsemide 40 bid in am. Will need SNF and close f/u in HF Clinic on d/c.  Keenen Roessner,MD 6:36 PM

## 2015-12-04 NOTE — Progress Notes (Signed)
TCT patients brother Lyda JesterCurtis. The funeral for the patient's sister will be tomorrow at 1 pm  [facility]  Address: [address], [place], [place] [id number]  Phone: [phone]  Patient brother stated that the family do not expect the patient to be there for the funeral and do not want to put any added stress on her. The funeral will be taped so that the patient can see the funeral at a later time. Patient updated. Lots of emotional support given. Abelino DerrickB Quinntin Malter [facility]CRN,MHA,BSN [phone]

## 2015-12-04 NOTE — Progress Notes (Signed)
Tresa EndoKelly with [facility] called concerning the possibility of patient attending her sisters funeral tomorrow.  Per [facility], the patient must be discharged from the hospital and be readmitted as a new patient if she returns to the hospital. [facility] will not approve a pass for her to go to the funeral. Abelino DerrickB Luian Schumpert [facility] ServicesRN,MHA,BSN [phone]

## 2015-12-04 NOTE — Clinical Social Work Note (Signed)
Clinical Social Work Assessment  Patient Details  Name: Anita HaagensenVenna Plog [name]: [id number] Date of Birth: 08/15/1941  Date of referral:  12/04/15               Reason for consult:  Facility Placement                Permission sought to share information with:  Family Supports Permission granted to share information::  Yes, Verbal Permission Granted  Name::     Cabin crewKay  Agency::  SNF  Relationship::  sister  Contact Information:     Housing/Transportation Living arrangements for the past 2 months:  Single Family Home Source of Information:  Patient Patient Interpreter Needed:  None Criminal Activity/Legal Involvement Pertinent to Current Situation/Hospitalization:  No - Comment as needed Significant Relationships:  Siblings Lives with:  Self (Stays with sisters or brothers at night) Do you feel safe going back to the place where you live?  Yes (Feels safe and wants to go home but now feels she is weak) Need for family participation in patient care:  No (Coment)  Care giving concerns:  Pt lives alone and does not have 24 hour help at time of DC   Office managerocial Worker assessment / plan:  CSW spoke with pt concerning PT recommendation for SNF.  Pt was somewhat confused and did not seem to follow the conversation.  Sometimes would answer appropriately but then other times would answer questions with irrelevant information.  CSW returned later when family, pt nephew, was at bedside to discuss SNF  Employment status:  Retired Database administratornsurance information:  Managed Medicare PT Recommendations:  Skilled Nursing Facility (HHPT currently recommended as patient wanted to go home.) Information / Referral to community resources:  Skilled Nursing Facility  Patient/Family's Response to care:  Pt did not give clear answer about feeling of SNF.  Pt had just discharged from facility in [place] (pt reports [facility] but records suggest different facility)  Pt then returned home and came back to the hospital the next day.   Pt nephew has lots of concerns about that facility since they DC'd the pt from their care and then she required a 12 day hospital stay.  Minerva Areolaric reports that pt sister Joyce GrossKay is the main person involved in pt care- CSW discussed other SNF options with Joyce GrossKay who would like [place] since [place]- [place] [facility].    Joyce GrossKay is very concerned that pt may be ready for DC tomorrow- states that their sister died recently and her service is tomorrow- does not want to have to worry about pt and the funeral proceedings at the same time.  Patient/Family's Understanding of and Emotional Response to Diagnosis, Current Treatment, and Prognosis:  Pt family is somewhat concerned about trusting SNF with pt health care at this time after poor experience with last rehab facility.  Emotional Assessment Appearance:  Appears stated age Attitude/Demeanor/Rapport:  Inconsistent, Irrational (approrpriate for age and situation. Cooperative, responsive) Affect (typically observed):  Pleasant, Calm, Quiet, Happy Orientation:  Oriented to Self, Oriented to Place, Oriented to  Time, Oriented to Situation Alcohol / Substance use:  Never Used Psych involvement (Current and /or in the community):  No (Comment)  Discharge Needs  Concerns to be addressed:  Care Coordination Readmission within the last 30 days:  No Current discharge risk:  Dependent with Mobility, Lives alone Barriers to Discharge:  Continued Medical Work up   [employer] EnergyHoloman, Aydn Ferrara M, LCSW 12/04/2015, 5:41 PM

## 2015-12-04 NOTE — Progress Notes (Signed)
Pt foley removed at 0830 per Charge RN and nursing protocol. Pt A&O x4; able to get oob with assistance. Will continue to closely monitor. Dionne BucyP. Amo Anisa Leanos RN

## 2015-12-04 NOTE — Progress Notes (Signed)
Pt and family would like SNF placement- Pt sister Joyce GrossKay is helping with decisions and would like [facility].  CSW made ref[place] to [place]Fs- [facility] had gotten a call about pt and are hopeful to admit when medically stable.  CSW will continue to follow- full assessment pending  Merlyn LotJenna Holoman, [facility] HospitalCSWA Clinical Social Worker [phone]

## 2015-12-04 NOTE — Progress Notes (Signed)
CARDIAC REHAB PHASE I   PRE:  Rate/Rhythm: 60 paced  BP:  Sitting: 96/38        SaO2: 100 RA  MODE:  Ambulation: 0 ft   Pt heart monitor reading "leadset unplugged." Unit staff assisted to correct problem. Pt finally back on telemetry monitor. Pt required 2 person assist to stand, upon standing, immediately requested to use bedside commode. Pt assisted to bedside commode, had large bowel movement, states she is too fatigued to walk at this time. Pt assisted to recliner, x2 assist, feet elevated, call bell within reach. Pt is very deconditioned at this time, requires significant assistance to mobilize. Pt will benefit from continued physical therapy/occupational therapy. Will continue to follow as x2 assist.   [id number] Joylene GrapesMonge, Diasha Castleman C, RN, BSN 12/04/2015 10:32 AM

## 2015-12-04 NOTE — Progress Notes (Signed)
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 He was seen by the heart failure team. She required intravenous Lasix infusion. She was also given acetazolamide as well as metolazone. She was very slow to respond. Creatinine has increased today.  Past medical history:  Past Medical History  Diagnosis Date  . Hypertension   . Bradycardia     s/p pacemaker in 2010  . Chronic kidney disease   . Gout   . Pacemaker 2010    [employer] VERSA PACEMAKER...NOT MRI SAFE  . Arthritis     OSTEO  . Gout   . CHF (congestive heart failure) [facility])     Consultants: Cardiology (heart failure team)  Procedures: None  Antibiotics: None  Subjective: Patient denies any complaints. She was able to move her right arm and leg better than yesterday.   Objective: Vital Signs  Filed Vitals:   12/03/15 0637 12/03/15 1308 12/03/15 2209 12/04/15 0611  BP: 120/51 108/42 111/43 113/50  Pulse: 58 54 59 75  Temp: 98.9 F (37.2 C) 99.2 F (37.3 C) 99.5 F (37.5 C) 98.1 F (36.7 C)  TempSrc: Oral Oral Oral Oral  Resp: Height:      Weight: 85.1 kg (187 lb 9.8 oz)   80.6 kg (177 lb 11.1 oz)  SpO2: 95% 100% 95% 95%    Intake/Output Summary (Last 24 hours) at 12/04/15 0839 Last data filed at 12/04/15 0830  Gross per 24 hour  Intake 627.67 ml  Output   3150 ml  Net -2522.33 ml   Filed Weights   12/02/15 0547 12/03/15 0637 12/04/15 0611  Weight: 88.724 kg (195 lb 9.6 oz) 85.1 kg (187 lb 9.8 oz) 80.6 kg (177 lb 11.1 oz)    General appearance: alert, cooperative, appears stated age and no distress Resp: Improved air entry bilaterally. Crackles bilateral bases. No  wheezing, rhonchi. Cardio: regular rate and rhythm, S1, S2 normal, no murmur, click, rub or gallop GI: soft, non-tender; bowel sounds normal; no masses,  no organomegaly Extremities: Edema is present Neurologic: No facial asymmetry. Tongue is midline. Moving her left arm and leg without any problems. Today, patient has improved mobility of the right arm as well as right leg.   Lab Results:  Basic Metabolic Panel:  Recent Labs Lab 11/30/15 0506 12/01/15 0351 12/02/15 0305 12/03/15 0546 12/04/15 0514  NA 134* 134* 134* 136 137  K 3.4* 3.3* 3.5 2.9* 3.2*  CL 86* 85* 82* 81* 81*  CO2 39* 36* 40* 43* 40*  GLUCOSE 89 93 94 101* 116*  BUN 54* 61* 62* 68* 82*  CREATININE 2.28* 2.30* 2.22* 2.39* 2.61*  CALCIUM 8.8* 8.7* 8.8* 9.1 9.2   Liver Function Tests:  Recent Labs Lab 12/01/15 1130  AST 26  ALT 17  ALKPHOS 100  BILITOT 0.7  PROT 5.6*  ALBUMIN 2.8*   CBC:  Recent Labs Lab 12/01/15 0351 12/03/15 0546  WBC 5.8 9.1  HGB 8.7* 10.0*  HCT 27.3* 31.0*  MCV 90.7 89.6  PLT 134* 183   BNP (last 3 results)  Recent Labs  09/11/15 1726 10/03/15 1259 11/22/15 1308  BNP 1228.8* 662.7* 1370.0*    CBG:  Recent Labs Lab 12/02/15 0550 12/02/15 1131  12/02/15 2143 12/03/15 0701 12/04/15 0556  GLUCAP 97 104* 124* 108* 116*    No results found for this or any previous visit (from the past 240 hour(s)).    Studies/Results: Ct Head Wo Contrast  12/03/2015  CLINICAL DATA:  Generalized weakness. EXAM: CT HEAD WITHOUT CONTRAST TECHNIQUE: Contiguous axial images were obtained from the base of the skull through the vertex without intravenous contrast. COMPARISON:  None. FINDINGS: Brain: No evidence of acute infarction, hemorrhage, extra-axial collection, ventriculomegaly, or mass effect. There is mild brain parenchymal atrophy and chronic small vessel disease changes. Vascular: No hyperdense vessel or unexpected calcification. Skull: Negative for fracture or focal lesion.  Sinuses/Orbits: Mild mucosal thickening of the maxillary and ethmoid sinuses bilaterally. Other: None. IMPRESSION: No acute intracranial abnormality. Atrophy, chronic microvascular disease. Mild chronic pansinusitis. Electronically Signed   By: Ted Mcalpineobrinka  Dimitrova M.D.   On: 12/03/2015 14:00    Medications:  Scheduled: . allopurinol  100 mg Oral Daily  . antiseptic oral rinse  7 mL Mouth Rinse BID  . carvedilol  12.5 mg Oral BID WC  . ferrous sulfate  325 mg Oral Q breakfast  . potassium chloride SA  40 mEq Oral BID  . predniSONE  20 mg Oral BID WC  . rivaroxaban  15 mg Oral Q1200  . sodium chloride  3 mL Intravenous Q12H  . torsemide  40 mg Oral BID  . verapamil  180 mg Oral Daily   Continuous:   ZOX:WRUEAVPRN:sodium chloride, acetaminophen, ondansetron (ZOFRAN) IV, sodium chloride  Assessment/Plan:  Principal Problem:   Acute respiratory failure (HCC) Active Problems:   S/P placement of cardiac pacemaker   HTN (hypertension)   Coronary artery disease, non-occlusive by cath   CHF exacerbation (HCC)   Acute on chronic diastolic heart failure (HCC)   CKD (chronic kidney disease) stage 4, GFR 15-29 ml/min (HCC)   Cardiac pacemaker in situ   Abdominal pain   Pancreatic mass   PAF (paroxysmal atrial fibrillation) (HCC)   Right arm weakness    Questionable right arm pain versus weakness/possible acute gout Appreciate neurology input. This does not appear to be a neurological process. Patient's hearing impairment makes it very difficult to obtain any history from her. Today she seems to have improved mobility of both her arm and leg. This was also associated with pain. Hence, gout, is a distinct possibility. She was started on steroids yesterday evening. CT scan of the head did not show any acute abnormalities. MRI cannot be done due to pacemaker. No further neurological workup anticipated.   Acute respiratory failure due to Acute on chronic diastolic heart failure Patient being followed  by heart failure team. Patient has been transitioned to oral diuretics. She is also on metolazone as well as acetazolamide. She appears to be diuresing. Weight is decreasing. Creatinine noted to be higher today. Discussed with the cardiology team. They have held her diuretics for now. Management per cardiology. Strict ins and outs and daily weights.  Pancreatic mass CT abd and pelvis showed a mass evolving the pancreatic head and neck. 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 Cannot perform MRI due to pacemaker. Patient is not currently stable to undergo any further workup in this hospital stay. She will need to be seen by GI as an outpatient to consider EUS.   S/P placement of cardiac pacemaker Stable  Essential HTN (hypertension) Continue Coreg and verapamil. Blood pressures remain stable with diuresis.  Coronary artery disease, non-occlusive by cath Continue ASA. She's asymptomatic.  CKD (chronic  kidney disease) stage 4, GFR 15-29 ml/min (HCC) Creatinine noted to be higher today. Diuretics held by cardiology for now. Recheck tomorrow morning. On admission renal function was 2.4. Back in August 2016 her creatinine was ranging around 1.3-1.5. We may need to tolerate higher creatinine due to need for diuretics.  Gout Continue on allopurinol. See above  Iron deficiency anemia: Continue iron sulfate.  DVT Prophylaxis: On xarelto    Code Status: DO NOT RESUSCITATE  Family Communication: Discussed with the patient. No family at bedside  Disposition Plan: Continue current management. Repeat creatinine tomorrow morning. Further plans per cardiology. Unfortunately, patient's stepsister passed away recently and her funeral is on Wednesday. Patient requesting to be present for this. Unclear if this will be possible, considering patient's continued need for hospitalization. She will eventually need to be discharged to SNF.     LOS: 12 days   [facility]  [facility] Pager  [phone] 12/04/2015, 8:39 AM  If 7PM-7AM, please contact night-coverage at [email or web address], password [facility]

## 2015-12-05 LAB — BASIC METABOLIC PANEL
Anion gap: 14 (ref 5–15)
BUN: 97 mg/dL — ABNORMAL HIGH (ref 6–20)
CALCIUM: 9.1 mg/dL (ref 8.9–10.3)
CO2: 37 mmol/L — AB (ref 22–32)
CREATININE: 2.57 mg/dL — AB (ref 0.44–1.00)
Chloride: 84 mmol/L — ABNORMAL LOW (ref 101–111)
GFR calc non Af Amer: 17 mL/min — ABNORMAL LOW (ref >60)
GFR, EST AFRICAN AMERICAN: 20 mL/min — AB (ref >60)
GLUCOSE: 132 mg/dL — AB (ref 65–99)
Potassium: 3.4 mmol/L — ABNORMAL LOW (ref 3.5–5.1)
Sodium: 135 mmol/L (ref 135–145)

## 2015-12-05 LAB — GLUCOSE, CAPILLARY
GLUCOSE-CAPILLARY: 122 mg/dL — AB (ref 65–99)
Glucose-Capillary: 123 mg/dL — ABNORMAL HIGH (ref 65–99)
Glucose-Capillary: 129 mg/dL — ABNORMAL HIGH (ref 65–99)

## 2015-12-05 MED ORDER — METOLAZONE 2.5 MG PO TABS: ORAL_TABLET | ORAL | Status: AC

## 2015-12-05 MED ORDER — TORSEMIDE 20 MG PO TABS: 40.0000 mg | ORAL_TABLET | Freq: Two times a day (BID) | ORAL | Status: AC

## 2015-12-05 MED ORDER — RIVAROXABAN 15 MG PO TABS: 15.0000 mg | ORAL_TABLET | Freq: Every day | ORAL | Status: AC

## 2015-12-05 MED ORDER — PREDNISONE 20 MG PO TABS: ORAL_TABLET | ORAL | Status: AC

## 2015-12-05 NOTE — Progress Notes (Addendum)
Pt got discharged to [facility], Iv taken off, and tele-monitor DC, discharge package provided to the transport, attempted report twice to the nursing , pt left via ambulance with all of her belongings.

## 2015-12-05 NOTE — Progress Notes (Signed)
  Labs and vitals reviewed. Discussed with Dr. Rito EhrlichKrishnan.  She is stable to go to SNF today from HF perspective on torsemide as prescribed. Would give metolazone 2.5 mg as needed for weight >= 185.   We will see her back in HF Clinic soon.   We will sign off. Appreciate [facility] help.   Bensimhon, Daniel,MD 8:52 AM

## 2015-12-05 NOTE — Progress Notes (Signed)
Pt with significant amount of unmeasured urine upon getting up to bedside commode d/t stress incontinence. This is being charted as an occurrence in addition to each measured amount.

## 2015-12-05 NOTE — Progress Notes (Signed)
CARDIAC REHAB PHASE I   PRE:  Rate/Rhythm: 61 paced    BP: sitting 102/48    SaO2: 100 RA  MODE:  Ambulation: 90 ft   POST:  Rate/Rhythm: 62 pacing    BP: sitting 102/53     SaO2: 97 RA  Pt with mod-max assist x2 with gait belt to stand. Pt seemed to be stronger than yesterday with standing. Once up able to walk with RW with supervision. C/o fatigue and SOB after 50 ft and rested on RW. Return to recliner with VSS. Pt is awaiting her lunch. [id number]  Anita Wiley, Anita Wiley CES, ACSM 12/05/2015 1:12 PM

## 2015-12-05 NOTE — Discharge Summary (Signed)
[facility]  Physician Discharge Summary   Patient ID: Anita Wiley [name]: [id number] DOB/AGE: [birth year] 74 y.o.  Admit date: 11/22/2015 Discharge date: 12/05/2015  PCP: Anita Wiley,Anita Wiley, Anita Wiley  DISCHARGE DIAGNOSES:  Principal Problem:   Acute respiratory failure (HCC) Active Problems:   S/P placement of cardiac pacemaker   HTN (hypertension)   Coronary artery disease, non-occlusive by cath   CHF exacerbation (HCC)   Acute on chronic diastolic heart failure (HCC)   CKD (chronic kidney disease) stage 4, GFR 15-29 ml/min (HCC)   Cardiac pacemaker in situ   Abdominal pain   Pancreatic mass   PAF (paroxysmal atrial fibrillation) (HCC)   Right arm weakness   RECOMMENDATIONS FOR OUTPATIENT FOLLOW UP: Patient will need to be seen by Wiley gastroenterologist regarding her pancreatic mass. Please refer. CBC and BMET on Friday, Dec 30. Results to the Heart Failure clinic at [facility] (Dr. Gala Romney).  DISCHARGE CONDITION: fair  Diet recommendation: Heart healthy  Filed Weights   12/03/15 0637 12/04/15 0611 12/05/15 0455  Weight: 85.1 kg (187 lb 9.8 oz) 80.6 kg (177 lb 11.1 oz) 80.015 kg (176 lb 6.4 oz)    INITIAL HISTORY: 74 year old female with Wiley past medical history of chronic diastolic congestive heart failure, chronic kidney disease stage IV with Wiley pacemaker, presented with shortness of breath and was hospitalized for further management of diastolic CHF. She was seen by the heart failure team. She required intravenous Lasix infusion. She was also given acetazolamide as well as metolazone. Patient improved with these measures.  Consultations: Cardiology (heart failure team)   HOSPITAL COURSE:   Acute respiratory failure due to Acute on chronic diastolic heart failure Patient was admitted to the hospital. She was seen by the heart failure training. She was placed on infusion of furosemide. She was given acetazolamide and metolazone. She started improving. She started diuresing  well. Her weight decreased. Some increase in creatinine is noted, although it's slightly better today. Plan is to continue with oral torsemide. She will need repeat labs at the skilled nursing facility, which will be followed by heart failure team. Continue daily weight measurements. She should be given Wiley dose of metolazone if her weight goes greater than 185 pounds.  Pancreatic mass CT abd and pelvis showed Wiley mass evolving the pancreatic head and neck. 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 Cannot perform MRI due to pacemaker. Patient is not currently stable to undergo any further workup in this hospital stay. She will need to be seen by GI as an outpatient to consider EUS. This was communicated to the patient.  Questionable right arm pain versus weakness/possible acute gout Few days ago, patient was noted to have pain with movement of the right arm. Being Wiley poor historian it was difficult to elicit Wiley proper history from her. There was also some concern for weakness and pain of her right leg as well. Due to her multiple comorbidities Wiley neurological etiology was suspected as well. She underwent Wiley CT scan of the head which was unremarkable. She was seen by neurology who did not feel that her symptoms were neurological in nature. Symptoms were felt to be due to gout. She was given prednisone. She has significantly improved. She is now able to move her right arm and leg without any difficulties. MRI cannot be done due to pacemaker. Steroids will be tapered over the next many days.  S/P placement of cardiac pacemaker Stable. Seen by cardiology.  Essential HTN (hypertension) Continue Coreg and verapamil. Blood pressures remain  stable with diuresis.  Coronary artery disease, non-occlusive by cath/history of paroxysmal atrial fibrillation Continue ASA. She's asymptomatic. Trachea stable. She is on Xarelto. Continue for now. If her renal function continues to get worse, may have to change to alternative  agent.  CKD (chronic kidney disease) stage 4, GFR 15-29 ml/min (HCC) Patient's renal function was being monitored closely. Her creatinine was noted to be higher yesterday. Diuretics were held for 1 day. Creatinine is slightly better today. Okay to resume oral diuretics per cardiology. Will need repeat blood work in the skilled nursing facility. We will need to tolerate Wiley higher level of creatinine. On admission renal function was 2.4. Back in August 2016 her creatinine was ranging around 1.3-1.5.   Gout Continue on allopurinol. Steroid taper as discussed above.  Iron deficiency anemia: Continue iron sulfate.  Overall improved. Okay for discharge to skilled nursing facility.   PERTINENT LABS:  The results of significant diagnostics from this hospitalization (including imaging, microbiology, ancillary and laboratory) are listed below for reference.      Labs: Basic Metabolic Panel:  Recent Labs Lab 12/01/15 0351 12/02/15 0305 12/03/15 0546 12/04/15 0514 12/05/15 0341  NA 134* 134* 136 137 135  K 3.3* 3.5 2.9* 3.2* 3.4*  CL 85* 82* 81* 81* 84*  CO2 36* 40* 43* 40* 37*  GLUCOSE 93 94 101* 116* 132*  BUN 61* 62* 68* 82* 97*  CREATININE 2.30* 2.22* 2.39* 2.61* 2.57*  CALCIUM 8.7* 8.8* 9.1 9.2 9.1   Liver Function Tests:  Recent Labs Lab 12/01/15 1130  AST 26  ALT 17  ALKPHOS 100  BILITOT 0.7  PROT 5.6*  ALBUMIN 2.8*   CBC:  Recent Labs Lab 12/01/15 0351 12/03/15 0546  WBC 5.8 9.1  HGB 8.7* 10.0*  HCT 27.3* 31.0*  MCV 90.7 89.6  PLT 134* 183   BNP: BNP (last 3 results)  Recent Labs  09/11/15 1726 10/03/15 1259 11/22/15 1308  BNP 1228.8* 662.7* 1370.0*    CBG:  Recent Labs Lab 12/02/15 1131 12/02/15 2143 12/03/15 0701 12/04/15 0556 12/05/15 0546  GLUCAP 104* 124* 108* 116* 123*     IMAGING STUDIES Ct Abdomen Pelvis Wo Contrast  11/29/2015  CLINICAL DATA:  Abdominal pain and history of pancreatic mass EXAM: CT ABDOMEN AND PELVIS WITHOUT  CONTRAST TECHNIQUE: Multidetector CT imaging of the abdomen and pelvis was performed following the standard protocol without IV contrast. COMPARISON:  None. FINDINGS: Lower chest and abdominal wall: Anasarca. Cardiomegaly. Trace bilateral pleural effusion. Partly visualized pacer leads in the right heart. Umbilical hernia containing fat and small ascitic fluid. Right inguinal hernia containing ascitic fluid Hepatobiliary: No focal liver abnormality.Layering sludge gallstones. No evidence of acute cholecystitis. Pancreas: Lobulated mass in the pancreatic head and neck with extensive pre pancreatic component. There is peripheral calcifications and central low-density. The mass measures roughly 6 x 4 x 7 cm. 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 There is no evidence of main duct enlargement. Spleen: Unremarkable. Adrenals/Urinary Tract: Negative adrenals. No hydronephrosis or stone. Lobulated renal cortical thinning. Unremarkable bladder. Reproductive:Hysterectomy.  Symmetric adnexae. Stomach/Bowel:  No obstruction. No appendicitis. Vascular/Lymphatic: No acute vascular abnormality. No mass or adenopathy. Peritoneal: No ascites or pneumoperitoneum. Musculoskeletal: No acute abnormalities. Advanced facet arthropathy with grade 1 L4-5 anterolisthesis. IMPRESSION: The reported pancreatic mass measures 6 x 4 x 7 cm and has Wiley cystic appearance. If there is available outside imaging, would gladly make comparison. MRI (without contrast due to renal failure) could determine the internal architecture. The mass is  also amenable to EUS with potential aspiration. Electronically Signed   By: Marnee Spring M.D.   On: 11/29/2015 04:11   Dg Chest 2 View  11/22/2015  CLINICAL DATA:  Shortness of Breath EXAM: CHEST  2 VIEW COMPARISON:  September 20, 2015 FINDINGS: There is no edema or consolidation. Heart is enlarged, stable. Prominence of the main pulmonary outflow tract is also Wiley stable finding. There appears to be normal  tapering of the pulmonary arteries more distally. Pacemaker leads are attached to the right atrium and middle cardiac vein. No pneumothorax. No apparent adenopathy. There is arthropathy in both shoulders as well as degenerative change in the thoracic spine. IMPRESSION: Cardiomegaly. Prominence of the main pulmonary outflow tract potentially may indicate Wiley degree of pulmonary arterial hypertension. No edema or consolidation. Stable pacemaker lead positions. 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 Electronically Signed   By: Bretta Bang III M.D.   On: 11/22/2015 13:49   Ct Head Wo Contrast  12/03/2015  CLINICAL DATA:  Generalized weakness. EXAM: CT HEAD WITHOUT CONTRAST TECHNIQUE: Contiguous axial images were obtained from the base of the skull through the vertex without intravenous contrast. COMPARISON:  None. FINDINGS: Brain: No evidence of acute infarction, hemorrhage, extra-axial collection, ventriculomegaly, or mass effect. There is mild brain parenchymal atrophy and chronic small vessel disease changes. Vascular: No hyperdense vessel or unexpected calcification. Skull: Negative for fracture or focal lesion. Sinuses/Orbits: Mild mucosal thickening of the maxillary and ethmoid sinuses bilaterally. Other: None. IMPRESSION: No acute intracranial abnormality. Atrophy, chronic microvascular disease. Mild chronic pansinusitis. Electronically Signed   By: Ted Mcalpine M.D.   On: 12/03/2015 14:00    DISCHARGE EXAMINATION: Filed Vitals:   12/04/15 1100 12/04/15 2127 12/05/15 0455 12/05/15 0938  BP: 116/47 114/53 117/66 100/50  Pulse: 60 62 82 60  Temp: 97.8 F (36.6 C) 97.8 F (36.6 C) 98.4 F (36.9 C)   TempSrc: Oral Oral Oral   Resp: Height:      Weight:   80.015 kg (176 lb 6.4 oz)   SpO2: 98% 97% 98% 95%   General appearance: alert, cooperative, appears stated age and no distress Cardio: regular rate and rhythm, S1, S2 normal, no murmur, click, rub or gallop GI: soft,  non-tender; bowel sounds normal; no masses,  no organomegaly Extremities: Minimal edema  DISPOSITION: SNF  Discharge Instructions    (HEART FAILURE PATIENTS) Call Anita Wiley:  Anytime you have any of the following symptoms: 1) 3 pound weight gain in 24 hours or 5 pounds in 1 week 2) shortness of breath, with or without Wiley dry hacking cough 3) swelling in the hands, feet or stomach 4) if you have to sleep on extra pillows at night in order to breathe.    Complete by:  As directed      Call Anita Wiley for:  difficulty breathing, headache or visual disturbances    Complete by:  As directed      Call Anita Wiley for:  extreme fatigue    Complete by:  As directed      Call Anita Wiley for:  persistant dizziness or light-headedness    Complete by:  As directed      Call Anita Wiley for:  persistant nausea and vomiting    Complete by:  As directed      Call Anita Wiley for:  severe uncontrolled pain    Complete by:  As directed      Call Anita Wiley for:  temperature >100.4    Complete by:  As  directed      Diet - low sodium heart healthy    Complete by:  As directed      Discharge instructions    Complete by:  As directed   CBC and BMET on Friday, Dec 30. Results to the Heart Failure clinic at [facility] (Dr. Gala Wiley).   You were cared for by Wiley hospitalist during your hospital stay. If you have any questions about your discharge medications or the care you received while you were in the hospital after you are discharged, you can call the unit and asked to speak with the hospitalist on call if the hospitalist that took care of you is not available. Once you are discharged, your primary care physician will handle any further medical issues. Please note that NO REFILLS for any discharge medications will be authorized once you are discharged, as it is imperative that you return to your primary care physician (or establish Wiley relationship with Wiley primary care physician if you do not have one) for your aftercare needs so that they can reassess your need for  medications and monitor your lab values. If you do not have Wiley primary care physician, you can call [phone] for Wiley physician referral.     Increase activity slowly    Complete by:  As directed            ALLERGIES:  Allergies  Allergen Reactions  . Codeine Itching     Current Discharge Medication List    START taking these medications   Details  metolazone (ZAROXOLYN) 2.5 MG tablet Take one tablet for weight >= 185. Qty: 30 tablet    predniSONE (DELTASONE) 20 MG tablet Take one tablet twice daily for 3 days, then one tablet once daily for 4 days, then STOP.    Rivaroxaban (XARELTO) 15 MG TABS tablet Take 1 tablet (15 mg total) by mouth daily at 12 noon. Qty: 42 tablet      CONTINUE these medications which have CHANGED   Details  torsemide (DEMADEX) 20 MG tablet Take 2 tablets (40 mg total) by mouth 2 (two) times daily. Qty: 120 tablet, Refills: 11      CONTINUE these medications which have NOT CHANGED   Details  allopurinol (ZYLOPRIM) 100 MG tablet Take 100 mg by mouth daily.    aspirin 81 MG tablet Take 81 mg by mouth daily.    carvedilol (COREG) 12.5 MG tablet Take 1 tablet (12.5 mg total) by mouth 2 (two) times daily with Wiley meal. Qty: 60 tablet, Refills: 0    ferrous sulfate 325 (65 FE) MG tablet Take 325 mg by mouth daily with breakfast.    Potassium Chloride ER 20 MEQ TBCR Take 40 mEq by mouth 2 (two) times daily. Qty: 120 tablet, Refills: 3    verapamil (CALAN-SR) 180 MG CR tablet Take 180 mg by mouth daily. Refills: 5       Follow-up Information    Schedule an appointment as soon as possible for Wiley visit with Anita Wiley,Anita Wiley, Anita Wiley.   Specialty:  Internal Medicine   Why:  post hospitalization follow up   Contact information:   [address] [place] [id number]       Follow up with Anita Anconaalton McLean, Anita Wiley On 12/17/2015.   Specialty:  Cardiology   Why:  at 220 for post hospital follow up. Please bring all of your medications with you to your visit.  The code  for pt parking is 1000.   Contact information:  [address] [place] [address] [phone]       TOTAL DISCHARGE TIME: 35 minutes  [facility]  [facility] Pager [phone]  12/05/2015, 11:27 AM

## 2015-12-05 NOTE — Progress Notes (Signed)
Patient will discharge to [facility] Anticipated discharge date:12/28 Family notified: unit CSW left several messages for family- pt alert/oriented and agreeable to transfer  Transportation by [facility]- called at 4:05pm  CSW signing off.  Merlyn LotJenna Holoman, LCSWA Clinical Social Worker [phone]

## 2015-12-17 ENCOUNTER — Inpatient Hospital Stay (HOSPITAL_COMMUNITY): Payer: Medicare Other

## 2016-08-31 IMAGING — CR DG CHEST 2V
2 series · 2 of 2 positions shown · non-contrast
Comparison: September 20, 2015

CLINICAL DATA: Shortness of Breath

EXAM:
CHEST  2 VIEW

[chest pa]
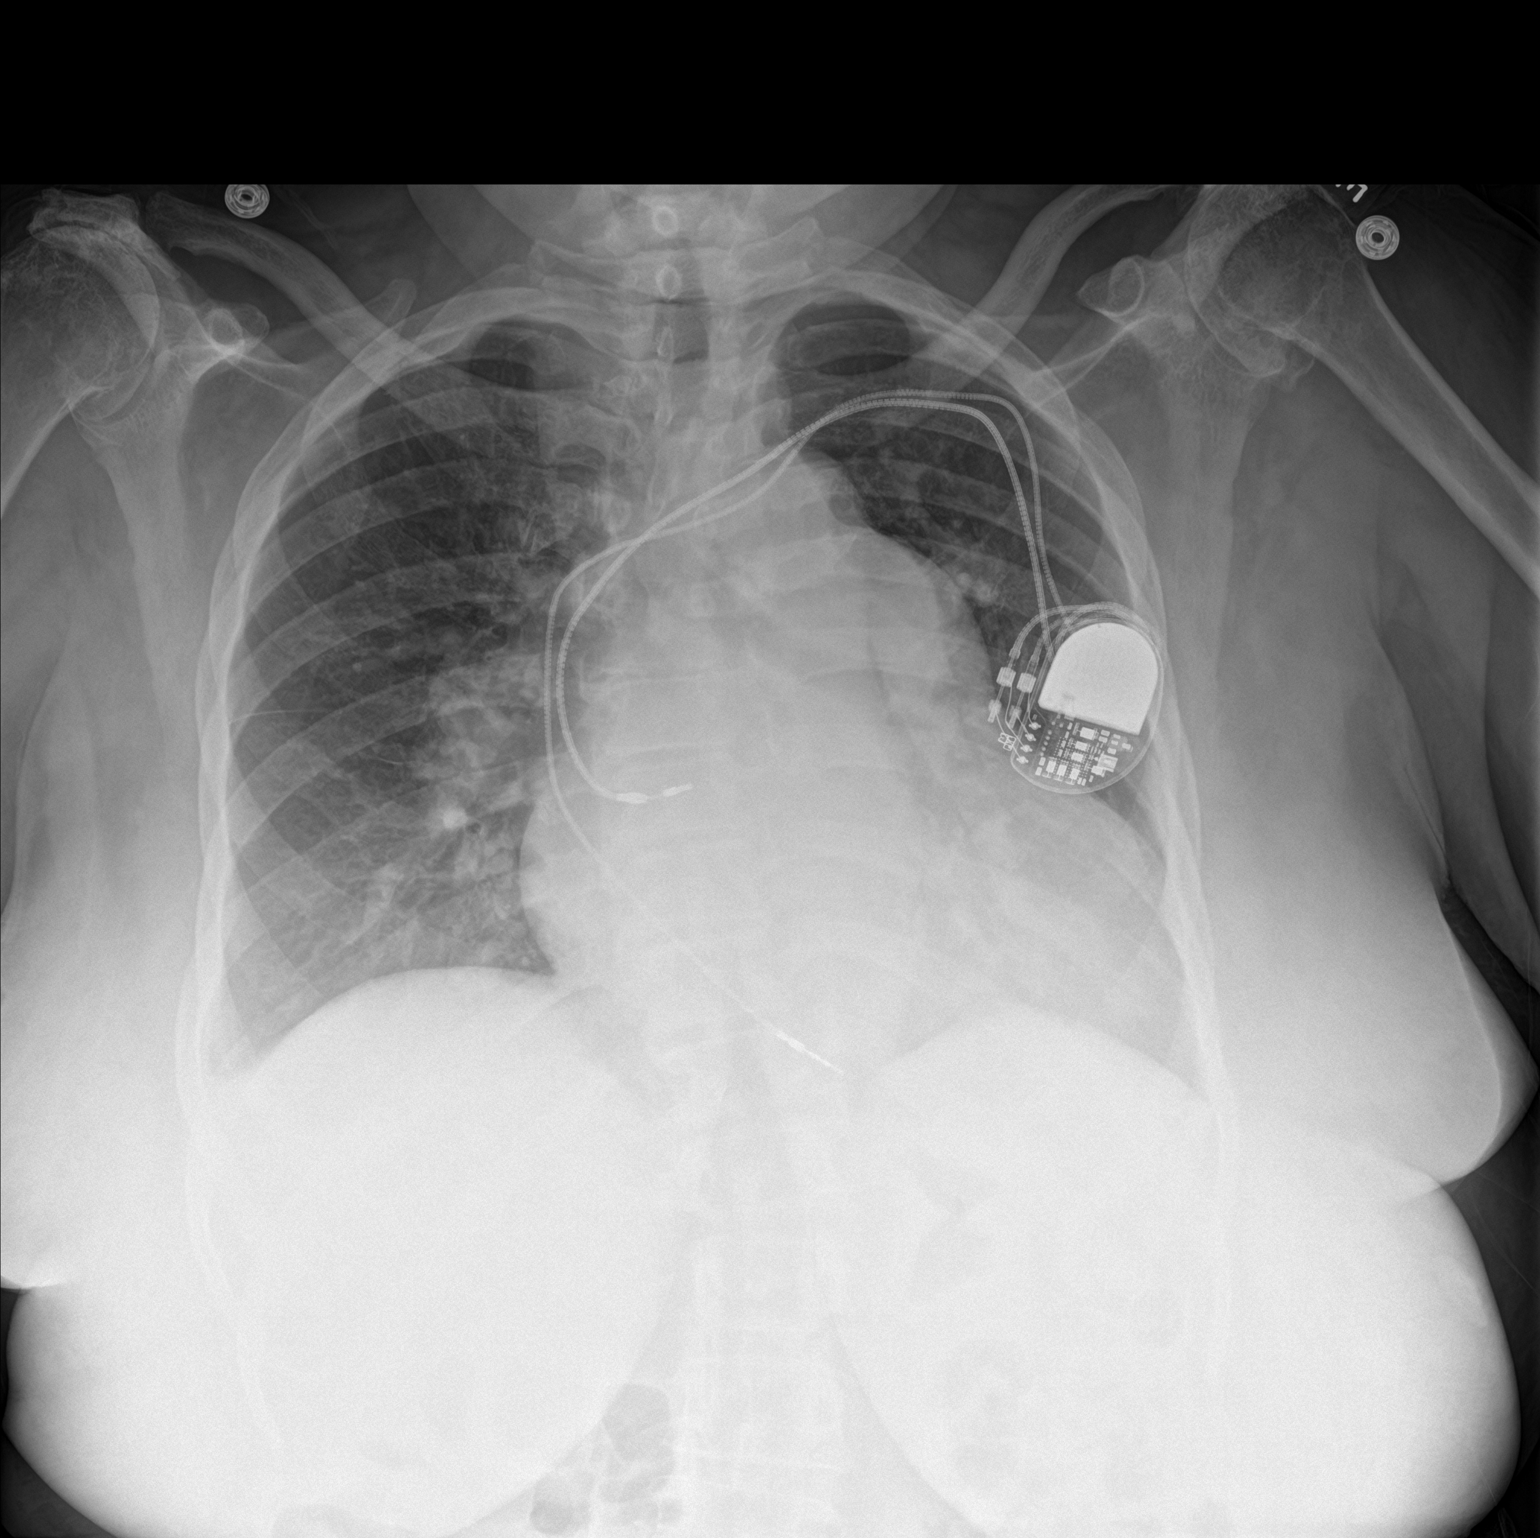

[chest lat]
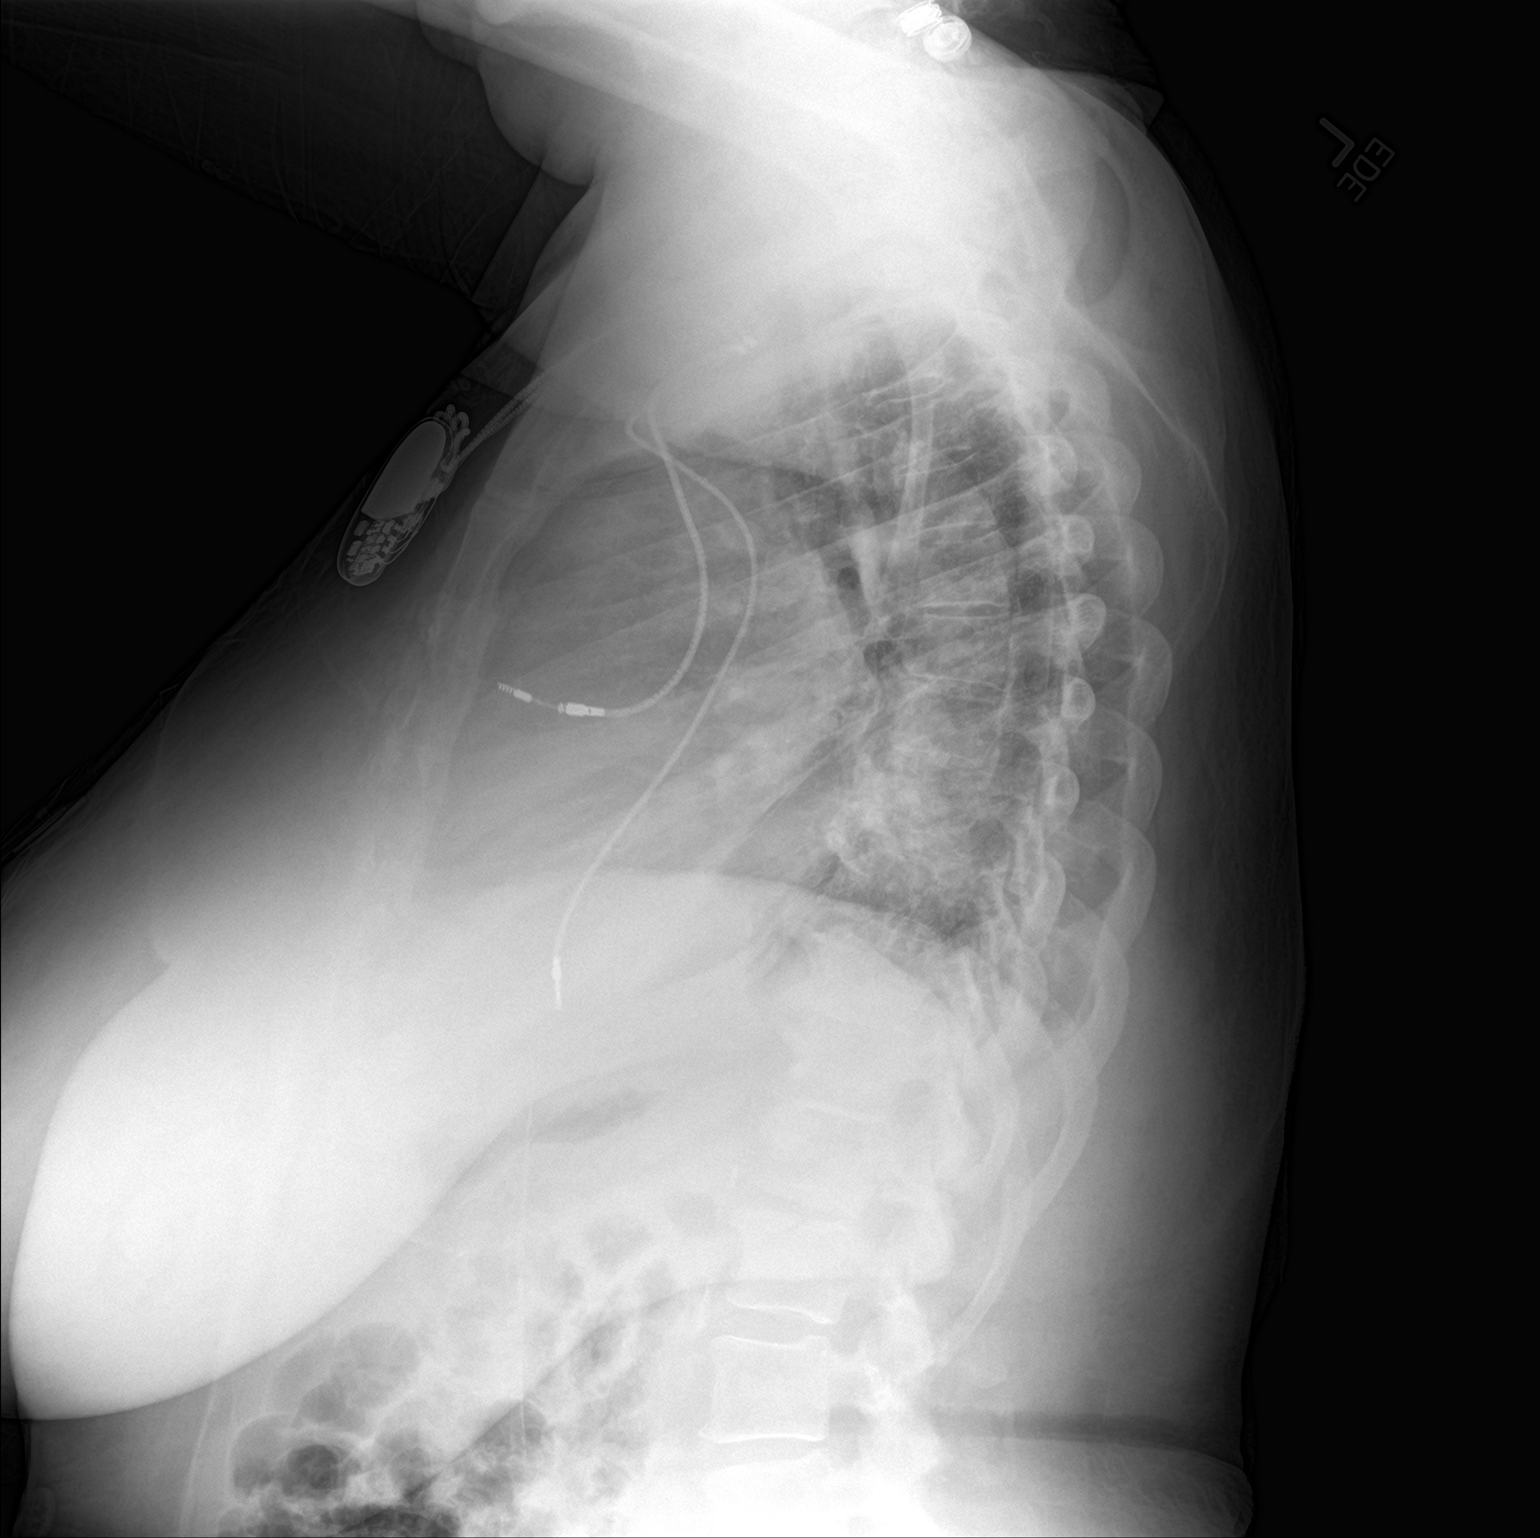

[2 of 2 positions shown; findings below may reference images not displayed]

FINDINGS: There is no edema or consolidation. Heart is enlarged, stable.
Prominence of the main pulmonary outflow tract is also a stable
finding. There appears to be normal tapering of the pulmonary
arteries more distally. Pacemaker leads are attached to the right
atrium and middle cardiac vein. No pneumothorax. No apparent
adenopathy. There is arthropathy in both shoulders as well as
degenerative change in the thoracic spine.
IMPRESSION: Cardiomegaly. Prominence of the main pulmonary outflow tract
potentially may indicate a degree of pulmonary arterial
hypertension. No edema or consolidation. Stable pacemaker lead
positions. Extensive arthropathy in both shoulders.

## 2017-02-05 DEATH — deceased
